# Patient Record
Sex: Male | Born: 1950 | Race: White | Hispanic: Yes | Marital: Married | Smoking: Never smoker
Health system: Southern US, Community
[De-identification: ages and names within clinical notes are randomized; demographics above are authoritative.]

## PROBLEM LIST (undated history)

## (undated) DIAGNOSIS — I209 Angina pectoris, unspecified: Secondary | ICD-10-CM

## (undated) DIAGNOSIS — F410 Panic disorder [episodic paroxysmal anxiety] without agoraphobia: Secondary | ICD-10-CM

## (undated) DIAGNOSIS — K449 Diaphragmatic hernia without obstruction or gangrene: Secondary | ICD-10-CM

## (undated) DIAGNOSIS — K760 Fatty (change of) liver, not elsewhere classified: Secondary | ICD-10-CM

## (undated) DIAGNOSIS — G4730 Sleep apnea, unspecified: Secondary | ICD-10-CM

## (undated) DIAGNOSIS — M25559 Pain in unspecified hip: Secondary | ICD-10-CM

## (undated) DIAGNOSIS — K759 Inflammatory liver disease, unspecified: Secondary | ICD-10-CM

## (undated) DIAGNOSIS — J3089 Other allergic rhinitis: Secondary | ICD-10-CM

## (undated) DIAGNOSIS — J189 Pneumonia, unspecified organism: Secondary | ICD-10-CM

## (undated) DIAGNOSIS — F4312 Post-traumatic stress disorder, chronic: Secondary | ICD-10-CM

## (undated) DIAGNOSIS — E785 Hyperlipidemia, unspecified: Secondary | ICD-10-CM

## (undated) DIAGNOSIS — F4310 Post-traumatic stress disorder, unspecified: Secondary | ICD-10-CM

## (undated) DIAGNOSIS — T8859XA Other complications of anesthesia, initial encounter: Secondary | ICD-10-CM

## (undated) DIAGNOSIS — Z87442 Personal history of urinary calculi: Secondary | ICD-10-CM

## (undated) DIAGNOSIS — F32A Depression, unspecified: Secondary | ICD-10-CM

## (undated) DIAGNOSIS — M549 Dorsalgia, unspecified: Secondary | ICD-10-CM

## (undated) DIAGNOSIS — H409 Unspecified glaucoma: Secondary | ICD-10-CM

## (undated) DIAGNOSIS — F329 Major depressive disorder, single episode, unspecified: Secondary | ICD-10-CM

## (undated) DIAGNOSIS — F40240 Claustrophobia: Secondary | ICD-10-CM

## (undated) DIAGNOSIS — M1990 Unspecified osteoarthritis, unspecified site: Secondary | ICD-10-CM

## (undated) DIAGNOSIS — I499 Cardiac arrhythmia, unspecified: Secondary | ICD-10-CM

## (undated) DIAGNOSIS — G4733 Obstructive sleep apnea (adult) (pediatric): Secondary | ICD-10-CM

## (undated) DIAGNOSIS — K219 Gastro-esophageal reflux disease without esophagitis: Secondary | ICD-10-CM

## (undated) DIAGNOSIS — E119 Type 2 diabetes mellitus without complications: Secondary | ICD-10-CM

## (undated) DIAGNOSIS — K7460 Unspecified cirrhosis of liver: Secondary | ICD-10-CM

## (undated) DIAGNOSIS — R42 Dizziness and giddiness: Secondary | ICD-10-CM

## (undated) DIAGNOSIS — Z8739 Personal history of other diseases of the musculoskeletal system and connective tissue: Secondary | ICD-10-CM

## (undated) DIAGNOSIS — I480 Paroxysmal atrial fibrillation: Secondary | ICD-10-CM

## (undated) DIAGNOSIS — M25569 Pain in unspecified knee: Secondary | ICD-10-CM

## (undated) DIAGNOSIS — R0600 Dyspnea, unspecified: Secondary | ICD-10-CM

## (undated) HISTORY — PX: BACK SURGERY: SHX140

## (undated) HISTORY — PX: REPLACEMENT TOTAL KNEE: SUR1224

## (undated) HISTORY — PX: BONE MARROW TRANSPLANT: SHX200

## (undated) HISTORY — PX: JOINT REPLACEMENT: SHX530

---
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
## 2018-01-15 ENCOUNTER — Encounter
Admission: RE | Admit: 2018-01-15 | Discharge: 2018-01-15 | Disposition: A | Discharge status: Home / Self Care | Payer: Medicare Other | Source: Ambulatory Visit | Attending: Orthopedic Surgery | Admitting: Orthopedic Surgery

## 2018-01-15 ENCOUNTER — Other Ambulatory Visit: Payer: Self-pay

## 2018-01-15 DIAGNOSIS — R9431 Abnormal electrocardiogram [ECG] [EKG]: Secondary | ICD-10-CM | POA: Diagnosis not present

## 2018-01-15 DIAGNOSIS — M1611 Unilateral primary osteoarthritis, right hip: Secondary | ICD-10-CM | POA: Diagnosis not present

## 2018-01-15 DIAGNOSIS — Z01818 Encounter for other preprocedural examination: Secondary | ICD-10-CM | POA: Insufficient documentation

## 2018-01-15 DIAGNOSIS — I4510 Unspecified right bundle-branch block: Secondary | ICD-10-CM | POA: Diagnosis not present

## 2018-01-15 DIAGNOSIS — E119 Type 2 diabetes mellitus without complications: Secondary | ICD-10-CM | POA: Diagnosis not present

## 2018-01-15 HISTORY — DX: Dorsalgia, unspecified: M54.9

## 2018-01-15 HISTORY — DX: Claustrophobia: F40.240

## 2018-01-15 HISTORY — DX: Unspecified glaucoma: H40.9

## 2018-01-15 HISTORY — DX: Type 2 diabetes mellitus without complications: E11.9

## 2018-01-15 HISTORY — DX: Dyspnea, unspecified: R06.00

## 2018-01-15 HISTORY — DX: Gastro-esophageal reflux disease without esophagitis: K21.9

## 2018-01-15 HISTORY — DX: Hyperlipidemia, unspecified: E78.5

## 2018-01-15 HISTORY — DX: Pain in unspecified knee: M25.569

## 2018-01-15 HISTORY — DX: Pain in unspecified hip: M25.559

## 2018-01-15 HISTORY — DX: Sleep apnea, unspecified: G47.30

## 2018-01-15 HISTORY — DX: Angina pectoris, unspecified: I20.9

## 2018-01-15 HISTORY — DX: Other allergic rhinitis: J30.89

## 2018-01-15 HISTORY — DX: Diaphragmatic hernia without obstruction or gangrene: K44.9

## 2018-01-15 LAB — URINALYSIS, COMPLETE (UACMP) WITH MICROSCOPIC
BILIRUBIN URINE: NEGATIVE
Bacteria, UA: NONE SEEN
Glucose, UA: NEGATIVE mg/dL
Hgb urine dipstick: NEGATIVE
KETONES UR: NEGATIVE mg/dL
LEUKOCYTES UA: NEGATIVE
Nitrite: NEGATIVE
PROTEIN: NEGATIVE mg/dL
Specific Gravity, Urine: 1.023 (ref 1.005–1.030)
pH: 6 (ref 5.0–8.0)

## 2018-01-15 LAB — CBC
HCT: 44.5 % (ref 39.0–52.0)
Hemoglobin: 15 g/dL (ref 13.0–17.0)
MCH: 30.7 pg (ref 26.0–34.0)
MCHC: 33.7 g/dL (ref 30.0–36.0)
MCV: 91 fL (ref 80.0–100.0)
PLATELETS: 224 10*3/uL (ref 150–400)
RBC: 4.89 MIL/uL (ref 4.22–5.81)
RDW: 13 % (ref 11.5–15.5)
WBC: 6.9 10*3/uL (ref 4.0–10.5)
nRBC: 0 % (ref 0.0–0.2)

## 2018-01-15 LAB — BASIC METABOLIC PANEL
Anion gap: 10 (ref 5–15)
BUN: 14 mg/dL (ref 8–23)
CALCIUM: 9.3 mg/dL (ref 8.9–10.3)
CO2: 26 mmol/L (ref 22–32)
CREATININE: 0.83 mg/dL (ref 0.61–1.24)
Chloride: 102 mmol/L (ref 98–111)
GFR calc Af Amer: >60 mL/min (ref >60)
GFR calc non Af Amer: >60 mL/min (ref >60)
GLUCOSE: 116 mg/dL — AB (ref 70–99)
Potassium: 3.6 mmol/L (ref 3.5–5.1)
SODIUM: 138 mmol/L (ref 135–145)

## 2018-01-15 LAB — TYPE AND SCREEN
ABO/RH(D): A POS
Antibody Screen: NEGATIVE

## 2018-01-15 LAB — SURGICAL PCR SCREEN
MRSA, PCR: NEGATIVE
STAPHYLOCOCCUS AUREUS: NEGATIVE

## 2018-01-15 LAB — PROTIME-INR
INR: 0.98
Prothrombin Time: 12.9 seconds (ref 11.4–15.2)

## 2018-01-15 LAB — SEDIMENTATION RATE: SED RATE: 27 mm/h — AB (ref 0–20)

## 2018-01-15 LAB — APTT: aPTT: 34 seconds (ref 24–36)

## 2018-01-15 NOTE — Patient Instructions (Signed)
Your procedure is scheduled on: 01/26/18 Tues Report to Same Day Surgery 2nd floor medical mall [facility] Entrance-take elevator on left to 2nd floor.  Check in with surgery information desk.) To find out your arrival time please call [phone] between 1PM - 3PM on 01/25/18 Mon  Remember: Instructions that are not followed completely may result in serious medical risk, up to and including death, or upon the discretion of your surgeon and anesthesiologist your surgery may need to be rescheduled.    _x___ 1. Do not eat food after midnight the night before your procedure. You may drink clear liquids up to 2 hours before you are scheduled to arrive at the hospital for your procedure.  Do not drink clear liquids within 2 hours of your scheduled arrival to the hospital.  Clear liquids include  --Water or Apple juice without pulp  --Clear carbohydrate beverage such as ClearFast or Gatorade  --Black Coffee or Clear Tea (No milk, no creamers, do not add anything to                  the coffee or Tea Type 1 and type 2 diabetics should only drink water.   ____Ensure clear carbohydrate drink on the way to the hospital for bariatric patients  ____Ensure clear carbohydrate drink 3 hours before surgery for Dr Dwyane Luo patients if physician instructed.   No gum chewing or hard candies.     __x__ 2. No Alcohol for 24 hours before or after surgery.   __x__3. No Smoking or e-cigarettes for 24 prior to surgery.  Do not use any chewable tobacco products for at least 6 hour prior to surgery   ____  4. Bring all medications with you on the day of surgery if instructed.    __x__ 5. Notify your doctor if there is any change in your medical condition     (cold, fever, infections).    x___6. On the morning of surgery brush your teeth with toothpaste and water.  You may rinse your mouth with mouth wash if you wish.  Do not swallow any toothpaste or mouthwash.   Do not wear jewelry, make-up, hairpins,  clips or nail polish.  Do not wear lotions, powders, or perfumes. You may wear deodorant.  Do not shave 48 hours prior to surgery. Men may shave face and neck.  Do not bring valuables to the hospital.    [facility] is not responsible for any belongings or valuables.               Contacts, dentures or bridgework may not be worn into surgery.  Leave your suitcase in the car. After surgery it may be brought to your room.  For patients admitted to the hospital, discharge time is determined by your                       treatment team.  _  Patients discharged the day of surgery will not be allowed to drive home.  You will need someone to drive you home and stay with you the night of your procedure.    Please read over the following fact sheets that you were given:   [facility] Preparing for Surgery and or MRSA Information   _x___ Take anti-hypertensive listed below, cardiac, seizure, asthma,     anti-reflux and psychiatric medicines. These include:  1. None  2.  3.  4.  5.  6.  ____Fleets enema or Magnesium Citrate as directed.  _x___ Use CHG Soap or sage wipes as directed on instruction sheet   ____ Use inhalers on the day of surgery and bring to hospital day of surgery  __x__ Stop Metformin and Janumet 2 days prior to surgery.    ____ Take 1/2 of usual insulin dose the night before surgery and none on the morning     surgery.   _x___ Follow recommendations from Cardiologist, Pulmonologist or PCP regarding          stopping Aspirin, Coumadin, Plavix ,Eliquis, Effient, or Pradaxa, and Pletal.  X____Stop Anti-inflammatories such as Advil, Aleve, Ibuprofen, Motrin, Naproxen, Naprosyn, Goodies powders or aspirin products. OK to take Tylenol and                          Celebrex.   _x___ Stop supplements until after surgery.  But may continue Vitamin D, Vitamin B,       and multivitamin.   __x__ Bring C-Pap to the hospital.

## 2018-01-18 LAB — URINE CULTURE: Culture: 40000 — AB

## 2018-01-19 NOTE — Pre-Procedure Instructions (Signed)
EKG OK BY DR Ola Spurr 01/18/18

## 2018-01-25 MED ORDER — DEXTROSE 5 % IV SOLN
3.0000 g | Freq: Once | INTRAVENOUS | Status: AC
  Administered 2018-01-26: 3 g via INTRAVENOUS
  Filled 2018-01-25: qty 3

## 2018-01-25 MED ORDER — TRANEXAMIC ACID-NACL 1000-0.7 MG/100ML-% IV SOLN
1000.0000 mg | INTRAVENOUS | Status: AC
  Administered 2018-01-26: 1000 mg via INTRAVENOUS
  Filled 2018-01-25: qty 100

## 2018-01-26 ENCOUNTER — Inpatient Hospital Stay: Payer: Medicare Other

## 2018-01-26 ENCOUNTER — Other Ambulatory Visit: Payer: Self-pay

## 2018-01-26 ENCOUNTER — Inpatient Hospital Stay
Admission: RE | Admit: 2018-01-26 | Discharge: 2018-01-29 | DRG: 470 | Disposition: A | Discharge status: Home Health | Payer: Medicare Other | Attending: Orthopedic Surgery | Admitting: Orthopedic Surgery

## 2018-01-26 ENCOUNTER — Encounter
Admission: RE | Disposition: A | Discharge status: Home Health | Payer: Self-pay | Source: Home / Self Care | Attending: Orthopedic Surgery

## 2018-01-26 ENCOUNTER — Other Ambulatory Visit: Payer: Self-pay | Admitting: Gastroenterology

## 2018-01-26 ENCOUNTER — Inpatient Hospital Stay: Payer: Medicare Other | Admitting: Anesthesiology

## 2018-01-26 DIAGNOSIS — F329 Major depressive disorder, single episode, unspecified: Secondary | ICD-10-CM | POA: Diagnosis present

## 2018-01-26 DIAGNOSIS — Z96641 Presence of right artificial hip joint: Secondary | ICD-10-CM | POA: Diagnosis present

## 2018-01-26 DIAGNOSIS — Z419 Encounter for procedure for purposes other than remedying health state, unspecified: Secondary | ICD-10-CM

## 2018-01-26 DIAGNOSIS — R1084 Generalized abdominal pain: Secondary | ICD-10-CM

## 2018-01-26 DIAGNOSIS — Z23 Encounter for immunization: Secondary | ICD-10-CM

## 2018-01-26 DIAGNOSIS — R509 Fever, unspecified: Secondary | ICD-10-CM | POA: Diagnosis not present

## 2018-01-26 DIAGNOSIS — Z881 Allergy status to other antibiotic agents status: Secondary | ICD-10-CM | POA: Diagnosis not present

## 2018-01-26 DIAGNOSIS — F419 Anxiety disorder, unspecified: Secondary | ICD-10-CM | POA: Diagnosis present

## 2018-01-26 DIAGNOSIS — M1611 Unilateral primary osteoarthritis, right hip: Principal | ICD-10-CM | POA: Diagnosis present

## 2018-01-26 DIAGNOSIS — K219 Gastro-esophageal reflux disease without esophagitis: Secondary | ICD-10-CM | POA: Diagnosis present

## 2018-01-26 DIAGNOSIS — F40240 Claustrophobia: Secondary | ICD-10-CM | POA: Diagnosis present

## 2018-01-26 DIAGNOSIS — Z87442 Personal history of urinary calculi: Secondary | ICD-10-CM

## 2018-01-26 DIAGNOSIS — H409 Unspecified glaucoma: Secondary | ICD-10-CM | POA: Diagnosis present

## 2018-01-26 DIAGNOSIS — E119 Type 2 diabetes mellitus without complications: Secondary | ICD-10-CM | POA: Diagnosis present

## 2018-01-26 DIAGNOSIS — R110 Nausea: Secondary | ICD-10-CM

## 2018-01-26 DIAGNOSIS — K5909 Other constipation: Secondary | ICD-10-CM

## 2018-01-26 DIAGNOSIS — Z8042 Family history of malignant neoplasm of prostate: Secondary | ICD-10-CM

## 2018-01-26 DIAGNOSIS — E662 Morbid (severe) obesity with alveolar hypoventilation: Secondary | ICD-10-CM | POA: Diagnosis present

## 2018-01-26 DIAGNOSIS — Z6841 Body Mass Index (BMI) 40.0 and over, adult: Secondary | ICD-10-CM | POA: Diagnosis not present

## 2018-01-26 DIAGNOSIS — Z833 Family history of diabetes mellitus: Secondary | ICD-10-CM | POA: Diagnosis not present

## 2018-01-26 DIAGNOSIS — G8918 Other acute postprocedural pain: Secondary | ICD-10-CM

## 2018-01-26 DIAGNOSIS — Z79899 Other long term (current) drug therapy: Secondary | ICD-10-CM | POA: Diagnosis not present

## 2018-01-26 DIAGNOSIS — Z8249 Family history of ischemic heart disease and other diseases of the circulatory system: Secondary | ICD-10-CM

## 2018-01-26 DIAGNOSIS — Z7984 Long term (current) use of oral hypoglycemic drugs: Secondary | ICD-10-CM | POA: Diagnosis not present

## 2018-01-26 HISTORY — PX: TOTAL HIP ARTHROPLASTY: SHX124

## 2018-01-26 LAB — GLUCOSE, CAPILLARY
Glucose-Capillary: 140 mg/dL — ABNORMAL HIGH (ref 70–99)
Glucose-Capillary: 132 mg/dL — ABNORMAL HIGH (ref 70–99)
Glucose-Capillary: 122 mg/dL — ABNORMAL HIGH (ref 70–99)
Glucose-Capillary: 217 mg/dL — ABNORMAL HIGH (ref 70–99)

## 2018-01-26 LAB — ABO/RH: ABO/RH(D): A POS

## 2018-01-26 IMAGING — DX DG HIP (WITH OR WITHOUT PELVIS) 2-3V*R*
2 series · 2 of 2 positions shown · non-contrast
Comparison: None.

CLINICAL DATA: Status post right hip replacement.

EXAM:
DG HIP (WITH OR WITHOUT PELVIS) 2-3V RIGHT

[hip ap]
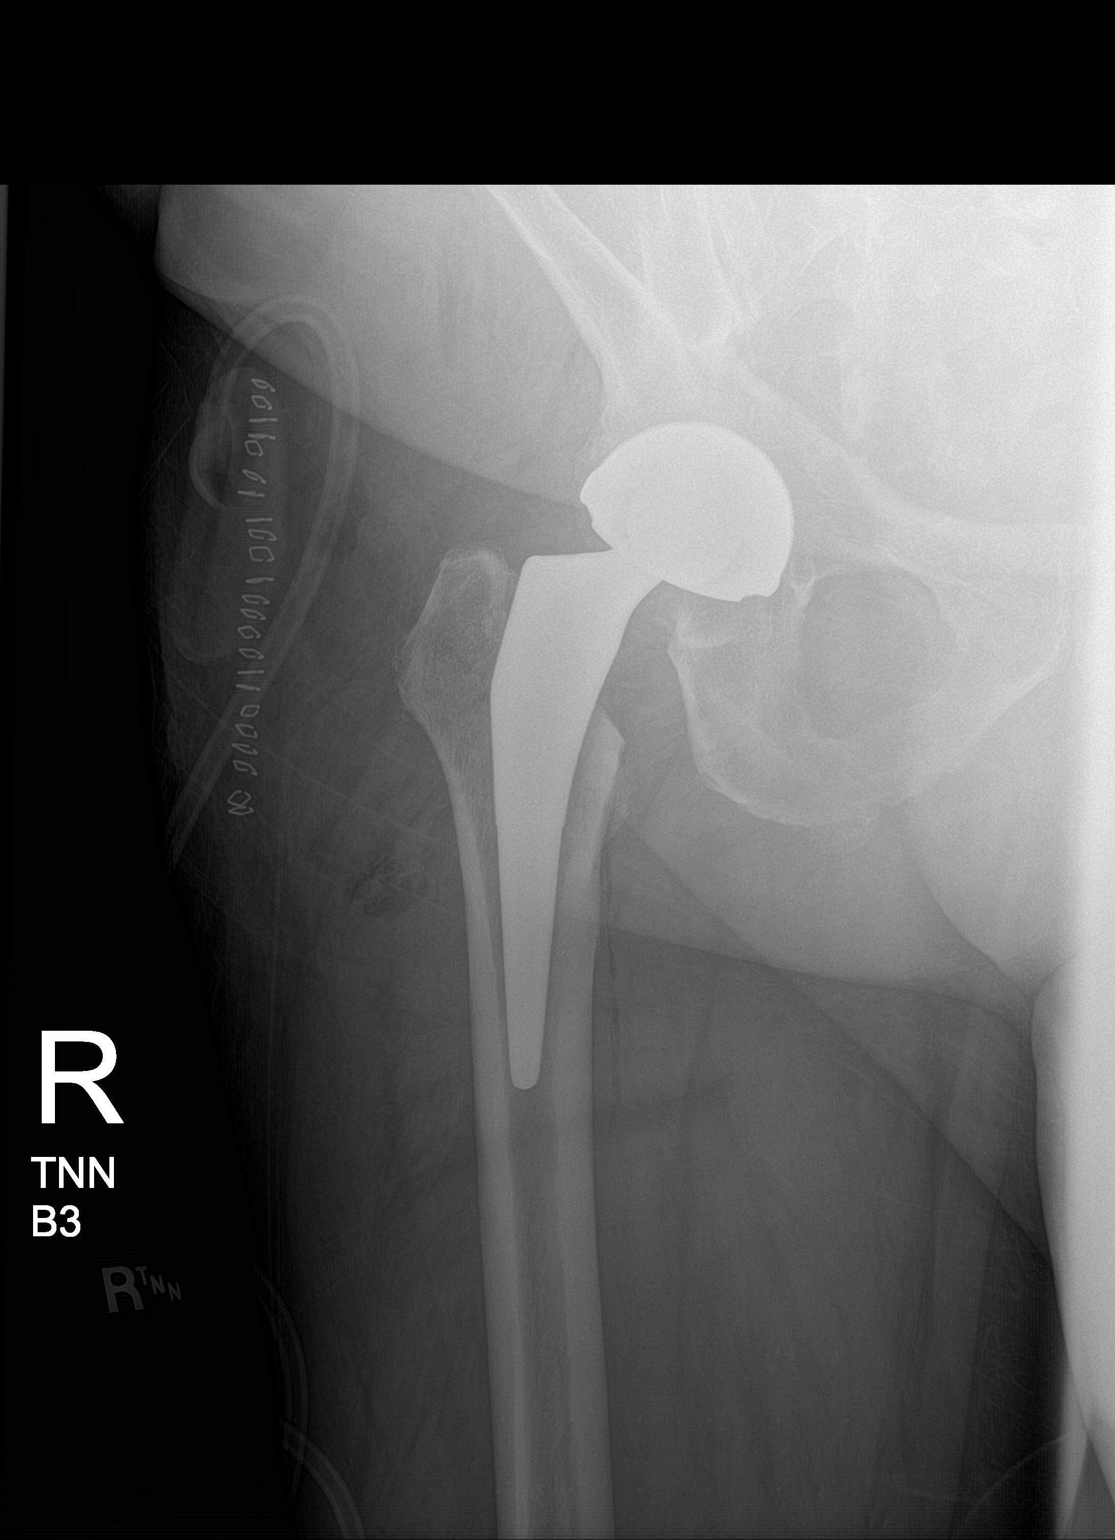

[hip lat]
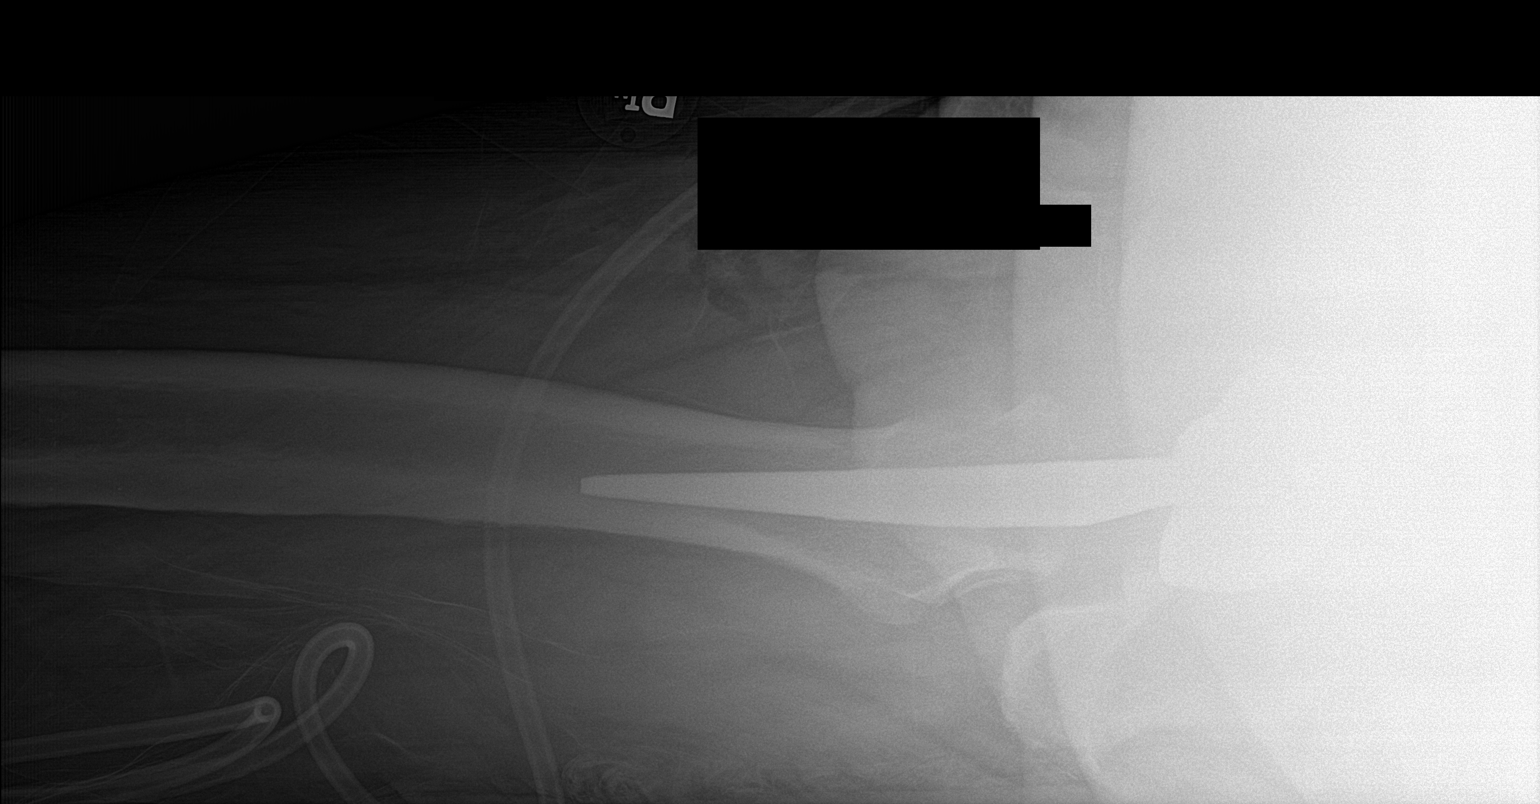

[2 of 2 positions shown; findings below may reference images not displayed]

FINDINGS: The femoral and acetabular components appear to be well situated. No
fracture or dislocation is noted. Expected postoperative changes are
noted in the surrounding soft tissues.
IMPRESSION: Status post right total hip arthroplasty.

## 2018-01-26 IMAGING — XA DG HIP (WITH PELVIS) OPERATIVE*R*
3 series · 10 of 10 positions shown · non-contrast
Comparison: None.

CLINICAL DATA: Status post right total hip joint prosthesis
placement.

EXAM:
OPERATIVE right HIP (WITH PELVIS IF PERFORMED) 3 VIEWS
TECHNIQUE: Fluoroscopic spot image(s) were submitted for interpretation
post-operatively.

[Series 1: ortho standard · 3 of 10 frames shown (1 of 3)]
[frame 2/10]
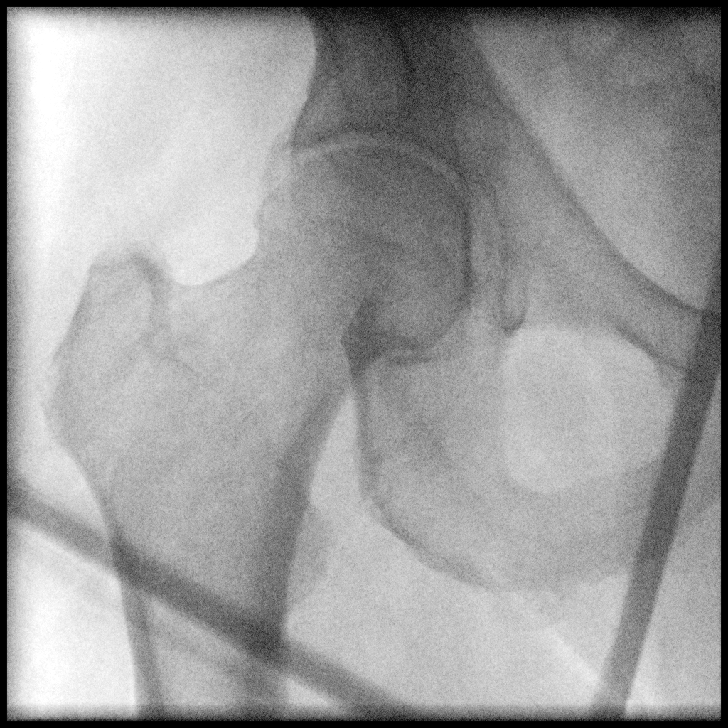
[frame 6/10]
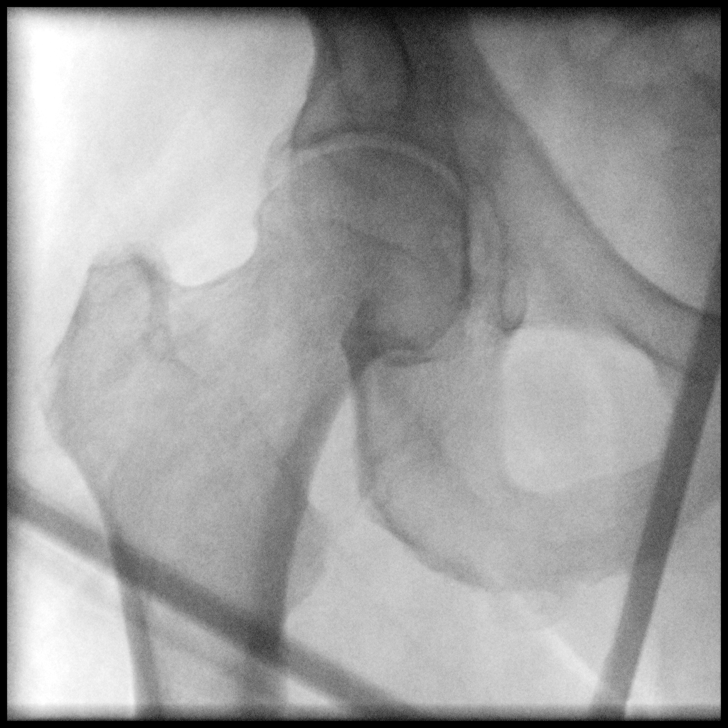
[frame 9/10]
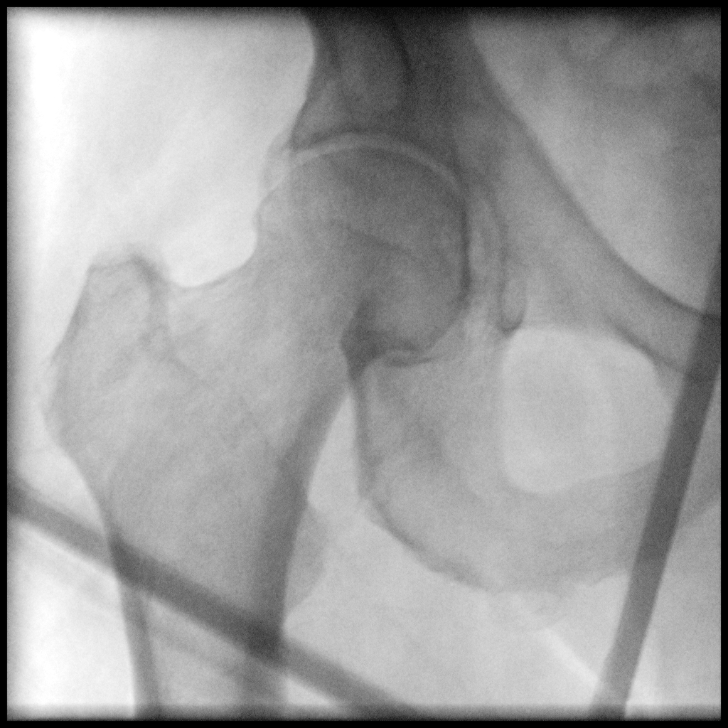

[Series 3: ortho standard · 3 of 10 frames shown (2 of 3)]
[frame 2/10]
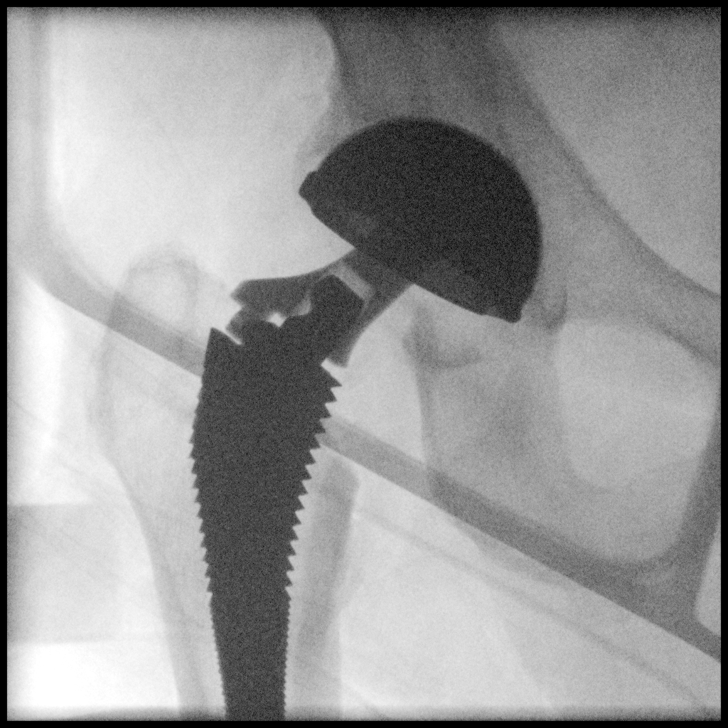
[frame 6/10]
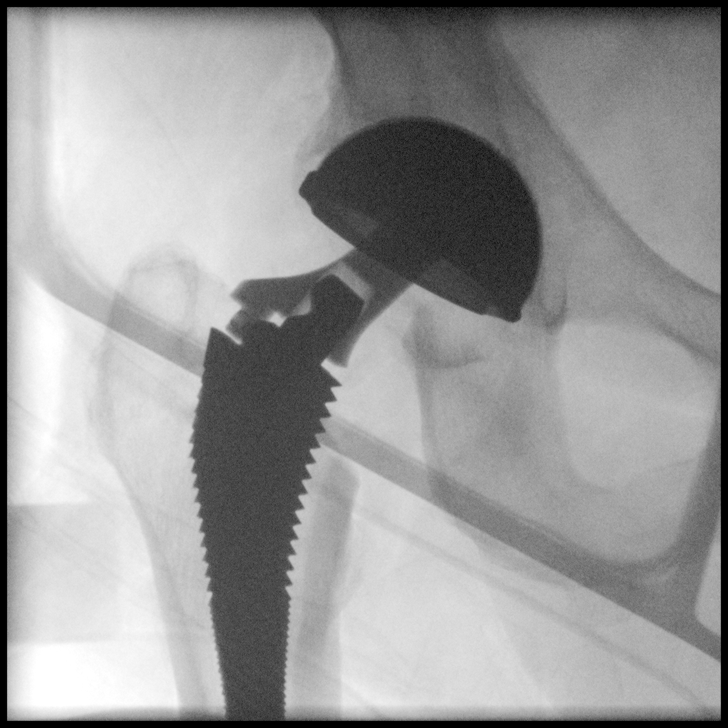
[frame 9/10]
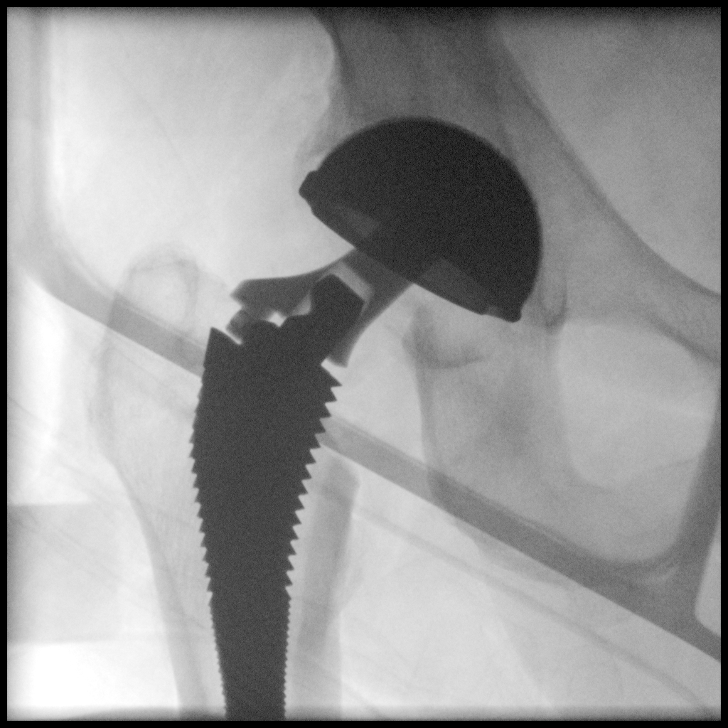

[Series 4: ortho standard · 4 of 9 frames shown (3 of 3)]
[frame 2/9]
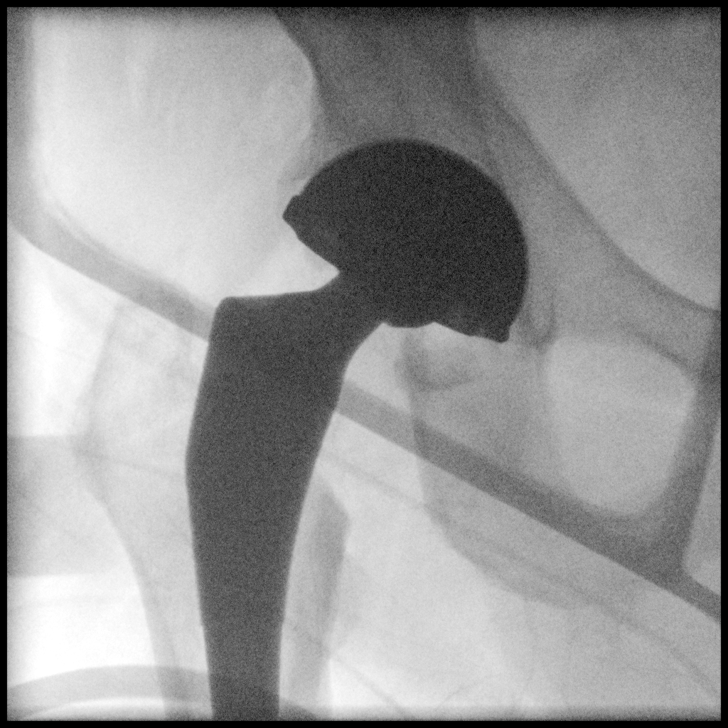
[frame 5/9]
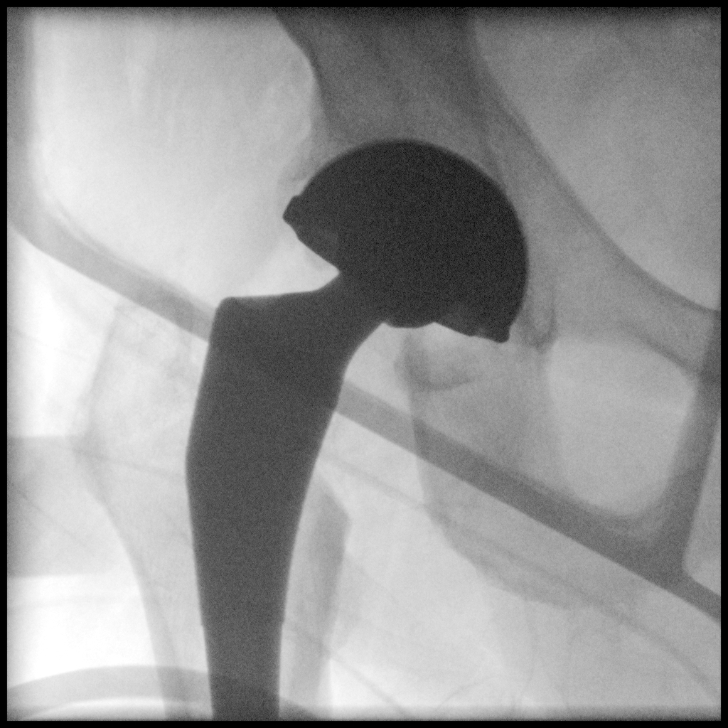
[frame 8/9]
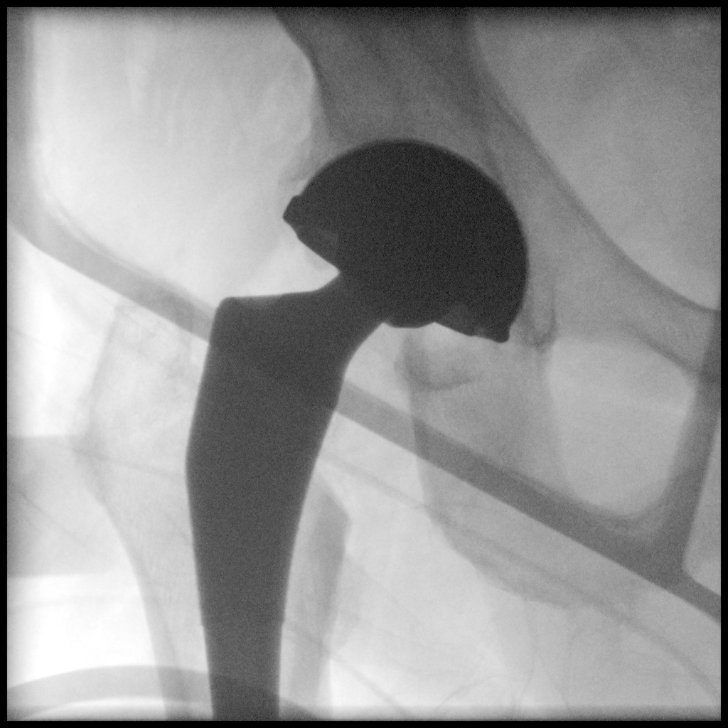
[frame 9/9]
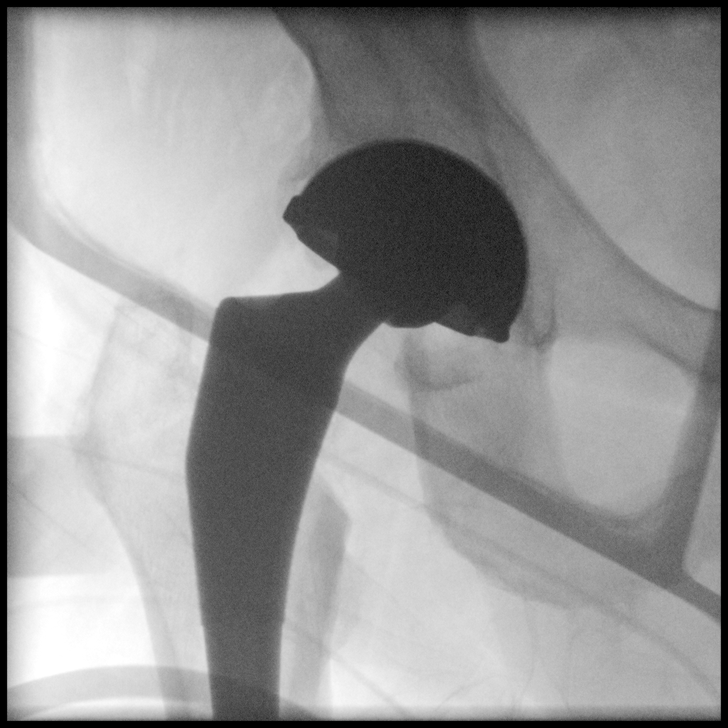

[10 of 10 positions shown; findings below may reference images not displayed]

FINDINGS: Reported fluoro time is 12 seconds. The patient has undergone right
total knee joint prosthesis placement. Radiographic positioning of
the visualized prosthetic components is good. The interface with the
native bone is normal where visualized.
IMPRESSION: No immediate complication following right hip joint prosthesis
placement.

## 2018-01-26 SURGERY — ARTHROPLASTY, HIP, TOTAL, ANTERIOR APPROACH
Anesthesia: Spinal | Site: Hip | Laterality: Right

## 2018-01-26 MED ORDER — KETAMINE HCL 50 MG/ML IJ SOLN
INTRAMUSCULAR | Status: DC | PRN
  Administered 2018-01-26: 50 mg via INTRAVENOUS
  Administered 2018-01-26: 20 mg via INTRAMUSCULAR

## 2018-01-26 MED ORDER — BUPIVACAINE HCL (PF) 0.5 % IJ SOLN
INTRAMUSCULAR | Status: DC | PRN
  Administered 2018-01-26: 3 mL via INTRATHECAL

## 2018-01-26 MED ORDER — OXYCODONE HCL 5 MG PO TABS
5.0000 mg | ORAL_TABLET | ORAL | Status: DC | PRN
  Administered 2018-01-26 – 2018-01-27 (×2): 5 mg via ORAL
  Administered 2018-01-27 – 2018-01-28 (×3): 10 mg via ORAL
  Filled 2018-01-26: qty 2
  Filled 2018-01-26: qty 1
  Filled 2018-01-26: qty 2
  Filled 2018-01-26: qty 1
  Filled 2018-01-26: qty 2

## 2018-01-26 MED ORDER — LORAZEPAM 2 MG PO TABS
2.0000 mg | ORAL_TABLET | Freq: Once | ORAL | Status: AC
  Administered 2018-01-26: 2 mg via ORAL
  Filled 2018-01-26: qty 1

## 2018-01-26 MED ORDER — DIPHENHYDRAMINE HCL 12.5 MG/5ML PO ELIX
12.5000 mg | ORAL_SOLUTION | ORAL | Status: DC | PRN
  Filled 2018-01-26: qty 10

## 2018-01-26 MED ORDER — PROPOFOL 10 MG/ML IV BOLUS
INTRAVENOUS | Status: AC
  Filled 2018-01-26: qty 20

## 2018-01-26 MED ORDER — FAMOTIDINE 20 MG PO TABS: 20.0000 mg | ORAL_TABLET | Freq: Once | ORAL | Status: DC

## 2018-01-26 MED ORDER — ASPIRIN 81 MG PO CHEW
81.0000 mg | CHEWABLE_TABLET | Freq: Two times a day (BID) | ORAL | Status: DC
  Administered 2018-01-27 – 2018-01-29 (×5): 81 mg via ORAL
  Filled 2018-01-26 (×5): qty 1

## 2018-01-26 MED ORDER — INSULIN ASPART 100 UNIT/ML ~~LOC~~ SOLN
0.0000 [IU] | Freq: Three times a day (TID) | SUBCUTANEOUS | Status: DC
  Administered 2018-01-26: 2 [IU] via SUBCUTANEOUS
  Administered 2018-01-27: 3 [IU] via SUBCUTANEOUS
  Administered 2018-01-27 – 2018-01-28 (×2): 2 [IU] via SUBCUTANEOUS
  Filled 2018-01-26 (×4): qty 1

## 2018-01-26 MED ORDER — METOCLOPRAMIDE HCL 10 MG PO TABS: 5.0000 mg | ORAL_TABLET | Freq: Three times a day (TID) | ORAL | Status: DC | PRN

## 2018-01-26 MED ORDER — MAGNESIUM CITRATE PO SOLN
1.0000 | Freq: Once | ORAL | Status: DC | PRN
  Filled 2018-01-26: qty 296

## 2018-01-26 MED ORDER — METHOCARBAMOL 1000 MG/10ML IJ SOLN
500.0000 mg | Freq: Four times a day (QID) | INTRAVENOUS | Status: DC | PRN
  Filled 2018-01-26: qty 5

## 2018-01-26 MED ORDER — NEOMYCIN-POLYMYXIN B GU 40-200000 IR SOLN
Status: DC | PRN
  Administered 2018-01-26: 4 mL

## 2018-01-26 MED ORDER — ZOLPIDEM TARTRATE 5 MG PO TABS: 5.0000 mg | ORAL_TABLET | Freq: Every evening | ORAL | Status: DC | PRN

## 2018-01-26 MED ORDER — FLUTICASONE PROPIONATE 50 MCG/ACT NA SUSP
2.0000 | NASAL | Status: DC
  Administered 2018-01-27: 2 via NASAL
  Filled 2018-01-26: qty 16

## 2018-01-26 MED ORDER — GLYCOPYRROLATE 0.2 MG/ML IJ SOLN
INTRAMUSCULAR | Status: DC | PRN
  Administered 2018-01-26: 0.2 mg via INTRAVENOUS

## 2018-01-26 MED ORDER — PNEUMOCOCCAL VAC POLYVALENT 25 MCG/0.5ML IJ INJ
0.5000 mL | INJECTION | INTRAMUSCULAR | Status: AC
  Administered 2018-01-29: 0.5 mL via INTRAMUSCULAR
  Filled 2018-01-26: qty 0.5

## 2018-01-26 MED ORDER — METFORMIN HCL 500 MG PO TABS
1000.0000 mg | ORAL_TABLET | Freq: Two times a day (BID) | ORAL | Status: DC
  Administered 2018-01-26 – 2018-01-29 (×6): 1000 mg via ORAL
  Filled 2018-01-26 (×7): qty 2

## 2018-01-26 MED ORDER — CALCIUM-MAGNESIUM-ZINC 333-133-5 MG PO TABS: 1.0000 | ORAL_TABLET | Freq: Every day | ORAL | Status: DC

## 2018-01-26 MED ORDER — FENTANYL CITRATE (PF) 100 MCG/2ML IJ SOLN: 25.0000 ug | INTRAMUSCULAR | Status: DC | PRN

## 2018-01-26 MED ORDER — ALUM & MAG HYDROXIDE-SIMETH 200-200-20 MG/5ML PO SUSP: 30.0000 mL | ORAL | Status: DC | PRN

## 2018-01-26 MED ORDER — EPHEDRINE SULFATE 50 MG/ML IJ SOLN
INTRAMUSCULAR | Status: AC
  Filled 2018-01-26: qty 1

## 2018-01-26 MED ORDER — DEXTROSE 5 % IV SOLN
3.0000 g | Freq: Four times a day (QID) | INTRAVENOUS | Status: AC
  Administered 2018-01-26 – 2018-01-27 (×3): 3 g via INTRAVENOUS
  Filled 2018-01-26 (×3): qty 3000

## 2018-01-26 MED ORDER — ACETAMINOPHEN 325 MG PO TABS: 325.0000 mg | ORAL_TABLET | Freq: Four times a day (QID) | ORAL | Status: DC | PRN

## 2018-01-26 MED ORDER — PHENYLEPHRINE HCL 10 MG/ML IJ SOLN
INTRAMUSCULAR | Status: DC | PRN
  Administered 2018-01-26 (×2): 100 ug via INTRAVENOUS
  Administered 2018-01-26: 50 ug via INTRAVENOUS

## 2018-01-26 MED ORDER — TRAZODONE HCL 100 MG PO TABS
100.0000 mg | ORAL_TABLET | Freq: Every day | ORAL | Status: DC
  Administered 2018-01-26 – 2018-01-28 (×3): 100 mg via ORAL
  Filled 2018-01-26 (×3): qty 1

## 2018-01-26 MED ORDER — FENTANYL CITRATE (PF) 100 MCG/2ML IJ SOLN
INTRAMUSCULAR | Status: AC
  Filled 2018-01-26: qty 2

## 2018-01-26 MED ORDER — PRAVASTATIN SODIUM 20 MG PO TABS
40.0000 mg | ORAL_TABLET | Freq: Every day | ORAL | Status: DC
  Administered 2018-01-26 – 2018-01-29 (×4): 40 mg via ORAL
  Filled 2018-01-26 (×4): qty 2

## 2018-01-26 MED ORDER — ONDANSETRON HCL 4 MG/2ML IJ SOLN: 4.0000 mg | Freq: Four times a day (QID) | INTRAMUSCULAR | Status: DC | PRN

## 2018-01-26 MED ORDER — FENTANYL CITRATE (PF) 100 MCG/2ML IJ SOLN
INTRAMUSCULAR | Status: DC | PRN
  Administered 2018-01-26 (×2): 50 ug via INTRAVENOUS

## 2018-01-26 MED ORDER — MAGNESIUM HYDROXIDE 400 MG/5ML PO SUSP
30.0000 mL | Freq: Every day | ORAL | Status: DC | PRN
  Administered 2018-01-27 – 2018-01-28 (×2): 30 mL via ORAL
  Filled 2018-01-26 (×2): qty 30

## 2018-01-26 MED ORDER — GABAPENTIN 300 MG PO CAPS
300.0000 mg | ORAL_CAPSULE | Freq: Three times a day (TID) | ORAL | Status: DC
  Administered 2018-01-26 – 2018-01-29 (×8): 300 mg via ORAL
  Filled 2018-01-26 (×8): qty 1

## 2018-01-26 MED ORDER — ONDANSETRON HCL 4 MG/2ML IJ SOLN
INTRAMUSCULAR | Status: AC
  Filled 2018-01-26: qty 2

## 2018-01-26 MED ORDER — ZINC 15 MG PO CAPS: 1.0000 | ORAL_CAPSULE | Freq: Every day | ORAL | Status: DC

## 2018-01-26 MED ORDER — MAGNESIUM OXIDE 400 (241.3 MG) MG PO TABS
ORAL_TABLET | Freq: Every day | ORAL | Status: DC
  Administered 2018-01-27 – 2018-01-29 (×3): 400 mg via ORAL
  Filled 2018-01-26 (×3): qty 1

## 2018-01-26 MED ORDER — OXYCODONE HCL 5 MG PO TABS
10.0000 mg | ORAL_TABLET | ORAL | Status: DC | PRN
  Administered 2018-01-26 – 2018-01-28 (×4): 10 mg via ORAL
  Filled 2018-01-26 (×5): qty 2

## 2018-01-26 MED ORDER — MIDAZOLAM HCL 2 MG/2ML IJ SOLN
INTRAMUSCULAR | Status: AC
  Filled 2018-01-26: qty 2

## 2018-01-26 MED ORDER — EPHEDRINE SULFATE 50 MG/ML IJ SOLN
INTRAMUSCULAR | Status: DC | PRN
  Administered 2018-01-26: 10 mg via INTRAVENOUS

## 2018-01-26 MED ORDER — BUPIVACAINE-EPINEPHRINE 0.25% -1:200000 IJ SOLN
INTRAMUSCULAR | Status: DC | PRN
  Administered 2018-01-26: 30 mL

## 2018-01-26 MED ORDER — DOCUSATE SODIUM 100 MG PO CAPS
100.0000 mg | ORAL_CAPSULE | Freq: Two times a day (BID) | ORAL | Status: DC
  Administered 2018-01-26 – 2018-01-29 (×6): 100 mg via ORAL
  Filled 2018-01-26 (×6): qty 1

## 2018-01-26 MED ORDER — MIDAZOLAM HCL 5 MG/5ML IJ SOLN
INTRAMUSCULAR | Status: DC | PRN
  Administered 2018-01-26: 2 mg via INTRAVENOUS

## 2018-01-26 MED ORDER — METOCLOPRAMIDE HCL 5 MG/ML IJ SOLN: 5.0000 mg | Freq: Three times a day (TID) | INTRAMUSCULAR | Status: DC | PRN

## 2018-01-26 MED ORDER — SODIUM CHLORIDE 0.9 % IV SOLN
INTRAVENOUS | Status: DC
  Administered 2018-01-26: 12:00:00 via INTRAVENOUS

## 2018-01-26 MED ORDER — GLIMEPIRIDE 4 MG PO TABS
4.0000 mg | ORAL_TABLET | Freq: Every day | ORAL | Status: DC
  Administered 2018-01-26 – 2018-01-28 (×3): 4 mg via ORAL
  Filled 2018-01-26 (×4): qty 1

## 2018-01-26 MED ORDER — LORAZEPAM 2 MG PO TABS
2.0000 mg | ORAL_TABLET | ORAL | Status: DC | PRN
  Administered 2018-01-26 – 2018-01-29 (×4): 2 mg via ORAL
  Filled 2018-01-26 (×5): qty 1

## 2018-01-26 MED ORDER — METHOCARBAMOL 500 MG PO TABS
500.0000 mg | ORAL_TABLET | Freq: Four times a day (QID) | ORAL | Status: DC | PRN
  Administered 2018-01-26 – 2018-01-27 (×2): 500 mg via ORAL
  Filled 2018-01-26 (×2): qty 1

## 2018-01-26 MED ORDER — BUPIVACAINE HCL (PF) 0.5 % IJ SOLN
INTRAMUSCULAR | Status: AC
  Filled 2018-01-26: qty 10

## 2018-01-26 MED ORDER — ONDANSETRON HCL 4 MG/2ML IJ SOLN: 4.0000 mg | Freq: Once | INTRAMUSCULAR | Status: DC | PRN

## 2018-01-26 MED ORDER — PROPOFOL 500 MG/50ML IV EMUL
INTRAVENOUS | Status: DC | PRN
  Administered 2018-01-26: 50 ug/kg/min via INTRAVENOUS

## 2018-01-26 MED ORDER — HYDROMORPHONE HCL 1 MG/ML IJ SOLN
0.5000 mg | INTRAMUSCULAR | Status: DC | PRN
  Administered 2018-01-26 (×2): 1 mg via INTRAVENOUS
  Filled 2018-01-26 (×2): qty 1

## 2018-01-26 MED ORDER — CYANOCOBALAMIN 500 MCG PO TABS
2500.0000 ug | ORAL_TABLET | Freq: Every day | ORAL | Status: DC
  Administered 2018-01-27 – 2018-01-29 (×3): 2500 ug via ORAL
  Filled 2018-01-26 (×4): qty 5

## 2018-01-26 MED ORDER — BISACODYL 5 MG PO TBEC
5.0000 mg | DELAYED_RELEASE_TABLET | Freq: Every day | ORAL | Status: DC | PRN
  Administered 2018-01-27 – 2018-01-29 (×2): 5 mg via ORAL
  Filled 2018-01-26 (×2): qty 1

## 2018-01-26 MED ORDER — ACETAMINOPHEN 500 MG PO TABS
1000.0000 mg | ORAL_TABLET | Freq: Four times a day (QID) | ORAL | Status: AC
  Administered 2018-01-26 – 2018-01-27 (×3): 1000 mg via ORAL
  Filled 2018-01-26 (×3): qty 2

## 2018-01-26 MED ORDER — PHENOL 1.4 % MT LIQD
1.0000 | OROMUCOSAL | Status: DC | PRN
  Filled 2018-01-26: qty 177

## 2018-01-26 MED ORDER — MENTHOL 3 MG MT LOZG
1.0000 | LOZENGE | OROMUCOSAL | Status: DC | PRN
  Filled 2018-01-26: qty 9

## 2018-01-26 MED ORDER — SODIUM CHLORIDE 0.9 % IV SOLN
INTRAVENOUS | Status: DC
  Administered 2018-01-26: 16:00:00 via INTRAVENOUS

## 2018-01-26 MED ORDER — PROPOFOL 10 MG/ML IV BOLUS
INTRAVENOUS | Status: DC | PRN
  Administered 2018-01-26: 50 mg via INTRAVENOUS

## 2018-01-26 MED ORDER — ONDANSETRON HCL 4 MG PO TABS: 4.0000 mg | ORAL_TABLET | Freq: Four times a day (QID) | ORAL | Status: DC | PRN

## 2018-01-26 SURGICAL SUPPLY — 58 items
BLADE SAGITTAL AGGR TOOTH XLG (BLADE) ×2 IMPLANT
BNDG COHESIVE 6X5 TAN STRL LF (GAUZE/BANDAGES/DRESSINGS) ×6 IMPLANT
CANISTER SUCT 1200ML W/VALVE (MISCELLANEOUS) ×2 IMPLANT
CHLORAPREP W/TINT 26ML (MISCELLANEOUS) ×2 IMPLANT
COVER WAND RF STERILE (DRAPES) ×2 IMPLANT
DRAPE C-ARM XRAY 36X54 (DRAPES) ×2 IMPLANT
DRAPE INCISE IOBAN 66X60 STRL (DRAPES) IMPLANT
DRAPE POUCH INSTRU U-SHP 10X18 (DRAPES) ×2 IMPLANT
DRAPE SHEET LG 3/4 BI-LAMINATE (DRAPES) ×6 IMPLANT
DRAPE TABLE BACK 80X90 (DRAPES) ×2 IMPLANT
DRESSING SURGICEL FIBRLLR 1X2 (HEMOSTASIS) ×2 IMPLANT
DRSG OPSITE POSTOP 4X8 (GAUZE/BANDAGES/DRESSINGS) ×4 IMPLANT
DRSG SURGICEL FIBRILLAR 1X2 (HEMOSTASIS) ×4
ELECT BLADE 6.5 EXT (BLADE) ×2 IMPLANT
ELECT REM PT RETURN 9FT ADLT (ELECTROSURGICAL) ×2
ELECTRODE REM PT RTRN 9FT ADLT (ELECTROSURGICAL) ×1 IMPLANT
GLOVE BIOGEL PI IND STRL 9 (GLOVE) ×1 IMPLANT
GLOVE BIOGEL PI INDICATOR 9 (GLOVE) ×1
GLOVE SURG SYN 9.0  PF PI (GLOVE) ×2
GLOVE SURG SYN 9.0 PF PI (GLOVE) ×2 IMPLANT
GOWN SRG 2XL LVL 4 RGLN SLV (GOWNS) ×1 IMPLANT
GOWN STRL NON-REIN 2XL LVL4 (GOWNS) ×1
GOWN STRL REUS W/ TWL LRG LVL3 (GOWN DISPOSABLE) ×1 IMPLANT
GOWN STRL REUS W/TWL LRG LVL3 (GOWN DISPOSABLE) ×1
HEAD FEMORAL 28MM SZ S (Head) ×2 IMPLANT
HEMOVAC 400CC 10FR (MISCELLANEOUS) IMPLANT
HOLDER FOLEY CATH W/STRAP (MISCELLANEOUS) ×2 IMPLANT
HOOD PEEL AWAY FLYTE STAYCOOL (MISCELLANEOUS) ×2 IMPLANT
KIT PREVENA INCISION MGT 13 (CANNISTER) ×2 IMPLANT
LINER DBL MOB SZ 0 52MM (Liner) ×2 IMPLANT
MASTERLOC HIP LATERAL S5 (Hips) ×2 IMPLANT
MAT ABSORB  FLUID 56X50 GRAY (MISCELLANEOUS) ×1
MAT ABSORB FLUID 56X50 GRAY (MISCELLANEOUS) ×1 IMPLANT
NDL SAFETY ECLIPSE 18X1.5 (NEEDLE) ×1 IMPLANT
NEEDLE HYPO 18GX1.5 SHARP (NEEDLE) ×1
NEEDLE SPNL 18GX3.5 QUINCKE PK (NEEDLE) ×2 IMPLANT
NS IRRIG 1000ML POUR BTL (IV SOLUTION) ×2 IMPLANT
PACK HIP COMPR (MISCELLANEOUS) ×2 IMPLANT
SCALPEL PROTECTED #10 DISP (BLADE) ×4 IMPLANT
SHELL ACETABULAR SZ 52 DM (Shell) ×2 IMPLANT
SOL PREP PVP 2OZ (MISCELLANEOUS) ×2
SOLUTION PREP PVP 2OZ (MISCELLANEOUS) ×1 IMPLANT
SPONGE DRAIN TRACH 4X4 STRL 2S (GAUZE/BANDAGES/DRESSINGS) ×2 IMPLANT
STAPLER SKIN PROX 35W (STAPLE) ×2 IMPLANT
STRAP SAFETY 5IN WIDE (MISCELLANEOUS) ×2 IMPLANT
SUT DVC 2 QUILL PDO  T11 36X36 (SUTURE) ×1
SUT DVC 2 QUILL PDO T11 36X36 (SUTURE) ×1 IMPLANT
SUT SILK 0 (SUTURE) ×1
SUT SILK 0 30XBRD TIE 6 (SUTURE) ×1 IMPLANT
SUT V-LOC 90 ABS DVC 3-0 CL (SUTURE) ×2 IMPLANT
SUT VIC AB 1 CT1 36 (SUTURE) ×2 IMPLANT
SYR 20CC LL (SYRINGE) ×2 IMPLANT
SYR 30ML LL (SYRINGE) ×2 IMPLANT
SYR BULB IRRIG 60ML STRL (SYRINGE) ×2 IMPLANT
TAPE MICROFOAM 4IN (TAPE) ×2 IMPLANT
TOWEL OR 17X26 4PK STRL BLUE (TOWEL DISPOSABLE) ×2 IMPLANT
TRAY FOLEY MTR SLVR 16FR STAT (SET/KITS/TRAYS/PACK) ×2 IMPLANT
WND VAC CANISTER 500ML (MISCELLANEOUS) ×2 IMPLANT

## 2018-01-26 NOTE — Transfer of Care (Signed)
Immediate Anesthesia Transfer of Care Note  Patient: Mario [name]  Procedure(s) Performed: TOTAL HIP ARTHROPLASTY ANTERIOR APPROACH (Right Hip)  Patient Location: PACU  Anesthesia Type:Spinal  Level of Consciousness: awake and alert   Airway & Oxygen Therapy: Patient Spontanous Breathing and Patient connected to face mask oxygen  Post-op Assessment: Report given to RN and Post -op Vital signs reviewed and stable  Post vital signs: Reviewed  Last Vitals:  Vitals Value Taken Time  BP 107/54 01/26/2018  1:54 PM  Temp    Pulse 88 01/26/2018  1:54 PM  Resp 18 01/26/2018  1:54 PM  SpO2 98 % 01/26/2018  1:54 PM  Vitals shown include unvalidated device data.  Last Pain:  Vitals:   01/26/18 1116  TempSrc: Temporal  PainSc: 5          Complications: No apparent anesthesia complications

## 2018-01-26 NOTE — Anesthesia Post-op Follow-up Note (Signed)
Anesthesia QCDR form completed.        

## 2018-01-26 NOTE — Anesthesia Preprocedure Evaluation (Signed)
Anesthesia Evaluation  Patient identified by MRN, date of birth, ID band Patient awake    Reviewed: Allergy & Precautions, H&P , NPO status , Patient's Chart, lab work & pertinent test results, reviewed documented beta blocker date and time   Airway Mallampati: II   Neck ROM: full    Dental  (+) Poor Dentition   Pulmonary neg pulmonary ROS, shortness of breath, sleep apnea ,    Pulmonary exam normal        Cardiovascular + angina with exertion negative cardio ROS Normal cardiovascular exam Rhythm:regular Rate:Normal     Neuro/Psych Anxiety negative neurological ROS  negative psych ROS   GI/Hepatic negative GI ROS, Neg liver ROS, hiatal hernia, GERD  Medicated,  Endo/Other  negative endocrine ROSdiabetes, Well Controlled, Type 2, Oral Hypoglycemic Agents  Renal/GU negative Renal ROS  negative genitourinary   Musculoskeletal   Abdominal   Peds  Hematology negative hematology ROS (+)   Anesthesia Other Findings Past Medical History: No date: Anginal pain (HCC) No date: Back pain     Comment:  lower No date: Claustrophobia No date: Diabetes mellitus without complication (HCC) No date: Dyspnea No date: Elevated lipids No date: Environmental and seasonal allergies No date: GERD (gastroesophageal reflux disease) No date: Glaucoma No date: HH (hiatus hernia) Right: Hip pain No date: Knee pain     Comment:  right No date: Sleep apnea Past Surgical History: No date: BACK SURGERY     Comment:  L4-L5 fusion 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 No date: REPLACEMENT TOTAL KNEE; Left   Reproductive/Obstetrics negative OB ROS                             Anesthesia Physical Anesthesia Plan  ASA: III  Anesthesia Plan: General and Spinal   Post-op Pain Management:    Induction:   PONV Risk Score and Plan:   Airway Management Planned:   Additional Equipment:   Intra-op Plan:    Post-operative Plan:   Informed Consent: I have reviewed the patients History and Physical, chart, labs and discussed the procedure including the risks, benefits and alternatives for the proposed anesthesia with the patient or authorized representative who has indicated his/her understanding and acceptance.   Dental Advisory Given  Plan Discussed with: CRNA  Anesthesia Plan Comments:         Anesthesia Quick Evaluation

## 2018-01-26 NOTE — Anesthesia Procedure Notes (Signed)
Spinal  Patient location during procedure: OR Staffing Anesthesiologist: Molli Barrows, MD Resident/CRNA: Rolla Plate, CRNA Other anesthesia staff: Foxworth, Richard Carlean Jews, RN Performed: resident/CRNA and other anesthesia staff  Preanesthetic Checklist Completed: patient identified, site marked, surgical consent, pre-op evaluation, timeout performed, IV checked, risks and benefits discussed and monitors and equipment checked Spinal Block Patient position: sitting Prep: ChloraPrep and site prepped and draped Patient monitoring: heart rate, continuous pulse ox, blood pressure and cardiac monitor Approach: midline Location: L4-5 Injection technique: single-shot Needle Needle type: Whitacre and Introducer  Needle gauge: 24 G Needle length: 9 cm Additional Notes Negative paresthesia. Negative blood return. Positive free-flowing CSF. Expiration date of kit checked and confirmed. Patient tolerated procedure well, without complications.

## 2018-01-26 NOTE — Consult Note (Signed)
PHARMACIST - PHYSICIAN ORDER COMMUNICATION  CONCERNING: P&T Medication Policy on Herbal Medications  DESCRIPTION:  This patient's order for:  Calcium/Magnesium/Zinc and Zinc capsules  has been noted.  This product(s) is classified as an "herbal" or natural product. Due to a lack of definitive safety studies or FDA approval, nonstandard manufacturing practices, plus the potential risk of unknown drug-drug interactions while on inpatient medications, the Pharmacy and Therapeutics Committee does not permit the use of "herbal" or natural products of this type within [facility].   ACTION TAKEN: The pharmacy department is unable to verify this order at this time and your patient has been informed of this safety policy. Please reevaluate patient's clinical condition at discharge and address if the herbal or natural product(s) should be resumed at that time.    Paticia Stack, PharmD Pharmacy Resident  01/26/2018 3:56 PM

## 2018-01-26 NOTE — Op Note (Signed)
01/26/2018  1:57 PM  PATIENT:  Mario [name]  67 y.o. male  PRE-OPERATIVE DIAGNOSIS:  PRIMARY OSTEOARTHRITIS OF RIGHT HIP  POST-OPERATIVE DIAGNOSIS:  PRIMARY OSTEOARTHRITIS OF RIGHT HIP  PROCEDURE:  Procedure(s): TOTAL HIP ARTHROPLASTY ANTERIOR APPROACH (Right)  SURGEON: Laurene Footman, MD  ASSISTANTS: None  ANESTHESIA:   spinal  EBL:  Total I/O In: 700 [I.V.:700] Out: 500 [Urine:200; Blood:300]  BLOOD ADMINISTERED:none  DRAINS: none   LOCAL MEDICATIONS USED:  MARCAINE     SPECIMEN:  Source of Specimen:  Right femoral head  DISPOSITION OF SPECIMEN:  PATHOLOGY  COUNTS:  YES  TOURNIQUET:  * No tourniquets in log *  IMPLANTS: Medacta 52 mm Mpact DM cup and liner, 28 mm S ceramic head and 5 lateralized Masterloc femoral stem  DICTATION: .Dragon Dictation   The patient was brought to the operating room and after spinal anesthesia was obtained and was placed on the operative table with the ipsilateral foot into the Medacta attachment, contralateral leg on a well-padded table. C-arm was brought in and preop template x-ray taken. After prepping and draping in usual sterile fashion appropriate patient identification and timeout procedures were completed. Anterior approach to the hip was obtained and centered over the greater trochanter and TFL muscle. The subcutaneous tissue was incised hemostasis being achieved by electrocautery. TFL fascia was incised and the muscle retracted laterally deep retractor placed. The lateral femoral circumflex vessels were identified and ligated. The anterior capsule was exposed and a capsulotomy performed. The neck was identified and a femoral neck cut carried out with a saw. The head was removed without difficulty and showed sclerotic femoral head and acetabulum. Reaming was carried out to 52 mm and a 52 mm cup trial gave appropriate tightness to the acetabular component a 52 DM cup was impacted into position. The leg was then externally rotated and  ischiofemoral and pubofemoral releases carried out. The femur was sequentially broached to a size 5, size the lateralized with S head trials were placed and the final components chosen. The 5 Masterloc stem was inserted along with a S ceramic 28 mm head and 52 mm liner. The hip was reduced and was stable the wound was thoroughly irrigated with fibrillar placed along the posterior capsule and medial neck. The deep fascia ws closed using a heavy Quill after infiltration of 30 cc of quarter percent Sensorcaine with epinephrine.3-0 V-loc to close the skin with skin staples.  There is no wound VAC applied, patient was sent to recovery in stable condition.   PLAN OF CARE: Admit to inpatient

## 2018-01-26 NOTE — NC FL2 (Signed)
  [place] LEVEL OF CARE SCREENING TOOL     IDENTIFICATION  Patient Name: Mario [name] Birthdate: Feb 28, 1951 Sex: male Admission Date (Current Location): 01/26/2018  [place] and [place] Number:  Engineering geologist and Address:  [facility], [address], [place], [place] [address]      Provider Number: [id number]  Attending Physician Name and Address:  Hessie Knows, MD  Relative Name and Phone Number:       Current Level of Care: Hospital Recommended Level of Care: [place] Prior Approval Number:    Date Approved/Denied:   PASRR Number: [id number])  Discharge Plan: SNF    Current Diagnoses: Patient Active Problem List   Diagnosis Date Noted  . Status post total hip replacement, right 01/26/2018    Orientation RESPIRATION BLADDER Height & Weight     Self, Time, Situation, Place  Normal Continent Weight: 295 lb (133.8 kg) Height:  5\' 10"  (177.8 cm)  BEHAVIORAL SYMPTOMS/MOOD NEUROLOGICAL BOWEL NUTRITION STATUS      Continent Diet(Diet: Regular )  AMBULATORY STATUS COMMUNICATION OF NEEDS Skin   Extensive Assist Verbally Surgical wounds, Wound Vac(Incision: Right Hip, Provena Wound Vac. )                       Personal Care Assistance Level of Assistance  Bathing, Feeding, Dressing Bathing Assistance: Limited assistance Feeding assistance: Independent Dressing Assistance: Limited assistance     Functional Limitations Info  Sight, Hearing, Speech Sight Info: Adequate Hearing Info: Adequate Speech Info: Adequate    SPECIAL CARE FACTORS FREQUENCY  PT (By licensed PT), OT (By licensed OT)     PT Frequency: (5) OT Frequency: (5)            Contractures      Additional Factors Info  Code Status, Allergies Code Status Info: (not on file ) Allergies Info: (Levaquin Levofloxacin)           Current Medications (01/26/2018):  This is the current hospital active medication list Current  Facility-Administered Medications  Medication Dose Route Frequency Provider Last Rate Last Dose  . 0.9 %  sodium chloride infusion   Intravenous Continuous Durenda Hurt, MD      . famotidine (PEPCID) tablet 20 mg  20 mg Oral Once Durenda Hurt, MD      . fentaNYL (SUBLIMAZE) injection 25 mcg  25 mcg Intravenous Q5 min PRN Molli Barrows, MD      . ondansetron [facility]) injection 4 mg  4 mg Intravenous Once PRN Molli Barrows, MD         Discharge Medications: Please see discharge summary for a list of discharge medications.  Relevant Imaging Results:  Relevant Lab Results:   Additional Information (SSN: [id number])  Ammanda Dobbins, Veronia Beets, LCSW

## 2018-01-26 NOTE — H&P (Signed)
Reviewed paper H+P, will be scanned into chart. No changes noted.  

## 2018-01-26 NOTE — Progress Notes (Signed)
   01/26/18 1200  Clinical Encounter Type  Visited With Patient;Family (Wife)  Visit Type Initial;Spiritual support;Other (Comment) (Advance Directive education/completion.)  Recommendations Follow-up as needed.  Spiritual Encounters  Spiritual Needs Emotional;Prayer  Stress Factors  Patient Stress Factors Health changes;Other (Comment) (Fear of Dying.)  Advance Directives (For Healthcare)  Does Patient Have a Medical Advance Directive? Yes  Type of Advance Directive Living will;Healthcare Power of [place] in Chart? Yes - validated most recent copy scanned in chart (See row information) (Paper copy in his record.)  Copy of Living Will in Chart? Yes - validated most recent copy scanned in chart (See row information) (Paper copy in his record. )   Chaplain was requested to complete a [place] AD for the patient. Chaplain secured the notary, witnesses, and facilitated the completion of the AD. A paper copy was placed in the patient's file, electronic scanning is required. The original was given to the patient's wife. Chaplain remained to provide crisis care to the patient who experienced intense anxiety. Worries about surgery and fears of his death continued to drive periodic teary outbursts. Chaplain provided assurance, active listening, empathetic support, and prayer.

## 2018-01-26 NOTE — Progress Notes (Signed)
Pt called nurse to bedside, pt states he is having a panic/ claustrophobic attack and "needs to get up now". Pt getting up, stating "he needs to go outside". Charge nurse York Cerise assisted this nurse in putting pt in a wheelchair and taking him outside. Pt requesting something to help him calm down.  MD Rudene Christians notified and placing Ativan order.

## 2018-01-26 NOTE — Progress Notes (Signed)
Took patient outside for 30 minutes with some relief from claustrophobic attack. Notified MD post one hour from Lorazepam given PO., with some relief.  Patient states that he takes 8mg  of Ambien to get on a plane. New orders may give Lorazepam 2mg  x1 if no relief. New orders to give Ambien 10mg  at HS. May remove foley if needed.  Patient to be seen in am for possible discharge.

## 2018-01-27 ENCOUNTER — Encounter: Payer: Self-pay | Admitting: Orthopedic Surgery

## 2018-01-27 LAB — CBC
HCT: 38.7 % — ABNORMAL LOW (ref 39.0–52.0)
HEMOGLOBIN: 12.8 g/dL — AB (ref 13.0–17.0)
MCH: 30.1 pg (ref 26.0–34.0)
MCHC: 33.1 g/dL (ref 30.0–36.0)
MCV: 91.1 fL (ref 80.0–100.0)
Platelets: 192 10*3/uL (ref 150–400)
RBC: 4.25 MIL/uL (ref 4.22–5.81)
RDW: 12.8 % (ref 11.5–15.5)
WBC: 9.8 10*3/uL (ref 4.0–10.5)
nRBC: 0 % (ref 0.0–0.2)

## 2018-01-27 LAB — GLUCOSE, CAPILLARY
GLUCOSE-CAPILLARY: 120 mg/dL — AB (ref 70–99)
GLUCOSE-CAPILLARY: 137 mg/dL — AB (ref 70–99)
GLUCOSE-CAPILLARY: 113 mg/dL — AB (ref 70–99)
Glucose-Capillary: 160 mg/dL — ABNORMAL HIGH (ref 70–99)

## 2018-01-27 LAB — BASIC METABOLIC PANEL
Anion gap: 8 (ref 5–15)
BUN: 9 mg/dL (ref 8–23)
CHLORIDE: 102 mmol/L (ref 98–111)
CO2: 25 mmol/L (ref 22–32)
Calcium: 8 mg/dL — ABNORMAL LOW (ref 8.9–10.3)
Creatinine, Ser: 0.61 mg/dL (ref 0.61–1.24)
GFR calc non Af Amer: >60 mL/min (ref >60)
Glucose, Bld: 151 mg/dL — ABNORMAL HIGH (ref 70–99)
POTASSIUM: 3.8 mmol/L (ref 3.5–5.1)
SODIUM: 135 mmol/L (ref 135–145)

## 2018-01-27 MED ORDER — OXYCODONE HCL 5 MG PO TABS: 5.0000 mg | ORAL_TABLET | ORAL | 0 refills | Status: DC | PRN

## 2018-01-27 MED ORDER — ASPIRIN 81 MG PO CHEW: 81.0000 mg | CHEWABLE_TABLET | Freq: Two times a day (BID) | ORAL | 0 refills | Status: AC

## 2018-01-27 MED ORDER — METHOCARBAMOL 500 MG PO TABS: 500.0000 mg | ORAL_TABLET | Freq: Four times a day (QID) | ORAL | 0 refills | Status: DC | PRN

## 2018-01-27 NOTE — Discharge Summary (Signed)
Physician Discharge Summary  Patient ID: Mario [name] MRN: [id number] DOB/AGE: 15-Dec-1950 67 y.o.  Admit date: 01/26/2018 Discharge date: 01/29/2018 Admission Diagnoses:  PRIMARY OSTEOARTHRITIS OF RIGHT HIP   Discharge Diagnoses: Patient Active Problem List   Diagnosis Date Noted  . Status post total hip replacement, right 01/26/2018    Past Medical History:  Diagnosis Date  . Anginal pain ([place])   . Back pain    lower  . Claustrophobia   . Diabetes mellitus without complication ([place])   . Dyspnea   . Elevated lipids   . Environmental and seasonal allergies   . GERD (gastroesophageal reflux disease)   . Glaucoma   . HH (hiatus hernia)   . Hip pain Right  . Knee pain    right  . Sleep apnea      Transfusion: none   Consultants (if any):   Discharged Condition: Improved  Hospital Course: Glendon Dunwoody is an 67 y.o. male who was admitted 01/26/2018 with a diagnosis of right hip osteoarthritis and went to the operating room on 01/26/2018 and underwent the above named procedures.    Surgeries: Procedure(s): TOTAL HIP ARTHROPLASTY ANTERIOR APPROACH on 01/26/2018 Patient tolerated the surgery well. Taken to PACU where she was stabilized and then transferred to the orthopedic floor.  Started on aspirin, TEDs, SCDs. Heels elevated on bed with rolled towels. No evidence of DVT. Negative [place]. Physical therapy started on day #1 for gait training and transfer. OT started day #1 for ADL and assisted devices.  Patient's foley was d/c on day #1. Patient's IV was d/c on day #2.  On post op day #3 patient was stable and ready for discharge to home with HHPT.  Implants: Medacta 52 mm Mpact DM cup and liner, 28 mm S ceramic head and 5 lateralized [place] femoral stem  He was given perioperative antibiotics:  Anti-infectives (From admission, onward)   Start     Dose/Rate Route Frequency Ordered Stop   01/26/18 1630  ceFAZolin (ANCEF) 3 g in dextrose 5 % 50 mL IVPB     3 g 100  mL/hr over 30 Minutes Intravenous Every 6 hours 01/26/18 1537 01/27/18 0522   01/25/18 2230  ceFAZolin (ANCEF) 3 g in dextrose 5 % 50 mL IVPB     3 g 100 mL/hr over 30 Minutes Intravenous  Once 01/25/18 2222 01/26/18 1235    .  He was given sequential compression devices, early ambulation, and aspirin TEDs for DVT prophylaxis.  He benefited maximally from the hospital stay and there were no complications.    Recent vital signs:  Vitals:   01/27/18 0405 01/27/18 0822  BP: (!) 143/60 113/66  Pulse: 88 78  Resp: 19 18  Temp: 99 F (37.2 C) 98.5 F (36.9 C)  SpO2: 99% 96%    Recent laboratory studies:  Lab Results  Component Value Date   HGB 12.8 (L) 01/27/2018   HGB 15.0 01/15/2018   Lab Results  Component Value Date   WBC 9.8 01/27/2018   PLT 192 01/27/2018   Lab Results  Component Value Date   INR 0.98 01/15/2018   Lab Results  Component Value Date   NA 135 01/27/2018   K 3.8 01/27/2018   CL 102 01/27/2018   CO2 25 01/27/2018   BUN 9 01/27/2018   CREATININE 0.61 01/27/2018   GLUCOSE 151 (H) 01/27/2018    Discharge Medications:   Allergies as of 01/27/2018      Reactions   Levaquin [levofloxacin] Itching  Red lines      Medication List    STOP taking these medications   ibuprofen 200 MG tablet Commonly known as:  ADVIL,MOTRIN     TAKE these medications   aspirin 81 MG chewable tablet Chew 1 tablet (81 mg total) by mouth 2 (two) times daily.   CALCIUM-MAGNESIUM-ZINC PO Take 1 tablet by mouth daily.   fluticasone 50 MCG/ACT nasal spray Commonly known as:  FLONASE Place 2 sprays into both nostrils 3 (three) times a week.   glimepiride 4 MG tablet Commonly known as:  AMARYL Take 4 mg by mouth at bedtime.   HM SUPER VITAMIN B12 2500 MCG Chew Generic drug:  Cyanocobalamin Chew 2,500 mcg by mouth daily.   Magnesium 400 MG Caps Take 1 capsule by mouth daily.   metFORMIN 1000 MG tablet Commonly known as:  GLUCOPHAGE Take 1,000 mg by mouth 2  (two) times daily with a meal.   methocarbamol 500 MG tablet Commonly known as:  ROBAXIN Take 1 tablet (500 mg total) by mouth every 6 (six) hours as needed for muscle spasms.   oxyCODONE 5 MG immediate release tablet Commonly known as:  Oxy IR/ROXICODONE Take 1-2 tablets (5-10 mg total) by mouth every 4 (four) hours as needed for moderate pain (pain score 4-6).   pravastatin 40 MG tablet Commonly known as:  PRAVACHOL Take 40 mg by mouth daily.   SINUS RELIEF PO Take 1 tablet by mouth at bedtime.   traZODone 100 MG tablet Commonly known as:  DESYREL Take 100 mg by mouth at bedtime.   TYLENOL 500 MG tablet Generic drug:  acetaminophen Take 500 mg by mouth every 6 (six) hours as needed.   Vitamin D 10 MCG/ML Liqd Take by mouth.   VITAMIN D3 PO Take 3,000 mg by mouth 2 (two) times a week.   ZINC PO Take 1 capsule by mouth daily.            Durable Medical Equipment  (From admission, onward)         Start     Ordered   01/26/18 1538  DME 3 n 1  Once     01/26/18 1537   01/26/18 1538  DME Walker rolling  Once    Question:  Patient needs a walker to treat with the following condition  Answer:  Status post total hip replacement, right   01/26/18 1537   01/26/18 1538  DME Bedside commode  Once    Question:  Patient needs a bedside commode to treat with the following condition  Answer:  Status post total hip replacement, right   01/26/18 1537          Diagnostic Studies: Dg Hip Operative Unilat W Or W/o Pelvis Right  Result Date: 01/26/2018 CLINICAL DATA:  Status post right total hip joint prosthesis placement. EXAM: OPERATIVE right HIP (WITH PELVIS IF PERFORMED) 3 VIEWS TECHNIQUE: Fluoroscopic spot image(s) were submitted for interpretation post-operatively. COMPARISON:  None. FINDINGS: Reported fluoro time is 12 seconds. The patient has undergone right total knee joint prosthesis placement. Radiographic positioning of the visualized prosthetic components is good.  The interface with the native bone is normal where visualized. IMPRESSION: No immediate complication following right hip joint prosthesis placement. Electronically Signed   By: David  Martinique M.D.   On: 01/26/2018 13:54   Dg Hip Unilat W Or W/o Pelvis 2-3 Views Right  Result Date: 01/26/2018 CLINICAL DATA:  Status post right hip replacement. EXAM: DG HIP (WITH OR WITHOUT PELVIS) 2-3V  RIGHT COMPARISON:  None. FINDINGS: The femoral and acetabular components appear to be well situated. No fracture or dislocation is noted. Expected postoperative changes are noted in the surrounding soft tissues. IMPRESSION: Status post right total hip arthroplasty. Electronically Signed   By: Marijo Conception, M.D.   On: 01/26/2018 14:52    Disposition:     Follow-up Information    Duanne Guess, PA-C Follow up in 2 week(s).   Specialties:  Orthopedic Surgery, Emergency Medicine Contact information: [place] [address] [phone]            Signed: Feliberto Gottron 01/27/2018, 9:55 PM

## 2018-01-27 NOTE — Evaluation (Signed)
Physical Therapy Evaluation Patient Details Name: Mario [name] MRN: [id number] DOB: [birth year] Today's Date: 01/27/2018   History of Present Illness  admitted for acute hospitalization status post R THR, ant approach, PWB (01/26/18).  Clinical Impression  Upon evaluation, patient visibly lethargic, but arousable to voice/touch.  Follows commands, but requires constant cuing for alertness and attention to task.  Vitals stable and WFL throughout session; pain rated 5-6/10 at rest and with WBing.  Currently requiring min assist for bed mobility; min assist for sit/stand, basic transfers and gait (100') with RW.  Maintains broad BOS with short/choppy steps; mild give in knees, but no overt buckling or LOB.  Anticipate improvement in mobility as alertness and cognitive status improves. Would benefit from skilled PT to address above deficits and promote optimal return to PLOF; Recommend transition to [place] upon discharge from acute hospitalization.     Follow Up Recommendations Home health PT    Equipment Recommendations  Rolling walker with 5" wheels;3in1 (PT)(bariatric RW if qualifies?)    Recommendations for Other Services       Precautions / Restrictions Precautions Precautions: Fall;Anterior Hip Restrictions Weight Bearing Restrictions: Yes RLE Weight Bearing: Partial weight bearing RLE Partial Weight Bearing Percentage or Pounds: 50      Mobility  Bed Mobility Overal bed mobility: Needs Assistance Bed Mobility: Supine to Sit     Supine to sit: Min assist     General bed mobility comments: assist for R LE position/protection  Transfers Overall transfer level: Needs assistance Equipment used: Rolling walker (2 wheeled) Transfers: Sit to/from Stand Sit to Stand: Min assist         General transfer comment: very broad BOS, cuing for hand placement  Ambulation/Gait Ambulation/Gait assistance: Min assist;+2 safety/equipment Gait Distance (Feet): 100 Feet Assistive  device: Rolling walker (2 wheeled)       General Gait Details: broad BOS, forward trunk flexion with mod WBing bilat UEs.  Decreased cadence, decreased dynamic balance.  Mild giving in bilat knees, but no overt buckling.  Stairs            Wheelchair Mobility    Modified Rankin (Stroke Patients Only)       Balance Overall balance assessment: Needs assistance Sitting-balance support: No upper extremity supported;Feet supported Sitting balance-Leahy Scale: Good     Standing balance support: Bilateral upper extremity supported Standing balance-Leahy Scale: Fair                               Pertinent Vitals/Pain Pain Assessment: Faces Faces Pain Scale: Hurts little more Pain Location: R hip Pain Descriptors / Indicators: Aching;Grimacing;Guarding Pain Intervention(s): Limited activity within patient's tolerance;Monitored during session;Repositioned    Home Living Family/patient expects to be discharged to:: Private residence Living Arrangements: Spouse/significant other Available Help at Discharge: Family Type of Home: House Home Access: Level entry     Home Layout: Two level;Bed/bath upstairs Home Equipment: None      Prior Function Level of Independence: Independent         Comments: Indep with ADLs, household and mobilization without assist device; does endorse at least 3 falls within previous year     Hand Dominance   Dominant Hand: Right    Extremity/Trunk Assessment   Upper Extremity Assessment Upper Extremity Assessment: Overall WFL for tasks assessed(history of L UE orthopedic surgery (bone removal/grafting))    Lower Extremity Assessment Lower Extremity Assessment: Generalized weakness(R hip grossly 3-/5, limited by pain; otherwise, grossly  at least 4-/5 throughout)       Communication   Communication: No difficulties  Cognition Arousal/Alertness: Lethargic;Suspect due to medications Behavior During Therapy: Flat  affect Overall Cognitive Status: Difficult to assess                                 General Comments: cognitive testing somewhat limited by lethargy (intermittently falling asleep during interview)      General Comments      Exercises Other Exercises Other Exercises: Supine LE therex, 1x10, act assist ROM: ankle pumps, quad sets, hip abduct/adduct and heel slides.  Consistent cuing for alertness and active participation with session.   Assessment/Plan    PT Assessment Patient needs continued PT services  PT Problem List Decreased strength;Decreased range of motion;Decreased activity tolerance;Decreased balance;Decreased mobility;Decreased coordination;Decreased cognition;Decreased knowledge of use of DME;Decreased safety awareness;Decreased knowledge of precautions;Decreased skin integrity;Pain       PT Treatment Interventions DME instruction;Gait training;Stair training;Functional mobility training;Therapeutic activities;Therapeutic exercise;Balance training;Patient/family education    PT Goals (Current goals can be found in the Care Plan section)  Acute Rehab PT Goals Patient Stated Goal: to return home PT Goal Formulation: With patient Time For Goal Achievement: 02/10/18 Potential to Achieve Goals: Good    Frequency BID   Barriers to discharge        Co-evaluation               AM-PAC PT "6 Clicks" Daily Activity  Outcome Measure Difficulty turning over in bed (including adjusting bedclothes, sheets and blankets)?: A Little Difficulty moving from lying on back to sitting on the side of the bed? : A Little Difficulty sitting down on and standing up from a chair with arms (e.g., wheelchair, bedside commode, etc,.)?: Unable Help needed moving to and from a bed to chair (including a wheelchair)?: A Little Help needed walking in hospital room?: A Little Help needed climbing 3-5 steps with a railing? : A Lot 6 Click Score: 15    End of Session  Equipment Utilized During Treatment: Gait belt Activity Tolerance: Patient limited by lethargy;Patient limited by pain Patient left: in chair;with call bell/phone within reach;with chair alarm set Nurse Communication: Mobility status PT Visit Diagnosis: Unsteadiness on feet (R26.81);Muscle weakness (generalized) (M62.81);Difficulty in walking, not elsewhere classified (R26.2);Pain Pain - Right/Left: Right Pain - part of body: Hip    Time: 9476-5465 PT Time Calculation (min) (ACUTE ONLY): 21 min   Charges:   PT Evaluation $PT Eval Moderate Complexity: 1 Mod PT Treatments $Therapeutic Exercise: 8-22 mins        [place] Savini H. Owens Shark, PT, DPT, NCS 01/27/18, 1:42 PM [phone]

## 2018-01-27 NOTE — Progress Notes (Signed)
PT Cancellation Note  Patient Details Name: Mario [name] MRN: [id number] DOB: October 27, 1950   Cancelled Treatment:    Reason Eval/Treat Not Completed: (Patient sleeping soundly upon PM attempt.  Will re-attempt at later time this PM to promote optimal alertness and participation)  Twala Collings H. Owens Shark, PT, DPT, NCS 01/27/18, 2:04 PM [phone]

## 2018-01-27 NOTE — Care Management (Signed)
Bedside commode and bariatric rolling walker requested from [facility]. RNCM will follow up with patient regarding home health agency.

## 2018-01-27 NOTE — Progress Notes (Signed)
OT Cancellation Note  Patient Details Name: Mario [name] MRN: [id number] DOB: 1950-06-13   Cancelled Treatment:    Reason Eval/Treat Not Completed: Other (comment). Order received, chart reviewed. Pt recently done working with PT. Per PT, pt very lethargic and fatigued t/o session. No family present. Will hold OT evaluation this am and re-attempt at later date/time as pt is more alert and able to meaningfully participate, and ideally to have family present to maximize effectiveness and carryover of education/training provided.   Jeni Salles, MPH, MS, OTR/L ascom [phone] 01/27/18, 11:05 AM

## 2018-01-27 NOTE — Anesthesia Postprocedure Evaluation (Signed)
Anesthesia Post Note  Patient: Mario [name]  Procedure(s) Performed: TOTAL HIP ARTHROPLASTY ANTERIOR APPROACH (Right Hip)  Patient location during evaluation: Nursing Unit Anesthesia Type: Spinal Level of consciousness: awake, awake and alert and oriented Pain management: pain level controlled Vital Signs Assessment: post-procedure vital signs reviewed and stable Respiratory status: spontaneous breathing, nonlabored ventilation and respiratory function stable Cardiovascular status: blood pressure returned to baseline and stable Postop Assessment: no headache and no backache Anesthetic complications: no     Last Vitals:  Vitals:   01/26/18 2308 01/27/18 0405  BP: (!) 140/57 (!) 143/60  Pulse: 85 88  Resp: 19 19  Temp: 36.7 C 37.2 C  SpO2: 98% 99%    Last Pain:  Vitals:   01/27/18 0405  TempSrc: Oral  PainSc:                  Johnna Acosta

## 2018-01-27 NOTE — Progress Notes (Signed)
Clinical Social Worker (CSW) received SNF consult. PT is recommending home health. RN case manager is aware of above. Please reconsult if future social work needs arise. CSW signing off.   [name], LCSW [phone] 

## 2018-01-27 NOTE — Care Management Note (Signed)
Case Management Note  Patient Details  Name: Mario [name] MRN: [id number] Date of Birth: 10-06-1950  Subjective/Objective:                  RNCM attempted to meet with patient but he was complaining of pain and didn't want to bother staff.  During my conversation with him and call to RN patient fell asleep.  I did get out of him that he lives wit his wife.  These names were listed on the white board in his room: Cristie Hem son [phone] Juliann Pulse wife [phone]. Patient may be getting confused.   Action/Plan: Home health list left. RNCM will follow. RN aware.  Expected Discharge Date:                  Expected Discharge Plan:     In-House Referral:     Discharge planning Services  CM Consult  Post Acute Care Choice:  Home Health, Durable Medical Equipment Choice offered to:  Patient  DME Arranged:    DME Agency:     HH Arranged:    [place] Agency:     Status of Service:  In process, will continue to follow  If discussed at Long Length of Stay Meetings, dates discussed:    Additional Comments:  Marshell Garfinkel, RN 01/27/2018, 9:25 AM

## 2018-01-27 NOTE — Care Management (Signed)
RNCM met with patient to discuss home health agency for home PT.  He has selected [facility].  I have sent referral. DME has been delivered to this room.

## 2018-01-27 NOTE — Progress Notes (Signed)
Physical Therapy Treatment Patient Details Name: Mario [name] MRN: [id number] DOB: 11-16-1950 Today's Date: 01/27/2018    History of Present Illness admitted for acute hospitalization status post R THR, ant approach, PWB (01/26/18).    PT Comments    Sleeping with BiPAP donned beginning of session; awakens with mod cuing from therapist.  Reporting increased pain and soreness to R LE this date, requesting pain meds; RN informed/aware (not available for another hour).  Patient agreeable to participate with session despite pain. Remains generally lethargic and mildly confused at times; absent recall/awareness of PT session this AM.  Mobility generally unsteady (due to lethargy?), requiring chair follow for optimal safety.  Consistent cuing for PWB R LE and for postural extension, bilat TKE in loading phases of gait cycle.  Distance limited by pain. Will continue to monitor progress tomorrow AM; if not with significant improvement in pain control and overall functional performance, may consider transition to STR.   Follow Up Recommendations  Home health PT     Equipment Recommendations  Rolling walker with 5" wheels;3in1 (PT)    Recommendations for Other Services       Precautions / Restrictions Precautions Precautions: Fall;Anterior Hip Restrictions Weight Bearing Restrictions: Yes RLE Weight Bearing: Partial weight bearing RLE Partial Weight Bearing Percentage or Pounds: 50    Mobility  Bed Mobility Overal bed mobility: Needs Assistance Bed Mobility: Supine to Sit     Supine to sit: Min assist;Mod assist     General bed mobility comments: assist for R LE position/protection  Transfers Overall transfer level: Needs assistance Equipment used: Rolling walker (2 wheeled) Transfers: Sit to/from Stand Sit to Stand: Min assist         General transfer comment: very broad BOS, use of momentum to initiate and complete lift off  Ambulation/Gait Ambulation/Gait assistance:  Min assist;+2 safety/equipment Gait Distance (Feet): 90 Feet Assistive device: Rolling walker (2 wheeled)       General Gait Details: broad BOS, decreased stance time R LE; heavy WBing bilat UEs.  Slow and effortful cadence; verbal cuing for bilat TKE in loading for optimal control/stability.   Stairs             Wheelchair Mobility    Modified Rankin (Stroke Patients Only)       Balance Overall balance assessment: Needs assistance Sitting-balance support: No upper extremity supported;Feet supported Sitting balance-Leahy Scale: Fair     Standing balance support: Bilateral upper extremity supported Standing balance-Leahy Scale: Fair                              Cognition Arousal/Alertness: Lethargic Behavior During Therapy: Flat affect Overall Cognitive Status: No family/caregiver present to determine baseline cognitive functioning                                 General Comments: follows commands, increased time for processing and overall task comprehension; absent awareness/recall of PT session this AM      Exercises Other Exercises Other Exercises: Sit/stand x3 with RW, min assist-mantains broad BOS, use of momentum and UE support to initiate and complete lift off Other Exercises: Dep for hygiene, clothing management and lower body dressing after incontinent bladder episode (missed urinal).    General Comments        Pertinent Vitals/Pain Pain Assessment: Faces Faces Pain Scale: Hurts whole lot Pain Location: R hip Pain Descriptors /  Indicators: Aching;Grimacing;Guarding Pain Intervention(s): Limited activity within patient's tolerance;Monitored during session;Patient requesting pain meds-RN notified;Repositioned    Home Living Family/patient expects to be discharged to:: Private residence Living Arrangements: Spouse/significant other Available Help at Discharge: Family Type of Home: House Home Access: Level entry   Home  Layout: Two level;Bed/bath upstairs Home Equipment: None      Prior Function Level of Independence: Independent      Comments: Indep with ADLs, household and mobilization without assist device; does endorse at least 3 falls within previous year   PT Goals (current goals can now be found in the care plan section) Acute Rehab PT Goals Patient Stated Goal: to return home PT Goal Formulation: With patient Time For Goal Achievement: 02/10/18 Potential to Achieve Goals: Good Progress towards PT goals: Progressing toward goals    Frequency    BID      PT Plan Current plan remains appropriate    Co-evaluation              AM-PAC PT "6 Clicks" Daily Activity  Outcome Measure  Difficulty turning over in bed (including adjusting bedclothes, sheets and blankets)?: A Little Difficulty moving from lying on back to sitting on the side of the bed? : A Little Difficulty sitting down on and standing up from a chair with arms (e.g., wheelchair, bedside commode, etc,.)?: Unable Help needed moving to and from a bed to chair (including a wheelchair)?: A Little Help needed walking in hospital room?: A Little Help needed climbing 3-5 steps with a railing? : A Lot 6 Click Score: 15    End of Session Equipment Utilized During Treatment: Gait belt Activity Tolerance: Patient limited by pain Patient left: in chair;with call bell/phone within reach;with chair alarm set Nurse Communication: Mobility status PT Visit Diagnosis: Unsteadiness on feet (R26.81);Muscle weakness (generalized) (M62.81);Difficulty in walking, not elsewhere classified (R26.2);Pain Pain - Right/Left: Right Pain - part of body: Hip     Time: 6468-0321 PT Time Calculation (min) (ACUTE ONLY): 28 min  Charges:  $Gait Training: 8-22 mins $Therapeutic Exercise: 8-22 mins $Therapeutic Activity: 8-22 mins                    Katheen Aslin H. Owens Shark, PT, DPT, NCS 01/27/18, 3:43 PM [phone]

## 2018-01-27 NOTE — Progress Notes (Signed)
   Subjective: 1 Day Post-Op Procedure(s) (LRB): TOTAL HIP ARTHROPLASTY ANTERIOR APPROACH (Right) Patient reports pain as 10 on 0-10 scale.  No Oxycodone in 8 hrs. Patient is well, and has had no acute complaints or problems Denies any CP, SOB, ABD pain. We will continue therapy today.  Plan is to go Home after hospital stay.  Objective: Vital signs in last 24 hours: Temp:  [97.1 F (36.2 C)-99.7 F (37.6 C)] 99 F (37.2 C) (11/20 0405) Pulse Rate:  [65-90] 88 (11/20 0405) Resp:  [12-21] 19 (11/20 0405) BP: (95-148)/(50-81) 143/60 (11/20 0405) SpO2:  [95 %-100 %] 99 % (11/20 0405) Weight:  [133.8 kg] 133.8 kg (11/19 1116)  Intake/Output from previous day: 11/19 0701 - 11/20 0700 In: 750.8 [I.V.:700.8; IV Piggyback:50] Out: 1300 [Urine:950; Drains:50; Blood:300] Intake/Output this shift: No intake/output data recorded.  Recent Labs    01/27/18 0411  HGB 12.8*   Recent Labs    01/27/18 0411  WBC 9.8  RBC 4.25  HCT 38.7*  PLT 192   Recent Labs    01/27/18 0411  NA 135  K 3.8  CL 102  CO2 25  BUN 9  CREATININE 0.61  GLUCOSE 151*  CALCIUM 8.0*   No results for input(s): LABPT, INR in the last 72 hours.  EXAM General - Patient is Alert, Appropriate and Oriented Extremity - Neurovascular intact Sensation intact distally Intact pulses distally Dorsiflexion/Plantar flexion intact No cellulitis present Compartment soft Dressing - dressing C/D/I and no drainage Motor Function - intact, moving foot and toes well on exam.   Past Medical History:  Diagnosis Date  . Anginal pain ([place])   . Back pain    lower  . Claustrophobia   . Diabetes mellitus without complication ([place])   . Dyspnea   . Elevated lipids   . Environmental and seasonal allergies   . GERD (gastroesophageal reflux disease)   . Glaucoma   . HH (hiatus hernia)   . Hip pain Right  . Knee pain    right  . Sleep apnea     Assessment/Plan:   1 Day Post-Op Procedure(s) (LRB): TOTAL HIP  ARTHROPLASTY ANTERIOR APPROACH (Right) Active Problems:   Status post total hip replacement, right  Estimated body mass index is 42.33 kg/m as calculated from the following:   Height as of this encounter: 5\' 10"  (1.778 m).   Weight as of this encounter: 133.8 kg. Advance diet Up with therapy, 50% weightbearing right lower extremity x4 weeks Needs bowel movement Labs and vital signs are stable. Encourage incentive spirometer. Care management to assist with discharge to home with home health PT   DVT Prophylaxis - Aspirin, TED hose and SCDs    T. Rachelle Hora, PA-C [place] 01/27/2018, 7:55 AM

## 2018-01-27 NOTE — Progress Notes (Signed)
Pt alert and oriented resting in room. Transferred from chair back to bed with 1 assist. PRN pain medication given for pain. No other complaints from pt.

## 2018-01-28 LAB — GLUCOSE, CAPILLARY
GLUCOSE-CAPILLARY: 146 mg/dL — AB (ref 70–99)
GLUCOSE-CAPILLARY: 99 mg/dL (ref 70–99)
Glucose-Capillary: 113 mg/dL — ABNORMAL HIGH (ref 70–99)
Glucose-Capillary: 107 mg/dL — ABNORMAL HIGH (ref 70–99)

## 2018-01-28 LAB — SURGICAL PATHOLOGY

## 2018-01-28 MED ORDER — BISACODYL 10 MG RE SUPP
10.0000 mg | Freq: Every day | RECTAL | Status: DC | PRN
  Administered 2018-01-29: 10 mg via RECTAL
  Filled 2018-01-28: qty 1

## 2018-01-28 NOTE — Progress Notes (Signed)
Physical Therapy Treatment Patient Details Name: Mario [name] MRN: [id number] DOB: 1950-12-11 Today's Date: 01/28/2018    History of Present Illness admitted for acute hospitalization status post R THR, ant approach, PWB (01/26/18).    PT Comments    Pt generally did better with PT today and though he still had some impulsivity (especially with bed mobility and getting to standing) but overall showing improvement.  He was able to greatly increase ambulation distance despite considerable c/o pain t/o the effort and was also able to negotiate up/down 4 steps w/o direct physical assist.  Pt very motivated with exercises and was eager to push himself and liked the increasing resistance that PT provided t/o each set of exercises.   Follow Up Recommendations  Home health PT     Equipment Recommendations  Rolling walker with 5" wheels;3in1 (PT)    Recommendations for Other Services       Precautions / Restrictions Precautions Precautions: Fall;Anterior Hip Restrictions Weight Bearing Restrictions: Yes RLE Weight Bearing: Partial weight bearing RLE Partial Weight Bearing Percentage or Pounds: 50    Mobility  Bed Mobility Overal bed mobility: Needs Assistance Bed Mobility: Supine to Sit     Supine to sit: Min assist     General bed mobility comments: Pt somewhat impulsive about getting up as he needed to use the urinal, PT assisted with raising torso and maintaining appropriate positioning  Transfers Overall transfer level: Needs assistance Equipment used: Rolling walker (2 wheeled) Transfers: Sit to/from Stand Sit to Stand: Min assist         General transfer comment: again with broad BOS and UE/momentum reliant transition  Ambulation/Gait Ambulation/Gait assistance: Supervision Gait Distance (Feet): 175 Feet Assistive device: Rolling walker (2 wheeled)       General Gait Details: Pt initially with hesitant and slow ambulation, but with increased time/distance  showed increased confidence and consistency of cadence.  Pt using UEs appropriately (adjusted height of home walker) to maintain PWBing while still working on appropriate gait mechanics   Stairs Stairs: Yes Stairs assistance: Min guard Stair Management: One rail Right;Sideways Number of Stairs: 4 General stair comments: Pt continues with some impulsivity, but ultimatley was able to negotiate up/down steps w/o phyiscal assist.  Cuing to insure appropriate strategy and hand/foot placement.    Wheelchair Mobility    Modified Rankin (Stroke Patients Only)       Balance Overall balance assessment: Needs assistance Sitting-balance support: No upper extremity supported;Feet supported Sitting balance-Leahy Scale: Fair Sitting balance - Comments: Pt leaning away from R hip 2/2 pain, at time unsteady in sitting   Standing balance support: Bilateral upper extremity supported Standing balance-Leahy Scale: Fair Standing balance comment: Pt with no LOBs or overt unsteadiness but did need consistent cuing for safety                             Cognition Arousal/Alertness: Awake/alert Behavior During Therapy: WFL for tasks assessed/performed Overall Cognitive Status: Within Functional Limits for tasks assessed                                 General Comments: alert and oriented x4, follows all commands      Exercises Total Joint Exercises Ankle Circles/Pumps: Strengthening;10 reps Quad Sets: Strengthening;10 reps Gluteal Sets: Strengthening;10 reps Heel Slides: Strengthening;10 reps Hip ABduction/ADduction: Strengthening;10 reps Long Arc Quad: Strengthening;10 reps Marching in Standing: Seated;AAROM;AROM;10 reps  General Comments        Pertinent Vitals/Pain Pain Assessment: 0-10 Pain Score: 9  Pain Location: R hip Pain Descriptors / Indicators: Aching;Grimacing;Guarding Pain Intervention(s): Limited activity within patient's tolerance;Monitored  during session;Premedicated before session;Repositioned;Utilized relaxation techniques    Home Living Family/patient expects to be discharged to:: Private residence Living Arrangements: Spouse/significant other Available Help at Discharge: Family Type of Home: House Home Access: Level entry   Home Layout: Two level;Full bath on main level;Able to live on main level with bedroom/bathroom Home Equipment: None      Prior Function Level of Independence: Independent      Comments: Indep with ADLs, household and mobilization without assist device; does endorse at least 3 falls within previous year (2/2 R knee "giving out"); wife occasionally assists with socks/shoes   PT Goals (current goals can now be found in the care plan section) Acute Rehab PT Goals Patient Stated Goal: to have less pain and return home Progress towards PT goals: Progressing toward goals    Frequency    BID      PT Plan Current plan remains appropriate    Co-evaluation              AM-PAC PT "6 Clicks" Daily Activity  Outcome Measure  Difficulty turning over in bed (including adjusting bedclothes, sheets and blankets)?: A Little Difficulty moving from lying on back to sitting on the side of the bed? : Unable Difficulty sitting down on and standing up from a chair with arms (e.g., wheelchair, bedside commode, etc,.)?: A Little Help needed moving to and from a bed to chair (including a wheelchair)?: None Help needed walking in hospital room?: A Little Help needed climbing 3-5 steps with a railing? : A Little 6 Click Score: 17    End of Session Equipment Utilized During Treatment: Gait belt Activity Tolerance: Patient limited by pain Patient left: in chair;with call bell/phone within reach;with chair alarm set Nurse Communication: Mobility status;Patient requests pain meds PT Visit Diagnosis: Unsteadiness on feet (R26.81);Muscle weakness (generalized) (M62.81);Difficulty in walking, not elsewhere  classified (R26.2);Pain Pain - Right/Left: Right Pain - part of body: Hip     Time: 1005-1051 PT Time Calculation (min) (ACUTE ONLY): 46 min  Charges:  $Gait Training: 8-22 mins $Therapeutic Exercise: 8-22 mins $Therapeutic Activity: 8-22 mins                     Kreg Shropshire, DPT 01/28/2018, 1:19 PM

## 2018-01-28 NOTE — Discharge Instructions (Signed)

## 2018-01-28 NOTE — Progress Notes (Signed)
   01/28/18 0700  Clinical Encounter Type  Visited With Patient;Family (Son, Randall Hiss )  Visit Type Follow-up;Spiritual support  Recommendations Follow-up as requested.  Spiritual Encounters  Spiritual Needs Emotional;Prayer  Stress Factors  Patient Stress Factors Health changes (Anxiety)   Chaplain followed up with patient and provided active listening, emotional support, and prayer. Patient is less teary and seems in better spirits. A return visit may be requested.

## 2018-01-28 NOTE — Progress Notes (Signed)
   Subjective: 2 Days Post-Op Procedure(s) (LRB): TOTAL HIP ARTHROPLASTY ANTERIOR APPROACH (Right) Patient reports pain as moderate.   Patient is well, and has had no acute complaints or problems Denies any CP, SOB, ABD pain. We will continue therapy today.  Plan is to go Home after hospital stay.  Objective: Vital signs in last 24 hours: Temp:  [97.8 F (36.6 C)-98.6 F (37 C)] 98.6 F (37 C) (11/21 0731) Pulse Rate:  [84-87] 84 (11/21 0731) Resp:  [20] 20 (11/21 0731) BP: (116-121)/(58-64) 121/64 (11/21 0731) SpO2:  [96 %-97 %] 97 % (11/21 0731)  Intake/Output from previous day: 11/20 0701 - 11/21 0700 In: 120 [P.O.:120] Out: 1505 [Urine:1505] Intake/Output this shift: No intake/output data recorded.  Recent Labs    01/27/18 0411  HGB 12.8*   Recent Labs    01/27/18 0411  WBC 9.8  RBC 4.25  HCT 38.7*  PLT 192   Recent Labs    01/27/18 0411  NA 135  K 3.8  CL 102  CO2 25  BUN 9  CREATININE 0.61  GLUCOSE 151*  CALCIUM 8.0*   No results for input(s): LABPT, INR in the last 72 hours.  EXAM General - Patient is Alert, Appropriate and Oriented Extremity - Neurovascular intact Sensation intact distally Intact pulses distally Dorsiflexion/Plantar flexion intact No cellulitis present Compartment soft Dressing - dressing C/D/I and no drainage. prevena intact Motor Function - intact, moving foot and toes well on exam.   Past Medical History:  Diagnosis Date  . Anginal pain ([place])   . Back pain    lower  . Claustrophobia   . Diabetes mellitus without complication ([place])   . Dyspnea   . Elevated lipids   . Environmental and seasonal allergies   . GERD (gastroesophageal reflux disease)   . Glaucoma   . HH (hiatus hernia)   . Hip pain Right  . Knee pain    right  . Sleep apnea     Assessment/Plan:   2 Days Post-Op Procedure(s) (LRB): TOTAL HIP ARTHROPLASTY ANTERIOR APPROACH (Right) Active Problems:   Status post total hip replacement,  right  Estimated body mass index is 42.33 kg/m as calculated from the following:   Height as of this encounter: 5\' 10"  (1.778 m).   Weight as of this encounter: 133.8 kg. Advance diet Up with therapy, 50% weightbearing right lower extremity x4 weeks Needs bowel movement vital signs are stable. Encourage incentive spirometer. Care management to assist with discharge to home with home health PT, most llikely discharge home tomororw   DVT Prophylaxis - Aspirin, TED hose and SCDs    T. Rachelle Hora, PA-C [place] 01/28/2018, 8:50 AM

## 2018-01-28 NOTE — Care Management (Signed)
RNCM spoke with patient regarding anticipated discharge. He denies any other RNCM needs.

## 2018-01-28 NOTE — Evaluation (Signed)
Physical Therapy Evaluation Patient Details Name: Mario [name] MRN: [id number] DOB: [birth year] Today's Date: 01/28/2018   History of Present Illness  67 y/o male admitted for acute hospitalization status post R THR, ant approach, PWB (01/26/18).  Clinical Impression  Pt continues to be very motivated to do as much as he can and pushes himself during exercises, ambulation, mobility despite clearly being in pain and at times struggling significantly.  He is able to circumambulate the nurses' station with slow but consistent cadence (a few very brief standing rest breaks).  He is still struggling to reach independence with bed mobility, educated and discussed strategies to better deal with this in the home.       Follow Up Recommendations Home health PT    Equipment Recommendations  Rolling walker with 5" wheels;3in1 (PT)    Recommendations for Other Services       Precautions / Restrictions Precautions Precautions: Fall;Anterior Hip Restrictions RLE Weight Bearing: Partial weight bearing RLE Partial Weight Bearing Percentage or Pounds: 50      Mobility  Bed Mobility Overal bed mobility: Needs Assistance Bed Mobility: Supine to Sit;Sit to Supine     Supine to sit: Min guard Sit to supine: Min assist   General bed mobility comments: Pt very motivated to get himself to sitting EOB w/o assist.  He needed multiple attempts at it and heavy use of UEs on rail.  Did need assist to get R LE back fully into bed but otherwise transitioned back to supine on his own (again with great struggled)  Transfers Overall transfer level: Needs assistance Equipment used: Rolling walker (2 wheeled) Transfers: Sit to/from Stand Sit to Stand: Min assist            Ambulation/Gait Ambulation/Gait assistance: Supervision Gait Distance (Feet): 200 Feet Assistive device: Rolling walker (2 wheeled)       General Gait Details: Pt again with heavy reliance on walker (did maintain PWBing)  secondary to fatigue and feeling of R knee wanting to buckle at times (reports this happens regularly and that he is planning on a  R TKA relatively soon).  Pt with some fatigue during the effort (O2 mid 90s, but HR increased to nearly 120)  Stairs            Wheelchair Mobility    Modified Rankin (Stroke Patients Only)       Balance Overall balance assessment: Needs assistance   Sitting balance-Leahy Scale: Fair       Standing balance-Leahy Scale: Fair                               Pertinent Vitals/Pain Pain Score: 9  Pain Location: R hip    Home Living                        Prior Function                 Hand Dominance        Extremity/Trunk Assessment                Communication      Cognition Arousal/Alertness: Awake/alert Behavior During Therapy: WFL for tasks assessed/performed Overall Cognitive Status: Within Functional Limits for tasks assessed                                 General  Comments: alert and oriented x4, follows all commands      General Comments      Exercises Total Joint Exercises Ankle Circles/Pumps: Strengthening;10 reps Quad Sets: Strengthening;15 reps Gluteal Sets: Strengthening;15 reps Short Arc Quad: Strengthening;15 reps Heel Slides: Strengthening;10 reps Hip ABduction/ADduction: Strengthening;10 reps Straight Leg Raises: AAROM;5 reps Marching in Standing: Supine;AAROM;10 reps   Assessment/Plan    PT Assessment    PT Problem List         PT Treatment Interventions      PT Goals (Current goals can be found in the Care Plan section)       Frequency BID   Barriers to discharge        Co-evaluation               AM-PAC PT "6 Clicks" Daily Activity  Outcome Measure Difficulty turning over in bed (including adjusting bedclothes, sheets and blankets)?: A Little Difficulty moving from lying on back to sitting on the side of the bed? : A Lot Difficulty  sitting down on and standing up from a chair with arms (e.g., wheelchair, bedside commode, etc,.)?: A Little Help needed moving to and from a bed to chair (including a wheelchair)?: None Help needed walking in hospital room?: None Help needed climbing 3-5 steps with a railing? : A Little 6 Click Score: 19    End of Session Equipment Utilized During Treatment: Gait belt Activity Tolerance: Patient limited by pain Patient left: with bed alarm set;with nursing/sitter in room;with call bell/phone within reach Nurse Communication: Mobility status;Patient requests pain meds PT Visit Diagnosis: Unsteadiness on feet (R26.81);Muscle weakness (generalized) (M62.81);Difficulty in walking, not elsewhere classified (R26.2);Pain Pain - Right/Left: Right Pain - part of body: Hip    Time: 0964-3838 PT Time Calculation (min) (ACUTE ONLY): 44 min   Charges:     PT Treatments $Gait Training: 8-22 mins $Therapeutic Exercise: 8-22 mins $Therapeutic Activity: 8-22 mins        Kreg Shropshire, DPT 01/28/2018, 4:13 PM

## 2018-01-28 NOTE — Evaluation (Signed)
Occupational Therapy Evaluation Patient Details Name: Mario [name] MRN: [id number] DOB: 10/09/1950 Today's Date: 01/28/2018    History of Present Illness admitted for acute hospitalization status post R THR, ant approach, PWB (01/26/18).   Clinical Impression   Pt seen for OT evaluation this date, POD#1 from above surgery. Pt was independent in all ADLs prior to surgery, however occasionally having spouse assist him with socks/shoes. Pt is eager to return to PLOF with less pain and improved safety and independence. Pt currently requires MIN-MOD assist for LB dressing and bathing while in seated position due to pain and limited AROM of R hip. Pt instructed in self care skills, falls prevention strategies, home/routines modifications, DME/AE for LB bathing/dressing tasks and for toileting hygiene, and educated in cognitive behavioral pain coping strategies. Pt verbalized understanding to all education provided. Pt would benefit from additional instruction in self care skills and techniques to help maintain precautions with or without assistive devices to support recall and carryover prior to discharge. Recommend HHOT upon discharge.     Follow Up Recommendations  Home health OT    Equipment Recommendations  3 in 1 bedside commode(bariatric 3:1, toileting aide, reacher)    Recommendations for Other Services       Precautions / Restrictions Precautions Precautions: Fall;Anterior Hip Restrictions Weight Bearing Restrictions: Yes RLE Weight Bearing: Partial weight bearing RLE Partial Weight Bearing Percentage or Pounds: 50      Mobility Bed Mobility               General bed mobility comments: deferred, up in recliner  Transfers Overall transfer level: Needs assistance Equipment used: Rolling walker (2 wheeled) Transfers: Sit to/from Stand Sit to Stand: Min assist         General transfer comment: VC for hand/foot placement to maintain precautions    Balance Overall  balance assessment: Needs assistance Sitting-balance support: No upper extremity supported;Feet supported Sitting balance-Leahy Scale: Fair     Standing balance support: Bilateral upper extremity supported Standing balance-Leahy Scale: Fair                             ADL either performed or assessed with clinical judgement   ADL Overall ADL's : Needs assistance/impaired                                       General ADL Comments: Pt requires Min-Mod A for LB ADL tasks; spouse able to provide     Vision Baseline Vision/History: Wears glasses Wears Glasses: Reading only Patient Visual Report: No change from baseline       Perception     Praxis      Pertinent Vitals/Pain Pain Assessment: 0-10 Pain Score: 9  Pain Location: R hip Pain Descriptors / Indicators: Aching;Grimacing;Guarding Pain Intervention(s): Limited activity within patient's tolerance;Monitored during session;Premedicated before session;Repositioned;Utilized relaxation techniques     Hand Dominance Right   Extremity/Trunk Assessment Upper Extremity Assessment Upper Extremity Assessment: Overall WFL for tasks assessed(history of L UE orthopedic surgery (bone removal/grafting))   Lower Extremity Assessment Lower Extremity Assessment: Defer to PT evaluation(R hip grossly 3-/5, limited by pain; otherwise, grossly at least 4-/5 throughout)   Cervical / Trunk Assessment Cervical / Trunk Assessment: Normal   Communication Communication Communication: No difficulties   Cognition Arousal/Alertness: Awake/alert Behavior During Therapy: WFL for tasks assessed/performed Overall Cognitive Status: Within Functional Limits for  tasks assessed                                 General Comments: alert and oriented x4, follows all commands   General Comments       Exercises Other Exercises Other Exercises: pt educated in toileting aide to improve pt's thoroughness,  independence, and satisfaction with toileting hygiene Other Exercises: pt educated in AE for LB ADL tasks and 3:1 for seated shower and over toilet to improve toilet transfers Other Exercises: pt educated in cognitive behavioral pain coping strategies to maximize pt's self mgt of pain   Shoulder Instructions      Home Living Family/patient expects to be discharged to:: Private residence Living Arrangements: Spouse/significant other Available Help at Discharge: Family Type of Home: House Home Access: Level entry     Home Layout: Two level;Full bath on main level;Able to live on main level with bedroom/bathroom Alternate Level Stairs-Number of Steps: has bed/bath on lower level of home, but prefers to utilize Restaurant manager, fast food if able   [employer] Shower/Tub: Teacher, early years/pre: Standard     Home Equipment: None          Prior Functioning/Environment Level of Independence: Independent        Comments: Indep with ADLs, household and mobilization without assist device; does endorse at least 3 falls within previous year (2/2 R knee "giving out"); wife occasionally assists with socks/shoes        OT Problem List: Decreased strength;Decreased knowledge of use of DME or AE;Obesity;Decreased range of motion;Decreased knowledge of precautions;Pain;Impaired balance (sitting and/or standing)      OT Treatment/Interventions: Self-care/ADL training;Balance training;Therapeutic exercise;Therapeutic activities;DME and/or AE instruction;Patient/family education    OT Goals(Current goals can be found in the care plan section) Acute Rehab OT Goals Patient Stated Goal: to have less pain and return home OT Goal Formulation: With patient Time For Goal Achievement: 02/11/18 Potential to Achieve Goals: Good ADL Goals Pt Will Perform Lower Body Dressing: sit to/from stand;with modified independence;with adaptive equipment Pt Will Transfer to Toilet: with supervision;ambulating(BSC over  toilet, LRAD for amb) Additional ADL Goal #1: Pt will verbalize plan to utilize at least 2 learned cognitive behavioral pain coping strategies with no verbal cues for technique. Additional ADL Goal #2: Pt will independently instruct family/caregivers in compression stocking mgt including donning/doffing, wear schedule, and positioning.  OT Frequency: Min 2X/week   Barriers to D/C:            Co-evaluation              AM-PAC PT "6 Clicks" Daily Activity     Outcome Measure Help from another person eating meals?: None Help from another person taking care of personal grooming?: None Help from another person toileting, which includes using toliet, bedpan, or urinal?: A Little Help from another person bathing (including washing, rinsing, drying)?: A Little Help from another person to put on and taking off regular upper body clothing?: None Help from another person to put on and taking off regular lower body clothing?: A Little 6 Click Score: 21   End of Session    Activity Tolerance: Patient tolerated treatment well Patient left: in chair;with call bell/phone within reach;with chair alarm set  OT Visit Diagnosis: Other abnormalities of gait and mobility (R26.89);Repeated falls (R29.6);Muscle weakness (generalized) (M62.81);Pain Pain - Right/Left: Right Pain - part of body: Hip;Knee  Time: 3491-7915 OT Time Calculation (min): 36 min Charges:  OT General Charges $OT Visit: 1 Visit OT Evaluation $OT Eval Low Complexity: 1 Low OT Treatments $Self Care/Home Management : 23-37 mins  Jeni Salles, MPH, MS, OTR/L ascom [phone] 01/28/18, 12:12 PM

## 2018-01-28 NOTE — Progress Notes (Signed)
OT Cancellation Note  Patient Details Name: Mario [name] MRN: [id number] DOB: 1950-07-19   Cancelled Treatment:    Reason Eval/Treat Not Completed: Other (comment). Pt working with PT upon initial attempt today for OT evaluation. Will re-attempt.   Jeni Salles, MPH, MS, OTR/L ascom [phone] 01/28/18, 10:38 AM

## 2018-01-29 ENCOUNTER — Encounter: Payer: Self-pay | Admitting: Orthopedic Surgery

## 2018-01-29 LAB — BASIC METABOLIC PANEL
ANION GAP: 9 (ref 5–15)
BUN: 14 mg/dL (ref 8–23)
CALCIUM: 8.2 mg/dL — AB (ref 8.9–10.3)
CO2: 25 mmol/L (ref 22–32)
Chloride: 100 mmol/L (ref 98–111)
Creatinine, Ser: 0.64 mg/dL (ref 0.61–1.24)
GLUCOSE: 93 mg/dL (ref 70–99)
Potassium: 4.1 mmol/L (ref 3.5–5.1)
Sodium: 134 mmol/L — ABNORMAL LOW (ref 135–145)

## 2018-01-29 LAB — CBC
HEMATOCRIT: 36.2 % — AB (ref 39.0–52.0)
Hemoglobin: 12.3 g/dL — ABNORMAL LOW (ref 13.0–17.0)
MCH: 30.8 pg (ref 26.0–34.0)
MCHC: 34 g/dL (ref 30.0–36.0)
MCV: 90.5 fL (ref 80.0–100.0)
NRBC: 0 % (ref 0.0–0.2)
PLATELETS: 180 10*3/uL (ref 150–400)
RBC: 4 MIL/uL — ABNORMAL LOW (ref 4.22–5.81)
RDW: 12.6 % (ref 11.5–15.5)
WBC: 8.9 10*3/uL (ref 4.0–10.5)

## 2018-01-29 LAB — GLUCOSE, CAPILLARY
Glucose-Capillary: 79 mg/dL (ref 70–99)
Glucose-Capillary: 114 mg/dL — ABNORMAL HIGH (ref 70–99)

## 2018-01-29 NOTE — Progress Notes (Signed)
Physical Therapy Treatment Patient Details Name: Mario [name] MRN: [id number] DOB: 1951/02/03 Today's Date: 01/29/2018    History of Present Illness 67 y/o male status post R THR, ant approach, PWB (01/26/18).    PT Comments    Pt eager and willing to work with PT.  Showed improved transition to standing with less cuing and supervision needed. He had bout of lightheadedness after ~50 ft of ambulation (BP stable, likely related to need to have BM?)  He was able to rise w/o assist (3x this session) and continue walking after brief seated rest/recovery and then did a better job with stair negotiation with less impulsivity and less VCs required.  Pt then needed to go to the bathroom and wheeled back to room.  Follow Up Recommendations  Home health PT     Equipment Recommendations  Rolling walker with 5" wheels;3in1 (PT)    Recommendations for Other Services       Precautions / Restrictions Precautions Precautions: Fall;Anterior Hip Restrictions Weight Bearing Restrictions: Yes RLE Weight Bearing: Partial weight bearing RLE Partial Weight Bearing Percentage or Pounds: 50    Mobility  Bed Mobility               General bed mobility comments: not tested, in sitting on arrival, on commode post session  Transfers Overall transfer level: Modified independent Equipment used: Rolling walker (2 wheeled) Transfers: Sit to/from Stand Sit to Stand: Supervision         General transfer comment: 3 x sit to stand t/o session, cuing for hand placement and set up, but pt able to get to standing w/o assist each time with great effort  Ambulation/Gait Ambulation/Gait assistance: Min guard Gait Distance (Feet): 75 Feet Assistive device: Rolling walker (2 wheeled)       General Gait Details: Pt again with heavy reliance on walker (did maintain PWBing) secondary to fatigue and feeling of R knee wanting to buckle at times (reports this happens regularly and that he is planning on a  R  TKA relatively soon).  Pt with some fatigue during the effort and actually had dizzy spell where he needed to sit.  Felt better and continued some ambulation before feeling the urge to have BM and needing to forgo further ambulation.    Stairs Stairs: Yes Stairs assistance: Min guard Stair Management: One rail Right;Sideways Number of Stairs: 4 General stair comments: Pt did better with stair negotiation today, showed improved UE use and foot placement control.    Wheelchair Mobility    Modified Rankin (Stroke Patients Only)       Balance Overall balance assessment: Needs assistance Sitting-balance support: No upper extremity supported;Feet supported Sitting balance-Leahy Scale: Good Sitting balance - Comments: Pt more comfortable in sitting this session, not seeming to have to lean away from R side     Standing balance-Leahy Scale: Fair Standing balance comment: Pt with some issues involving dizziness (BM related) but no excessive unsteadiness/balance issues                            Cognition Arousal/Alertness: Awake/alert Behavior During Therapy: WFL for tasks assessed/performed Overall Cognitive Status: Within Functional Limits for tasks assessed                                 General Comments: alert and oriented x4, follows all commands      Exercises  General Comments General comments (skin integrity, edema, etc.): after lighthead episode during ambulation BP was 111/66 in sitting, and then on standing up 122/56 with no further symptoms      Pertinent Vitals/Pain Pain Assessment: 0-10 Pain Score: 9  Pain Location: R hip Pain Descriptors / Indicators: Aching;Grimacing;Guarding Pain Intervention(s): Limited activity within patient's tolerance;Monitored during session;Repositioned;Premedicated before session    Home Living Family/patient expects to be discharged to:: Private residence Living Arrangements: Spouse/significant  other Available Help at Discharge: Family Type of Home: House Home Access: Level entry            Prior Function            PT Goals (current goals can now be found in the care plan section) Acute Rehab PT Goals Patient Stated Goal: to have less pain and return home Progress towards PT goals: Progressing toward goals    Frequency    BID      PT Plan Current plan remains appropriate    Co-evaluation              AM-PAC PT "6 Clicks" Daily Activity  Outcome Measure  Difficulty turning over in bed (including adjusting bedclothes, sheets and blankets)?: A Little Difficulty moving from lying on back to sitting on the side of the bed? : A Lot Difficulty sitting down on and standing up from a chair with arms (e.g., wheelchair, bedside commode, etc,.)?: A Little Help needed moving to and from a bed to chair (including a wheelchair)?: None Help needed walking in hospital room?: None Help needed climbing 3-5 steps with a railing? : A Little 6 Click Score: 19    End of Session Equipment Utilized During Treatment: Gait belt Activity Tolerance: Patient limited by pain Patient left: in chair;with call bell/phone within reach(RN and CNA aware that he was using bathroom) Nurse Communication: Mobility status PT Visit Diagnosis: Unsteadiness on feet (R26.81);Muscle weakness (generalized) (M62.81);Difficulty in walking, not elsewhere classified (R26.2);Pain Pain - Right/Left: Right Pain - part of body: Hip     Time: 1132-1201 PT Time Calculation (min) (ACUTE ONLY): 29 min  Charges:  $Gait Training: 23-37 mins                     Kreg Shropshire, DPT 01/29/2018, 1:30 PM

## 2018-01-29 NOTE — Progress Notes (Signed)
Occupational Therapy Treatment Patient Details Name: Jobany Montellano MRN: [id number] DOB: Jun 25, 1950 Today's Date: 01/29/2018    History of present illness admitted for acute hospitalization status post R THR, ant approach, PWB (01/26/18).   OT comments  Pt educated and demonstrated teach back with reacher and sock aid sitting on EOB with heavy use with rails on bed to transition from supine to sitting EOB and rec he start to not use the railings since he won't have these at home and consider sleeping in recliner at home vs in bed since his wife is only 110 lbs and will not be able to help move or lift him.  His pain level was 7-8/10 at beginning of session and 10/10 at end but did not want any more pain meds to help with pain level.  Called PT to work with pt since he was sitting at EOB and did not want to sit in chair or lay back down since it was too painful to get out of bed.  Galen from PT with patient at end of session.  NSG updated that pt used urinal and had 380 cc output.  Follow Up Recommendations  Home health OT    Equipment Recommendations  3 in 1 bedside commode;Other (comment)(reacher and sock aid, elastic shoe laces and LH shoe horn)    Recommendations for Other Services      Precautions / Restrictions Precautions Precautions: Fall;Anterior Hip Restrictions Weight Bearing Restrictions: Yes RLE Weight Bearing: Partial weight bearing RLE Partial Weight Bearing Percentage or Pounds: 50       Mobility Bed Mobility                  Transfers                      Balance                                           ADL either performed or assessed with clinical judgement   ADL Overall ADL's : Needs assistance/impaired                                       General ADL Comments: Pt educated and demonstrated teach back with reacher and sock aid sitting on EOB with heavy use with rails on bed to transition from supine to  sitting EOB and rec he start to not use the railings since he won't have these at home and consider sleeping in recliner at home vs in bed since his wife is only 110 lbs and will not be able to help move or lift him.  His pain level was 7-8/10 at beginning of session and 10/10 at end but did not want any more pain meds to help with pain level.  Called PT to work with pt since he was sitting at EOB and did not want to sit in chair or lay back down since it was too painful to get out of bed.  Galen from PT with patient at end of session.  NSG updated that pt used urinal and had 380 cc output.       Vision Baseline Vision/History: Wears glasses Wears Glasses: Reading only Patient Visual Report: No change from baseline     Perception     Praxis  Cognition Arousal/Alertness: Awake/alert Behavior During Therapy: WFL for tasks assessed/performed Overall Cognitive Status: Within Functional Limits for tasks assessed                                 General Comments: alert and oriented x4, follows all commands        Exercises     Shoulder Instructions       General Comments      Pertinent Vitals/ Pain       Pain Assessment: 0-10 Pain Score: 8  Pain Location: R hip Pain Descriptors / Indicators: Aching;Grimacing;Guarding Pain Intervention(s): Limited activity within patient's tolerance;Monitored during session;Repositioned;Premedicated before session  Home Living Family/patient expects to be discharged to:: Private residence Living Arrangements: Spouse/significant other Available Help at Discharge: Family Type of Home: House Home Access: Level entry                                Prior Functioning/Environment              Frequency  Min 2X/week        Progress Toward Goals  OT Goals(current goals can now be found in the care plan section)  Progress towards OT goals: Progressing toward goals  Acute Rehab OT Goals Patient Stated Goal:  to have less pain and return home OT Goal Formulation: With patient Time For Goal Achievement: 02/11/18 Potential to Achieve Goals: Good  Plan Discharge plan remains appropriate    Co-evaluation                 AM-PAC PT "6 Clicks" Daily Activity     Outcome Measure   Help from another person eating meals?: None Help from another person taking care of personal grooming?: None Help from another person toileting, which includes using toliet, bedpan, or urinal?: A Little Help from another person bathing (including washing, rinsing, drying)?: A Little Help from another person to put on and taking off regular upper body clothing?: None Help from another person to put on and taking off regular lower body clothing?: A Little 6 Click Score: 21    End of Session    OT Visit Diagnosis: Other abnormalities of gait and mobility (R26.89);Repeated falls (R29.6);Muscle weakness (generalized) (M62.81);Pain Pain - Right/Left: Right Pain - part of body: Hip;Knee   Activity Tolerance Patient tolerated treatment well   Patient Left Other (comment)(sitting EOB and Galen from PT took over working iwth him )   Copy Other (comment)(380 cc urine in urinal)        Time: 1100-1130 OT Time Calculation (min): 30 min  Charges: OT General Charges $OT Visit: 1 Visit OT Treatments $Self Care/Home Management : 23-37 mins  Chrys Racer, OTR/L ascom [phone] 01/29/18, 12:36 PM

## 2018-01-29 NOTE — Progress Notes (Signed)
   Subjective: 3 Days Post-Op Procedure(s) (LRB): TOTAL HIP ARTHROPLASTY ANTERIOR APPROACH (Right) Patient reports pain as 3 on 0-10 scale.   Patient is Complaining of mild abdominal pain, 2 out of 10.  Mild nausea but no vomiting.  Tolerating p.o. well. Denies any CP, SOB, ABD pain. We will continue therapy today.  Plan is to go Home after hospital stay.  Objective: Vital signs in last 24 hours: Temp:  [98.7 F (37.1 C)-100.2 F (37.9 C)] 100.2 F (37.9 C) (11/21 2319) Pulse Rate:  [81-97] 81 (11/21 2319) Resp:  [19-20] 19 (11/21 2319) BP: (102-147)/(41-75) 102/41 (11/21 2319) SpO2:  [96 %-98 %] 96 % (11/21 2319)  Intake/Output from previous day: 11/21 0701 - 11/22 0700 In: 480 [P.O.:480] Out: 1250 [Urine:1250] Intake/Output this shift: No intake/output data recorded.  Recent Labs    01/27/18 0411  HGB 12.8*   Recent Labs    01/27/18 0411  WBC 9.8  RBC 4.25  HCT 38.7*  PLT 192   Recent Labs    01/27/18 0411  NA 135  K 3.8  CL 102  CO2 25  BUN 9  CREATININE 0.61  GLUCOSE 151*  CALCIUM 8.0*   No results for input(s): LABPT, INR in the last 72 hours.  EXAM General - Patient is Alert, Appropriate and Oriented  Abdomen: Soft nondistended.  Normal bowel sounds minimally tender to palpation. Extremity - Neurovascular intact Sensation intact distally Intact pulses distally Dorsiflexion/Plantar flexion intact No cellulitis present Compartment soft Dressing - dressing C/D/I and no drainage. prevena intact Motor Function - intact, moving foot and toes well on exam.   Past Medical History:  Diagnosis Date  . Anginal pain ([place])   . Back pain    lower  . Claustrophobia   . Diabetes mellitus without complication ([place])   . Dyspnea   . Elevated lipids   . Environmental and seasonal allergies   . GERD (gastroesophageal reflux disease)   . Glaucoma   . HH (hiatus hernia)   . Hip pain Right  . Knee pain    right  . Sleep apnea     Assessment/Plan:    3 Days Post-Op Procedure(s) (LRB): TOTAL HIP ARTHROPLASTY ANTERIOR APPROACH (Right) Active Problems:   Status post total hip replacement, right  Estimated body mass index is 42.33 kg/m as calculated from the following:   Height as of this encounter: 5\' 10"  (1.778 m).   Weight as of this encounter: 133.8 kg. Advance diet Up with therapy, 50% weightbearing right lower extremity x4 weeks Needs bowel movement, recommend suppository today. Low-grade fever. Encourage incentive spirometer. Care management to assist with discharge to home with home health PT, most llikely discharge home today pending bowel movement   DVT Prophylaxis - Aspirin, TED hose and SCDs    T. Rachelle Hora, PA-C [place] 01/29/2018, 7:35 AM

## 2018-01-29 NOTE — Care Management Important Message (Signed)
Copy of signed IM left with patient in room.  

## 2018-01-29 NOTE — Care Management Note (Signed)
Case Management Note  Patient Details  Name: Mario [name] MRN: [id number] Date of Birth: Apr 16, 1950   [name] with [facility] notified of discharge  Subjective/Objective:                    Action/Plan:   Expected Discharge Date:  01/29/18               Expected Discharge Plan:  [name]  In-House Referral:     Discharge planning Services  CM Consult  Post Acute Care Choice:  [place], Durable Medical Equipment Choice offered to:  Patient  DME Arranged:  Bedside commode, Walker wide DME Agency:  [place]:  PT [place]:  [facility]  Status of Service:  Completed, signed off  If discussed at [employer], dates discussed:    Additional Comments:  Beverly Sessions, RN 01/29/2018, 2:28 PM

## 2018-02-01 ENCOUNTER — Telehealth: Payer: Self-pay

## 2018-02-01 NOTE — Telephone Encounter (Signed)
EMMI Follow-up: Noted on the report that patient didn't have a follow-up appointment scheduled yet with the surgeon and had some other questions/problems.  I talked with Mario [name] and they had scheduled him a follow-up appointment on Dec. 4th with the surgeon. He told me he was discharged on Friday, 11/22 and on Saturday, 11/23  Around 6-7 pm the vac started beeping and showing low battery. Patient called [facility] but that person didn't know anything about the vac.  Patient then called Technical support and was told it was defective and to remove it and place tape over the incision. Patient still has a tube sticking out of his hip.  I talked with the [name], Care Manager and she will call [facility] to see if a RN can go out to see the patient and contact him directly with their decision.  Also, I recommended that he contact the surgeon to see if they could see him sooner than Dec. 4th. He depends on his son to transport him so he would see if he could get the time to take him to an earlier appointment. [name], Care Manager did talk with [name] at [facility], Physical Therapy had been to the patients home earlier on Saturday and wondered if patient placed the honeycomb properly to remove the vac. [name] with [facility] will call ortho and make sure they get a sooner follow-up appointment scheduled for him. Physical therapy will be going back on Tuesday for next session.   Patieint said the nursing staff was excellent, especially [name].  He had a panic attack while in the hospital and was trying to [facility] everything to leave but the RN took him outside and sat with him until he got to feeling better. He thought that was exceptional care.      I let him know there would be a second automated call with a different series of questions and to let us know at that time if he had any other concerns. He thanked me for my help.

## 2018-02-03 ENCOUNTER — Telehealth: Payer: Self-pay

## 2018-02-03 NOTE — Care Management (Addendum)
Post discharge note entry 02/03/2018:  RNCM has reached out to [place] with [facility] to check status of patient's wound.  I have also sent message to Dr. Rudene Christians.  RNCM contacted [facility] ortho on-call RN Wed 02/10/18. [facility] is closed Thursday and Friday. I have requested patient be seen by ortho today. RNCM spoke with [name] PT and they will go out to patient's home and assess wound. Patient updated.

## 2018-02-03 NOTE — Telephone Encounter (Signed)
EMMI Follow-up: Late entry from this morning:  Mario [name] called and said that [facility] PT came out again on Tuesday but said he couldn't touch the site and that agency hadn't followed up with getting a nursing order to take a look at the site even though the RN CM had contacted the agency on Monday. Stated he has a budge on the side of his hip, his right knee is blue/purplish in color and itches. Patient had called Dr. Rudene Christians office to see if he needed to get a [place] Popper ride to transport him to [facility] to be seen but still hasn't heard back from the MD office yet.  Patient was waiting for [facility] PT to come around 10 am so I let him know I would follow-up with the RN CM. I talked with the [place] and she contacted [facility] and MD for follow-up (Please reference her note for today, 02/03/18). I apologized to Mario [name] several times and he appreciated me trying to get him some assistance.  Update at 11:17 am: Mario [name] called my number but it was because they had just missed the second automated EMMI call and she said she would hang up so the call could come through.  She said the home health agency had just come and removed the tube so was glad to hear that. I apologized again and let her know they could contact me if they have any further concerns.  Have been directed to share this with Patient Experience by the Lead Case Manager as the patient was unhappy for the slow response and the pain he was in with this process of the vac.

## 2018-02-05 ENCOUNTER — Telehealth: Payer: Self-pay | Admitting: Licensed Clinical Social Worker

## 2018-02-05 NOTE — Telephone Encounter (Signed)
EMMI flagged patient for answering yes to loss of interest in things and yes to feeling sad/hopeless/anxious/empty. Clinical Education officer, museum (CSW) attempted to contact patient however he did not answer and a voicemail was left.   [employer], LCSW [phone]

## 2018-02-06 ENCOUNTER — Telehealth: Payer: Self-pay | Admitting: Licensed Clinical Social Worker

## 2018-02-06 NOTE — Telephone Encounter (Signed)
The CSW reached the patient and discussed his notation on his EMMI call of anhedonia and extreme sadness. The patient denied SI/HI or A/V hallucinations. The patient shared that he has not slept until last evening due to pain, and he shared that today he feels "wonderful". The patient scored an 11 on the PHQ-9, and he stated that he has a hx of anxiety and mild depression. The CSW provided local resources including [facility] and [employer] in addition to the patient's PCP. The patient thanked the CSW.  Santiago Bumpers, MSW, Latanya Presser [phone]

## 2018-02-08 ENCOUNTER — Ambulatory Visit: Admission: RE | Admit: 2018-02-08 | Payer: Medicare Other | Source: Ambulatory Visit

## 2018-02-16 ENCOUNTER — Other Ambulatory Visit: Payer: Medicare Other

## 2018-03-08 ENCOUNTER — Ambulatory Visit: Payer: Medicare Other

## 2018-03-12 ENCOUNTER — Other Ambulatory Visit: Payer: Self-pay | Admitting: Orthopedic Surgery

## 2018-03-12 DIAGNOSIS — Z96641 Presence of right artificial hip joint: Secondary | ICD-10-CM

## 2018-03-12 DIAGNOSIS — G8929 Other chronic pain: Secondary | ICD-10-CM

## 2018-03-12 DIAGNOSIS — M25561 Pain in right knee: Principal | ICD-10-CM

## 2018-03-17 ENCOUNTER — Ambulatory Visit: Payer: Medicare Other

## 2018-03-17 ENCOUNTER — Ambulatory Visit
Admission: RE | Admit: 2018-03-17 | Discharge: 2018-03-17 | Disposition: A | Discharge status: Home / Self Care | Payer: Medicare Other | Source: Ambulatory Visit | Attending: Orthopedic Surgery | Admitting: Orthopedic Surgery

## 2018-03-17 DIAGNOSIS — R1084 Generalized abdominal pain: Secondary | ICD-10-CM | POA: Diagnosis present

## 2018-03-17 DIAGNOSIS — Z96641 Presence of right artificial hip joint: Secondary | ICD-10-CM | POA: Insufficient documentation

## 2018-03-17 DIAGNOSIS — R110 Nausea: Secondary | ICD-10-CM | POA: Insufficient documentation

## 2018-03-17 DIAGNOSIS — G8929 Other chronic pain: Secondary | ICD-10-CM | POA: Insufficient documentation

## 2018-03-17 DIAGNOSIS — K5909 Other constipation: Secondary | ICD-10-CM | POA: Diagnosis present

## 2018-03-17 DIAGNOSIS — M25561 Pain in right knee: Secondary | ICD-10-CM | POA: Insufficient documentation

## 2018-03-17 LAB — POCT I-STAT CREATININE: CREATININE: 0.9 mg/dL (ref 0.61–1.24)

## 2018-03-17 IMAGING — CT CT KNEE*R* W/O CM
2 series · 9 of 18 positions shown, 10 images · non-contrast
Comparison: None.

CLINICAL DATA: Preop right knee replacement

EXAM:
CT OF THE RIGHT KNEE WITHOUT CONTRAST
TECHNIQUE: Multidetector CT imaging of the RIGHT knee was performed according
to the standard protocol. Multiplanar CT image reconstructions were
also generated.

[Series 10: sagittal bone knee · sagittal · 0.37mm/px · 5 of 92 slices shown, 6 images]
[im 16/92  bone]
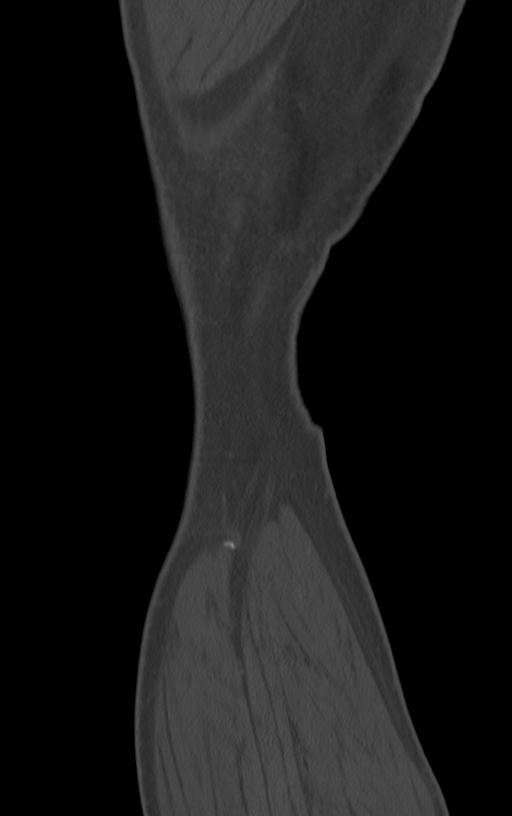
[im 31/92  bone]
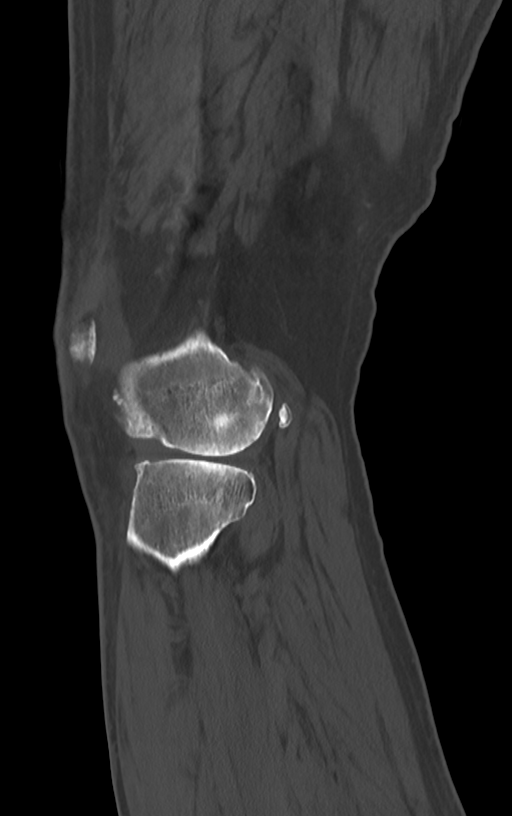
[im 46/92  soft-tissue]
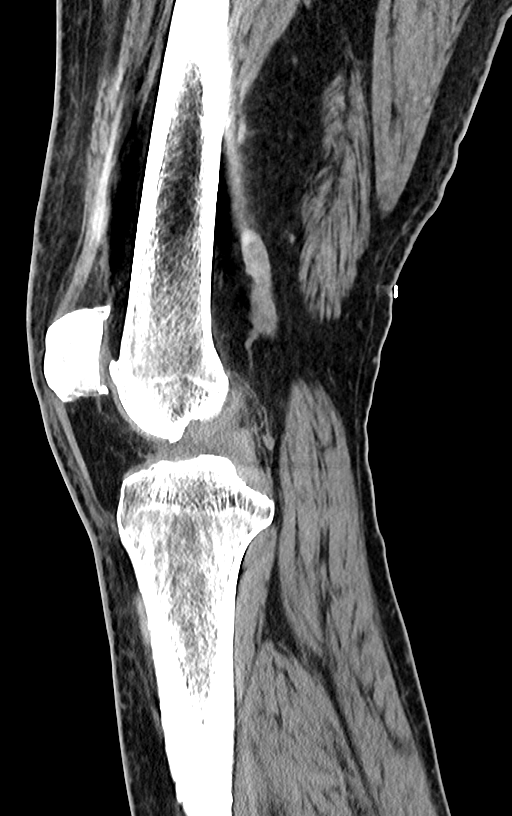
[im 46/92  bone]
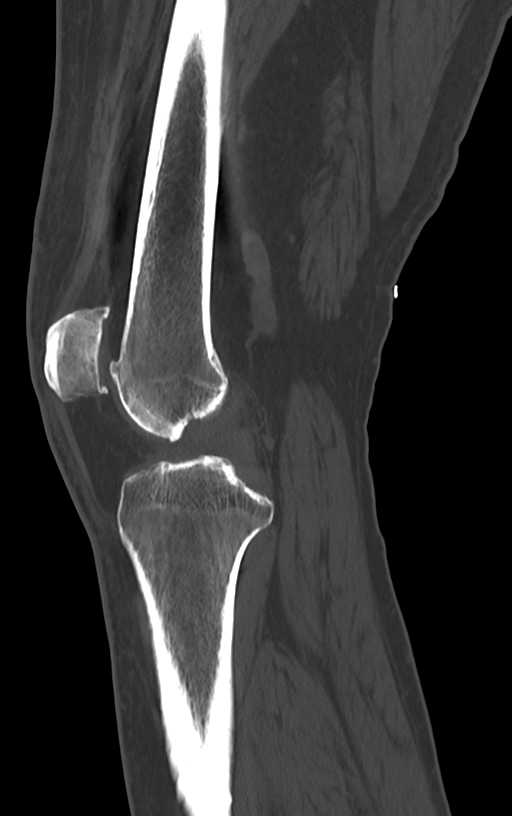
[im 61/92  bone]
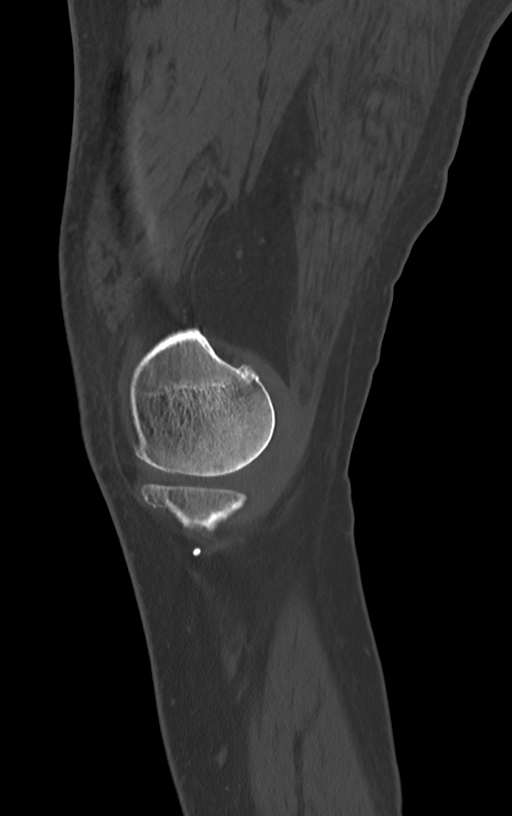
[im 76/92  bone]
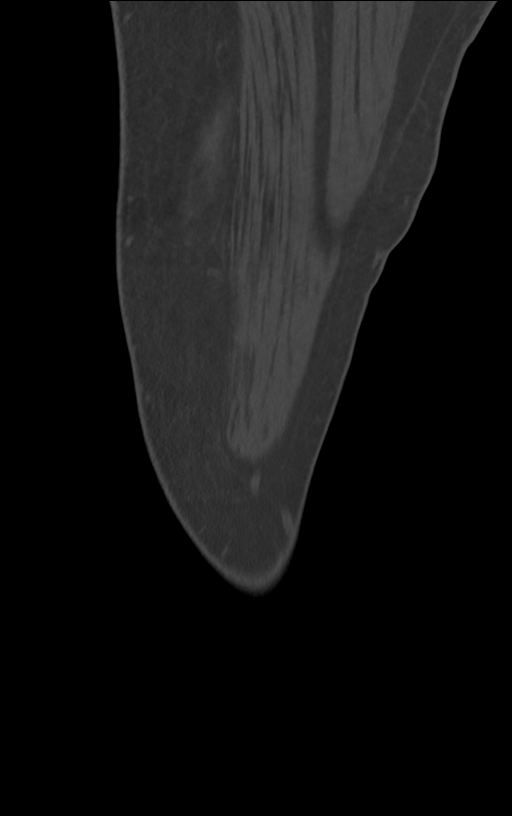

[Series 12: sagittal st knee · sagittal · 0.37mm/px · 4 of 92 slices shown]
[im 19/92  bone]
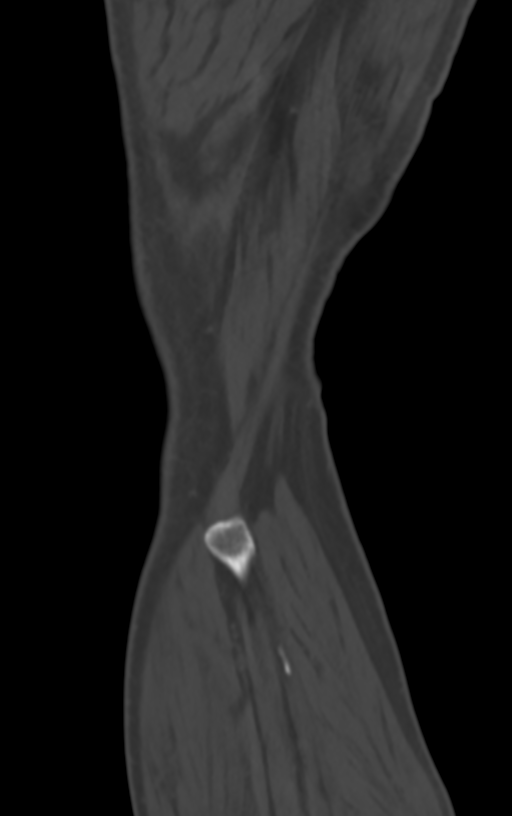
[im 37/92  bone]
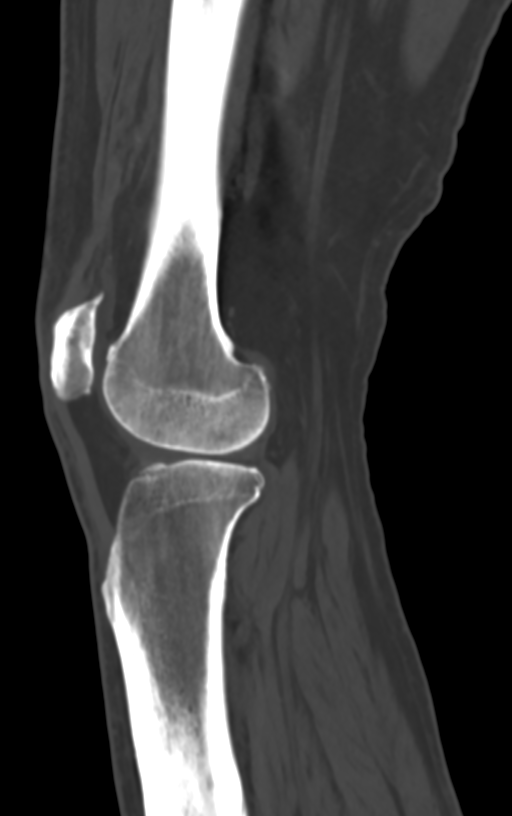
[im 55/92  bone]
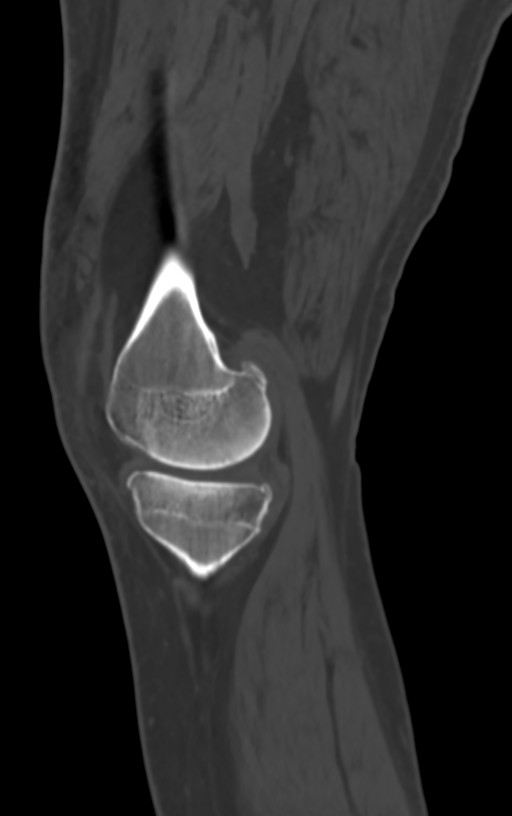
[im 73/92  bone]
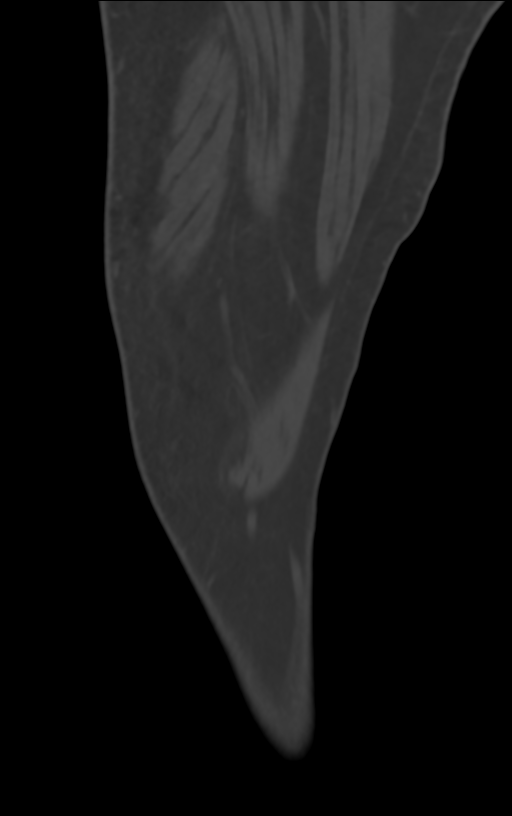

[9 of 18 positions shown; findings below may reference images not displayed]

FINDINGS: Bones/Joint/Cartilage

The hip demonstrates no fracture or dislocation. There is no lytic
or blastic lesion. There is mild osteoarthritis of the right SI
joint. There is a right total hip arthroplasty. There is no hardware
failure or complication.

The knee demonstrates no fracture or dislocation. There is no lytic
or blastic lesion. There is mild patellofemoral compartment joint
space narrowing with marginal osteophytes most severe in the medial
femorotibial compartment. There is severe lateral femorotibial
compartment joint space narrowing with marginal osteophytes and mild
subchondral sclerosis. There is mild-moderate medial femorotibial
compartment joint space narrowing with small marginal osteophytes.
There is a small joint effusion.

The ankle demonstrates no fracture or dislocation. There is no lytic
or blastic lesion.

Ligaments

Suboptimally assessed by CT.

Muscles and Tendons

The muscles are normal.  There is no muscle atrophy.

Soft tissues

There is no fluid collection or hematoma. There is no soft tissue
mass.
IMPRESSION: 1. Tricompartmental osteoarthritis of the right knee.

## 2018-03-17 IMAGING — CT CT KNEE*R* W/O CM
1 series · 12 of 14 positions shown, 15 images · non-contrast
Comparison: None.

CLINICAL DATA: Preop right knee replacement

EXAM:
CT OF THE RIGHT KNEE WITHOUT CONTRAST
TECHNIQUE: Multidetector CT imaging of the RIGHT knee was performed according
to the standard protocol. Multiplanar CT image reconstructions were
also generated.

[Series 4: axial bone knee · axial · 0.36mm/px · z∈[+799,+1059]mm · 12 of 154 slices shown, 15 images]
[im 12/154  soft-tissue]
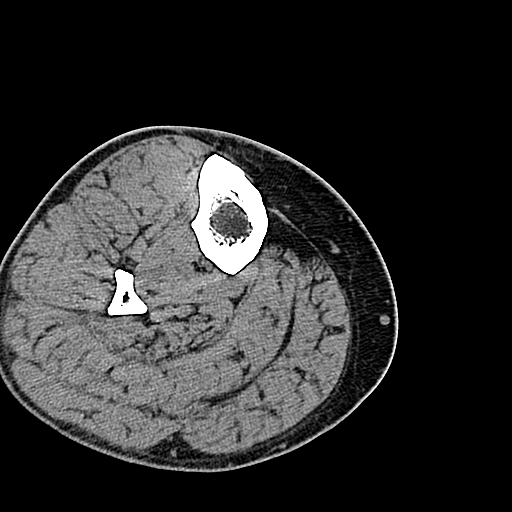
[im 12/154  bone]
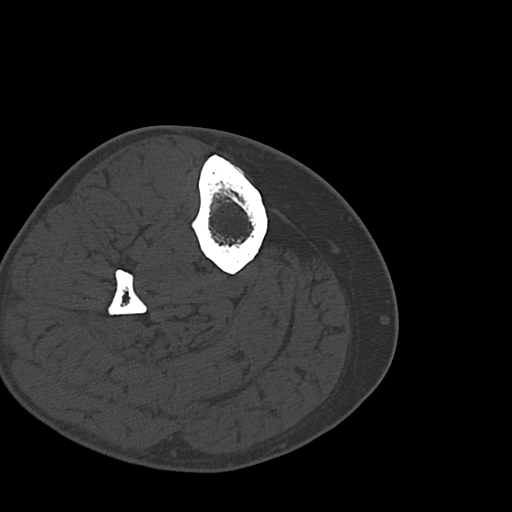
[im 24/154  bone]
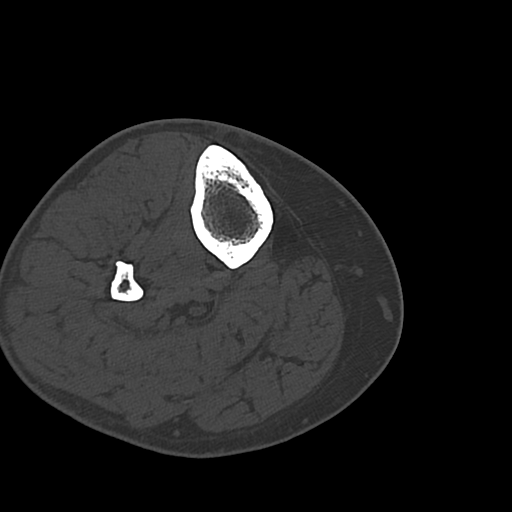
[im 36/154  bone]
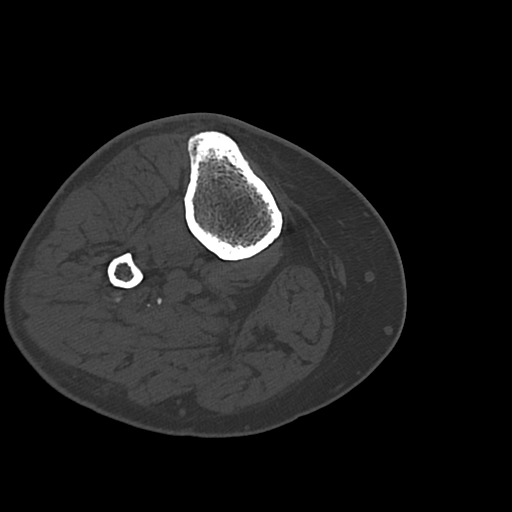
[im 48/154  bone]
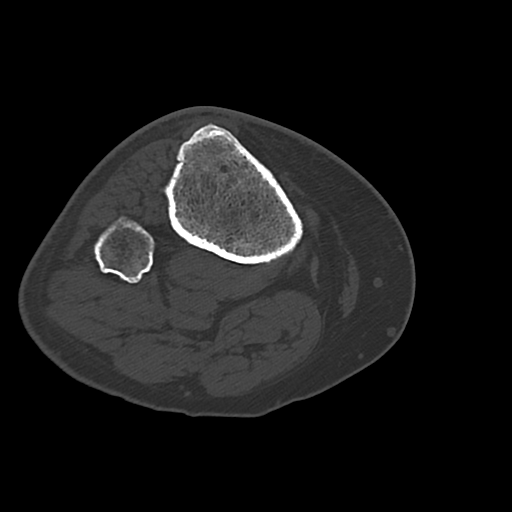
[im 59/154  soft-tissue]
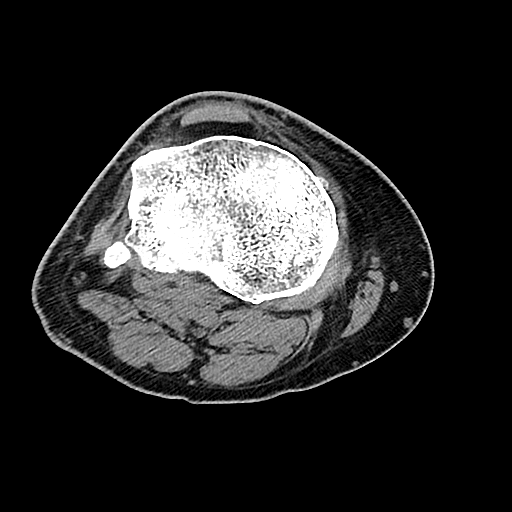
[im 59/154  bone]
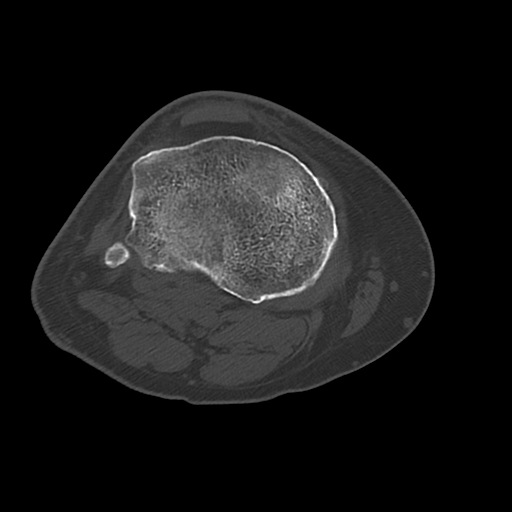
[im 71/154  bone]
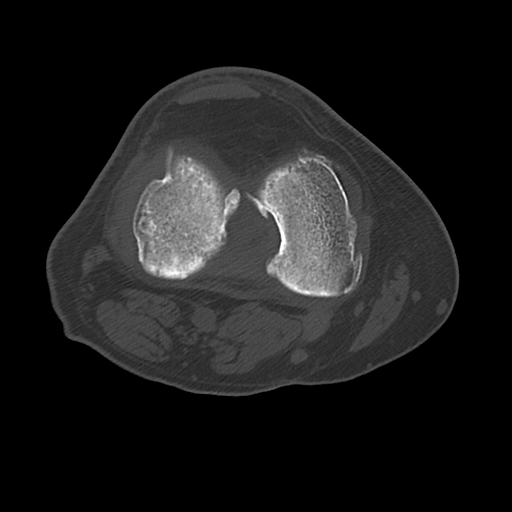
[im 83/154  bone]
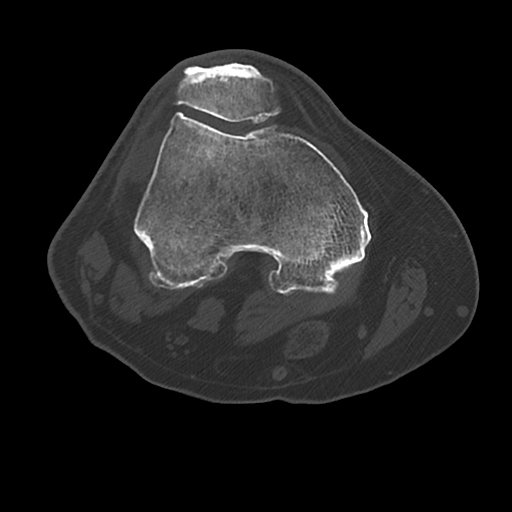
[im 95/154  bone]
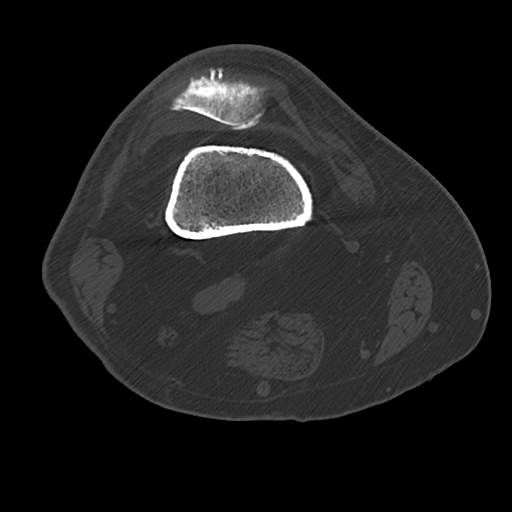
[im 106/154  soft-tissue]
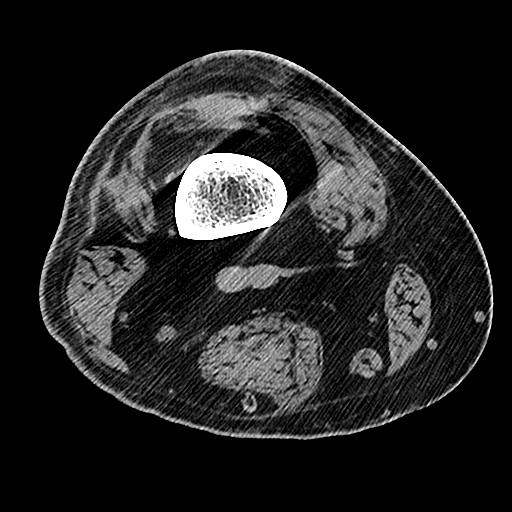
[im 106/154  bone]
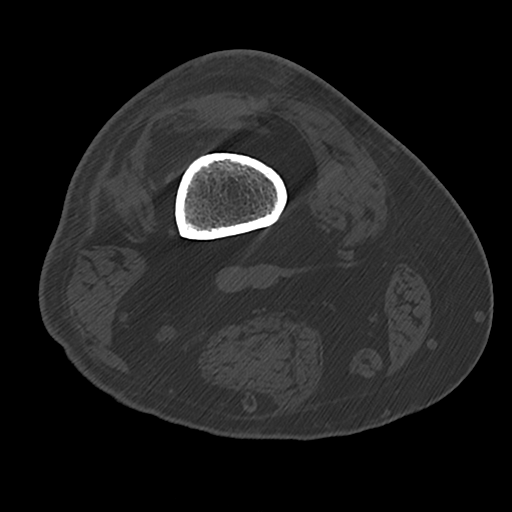
[im 118/154  bone]
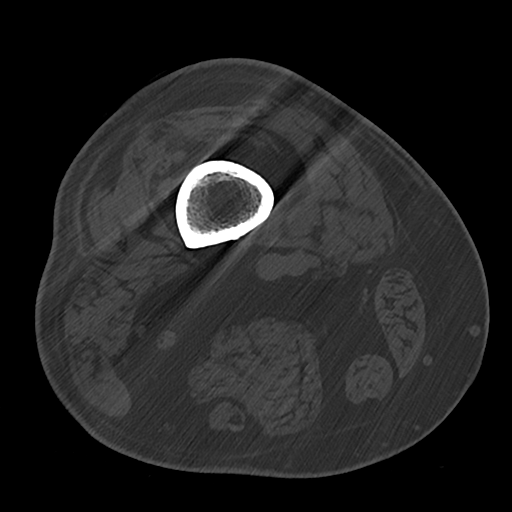
[im 130/154  bone]
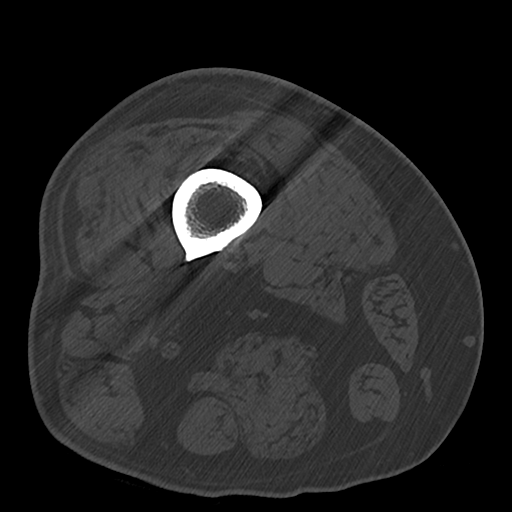
[im 142/154  bone]
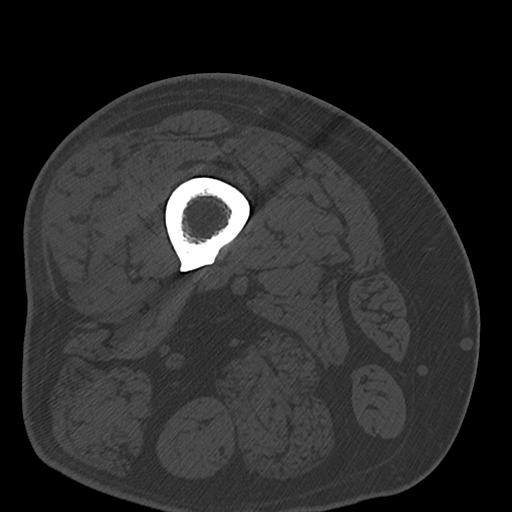

[12 of 14 positions shown; findings below may reference images not displayed]

FINDINGS: Bones/Joint/Cartilage

The hip demonstrates no fracture or dislocation. There is no lytic
or blastic lesion. There is mild osteoarthritis of the right SI
joint. There is a right total hip arthroplasty. There is no hardware
failure or complication.

The knee demonstrates no fracture or dislocation. There is no lytic
or blastic lesion. There is mild patellofemoral compartment joint
space narrowing with marginal osteophytes most severe in the medial
femorotibial compartment. There is severe lateral femorotibial
compartment joint space narrowing with marginal osteophytes and mild
subchondral sclerosis. There is mild-moderate medial femorotibial
compartment joint space narrowing with small marginal osteophytes.
There is a small joint effusion.

The ankle demonstrates no fracture or dislocation. There is no lytic
or blastic lesion.

Ligaments

Suboptimally assessed by CT.

Muscles and Tendons

The muscles are normal.  There is no muscle atrophy.

Soft tissues

There is no fluid collection or hematoma. There is no soft tissue
mass.
IMPRESSION: 1. Tricompartmental osteoarthritis of the right knee.

## 2018-03-17 IMAGING — CT CT KNEE*R* W/O CM
1 series · 12 of 14 positions shown, 15 images · non-contrast
Comparison: None.

CLINICAL DATA: Preop right knee replacement

EXAM:
CT OF THE RIGHT KNEE WITHOUT CONTRAST
TECHNIQUE: Multidetector CT imaging of the RIGHT knee was performed according
to the standard protocol. Multiplanar CT image reconstructions were
also generated.

[Series 3: hip · axial · 0.42mm/px · z∈[+1229,+1417]mm · 12 of 112 slices shown, 15 images]
[im 9/112  soft-tissue]
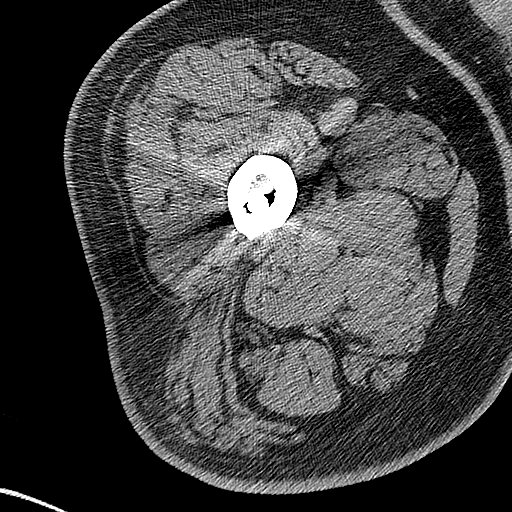
[im 9/112  bone]
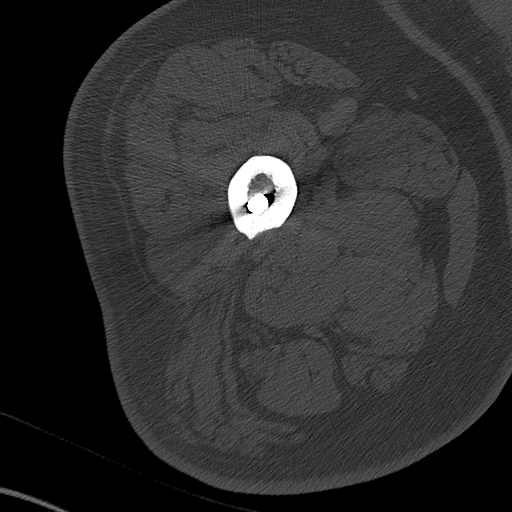
[im 18/112  bone]
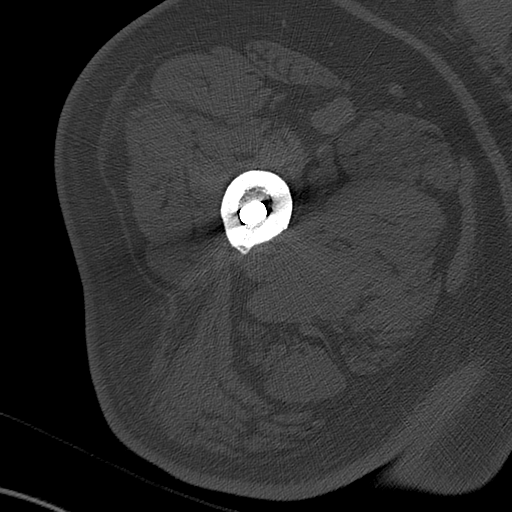
[im 26/112  bone]
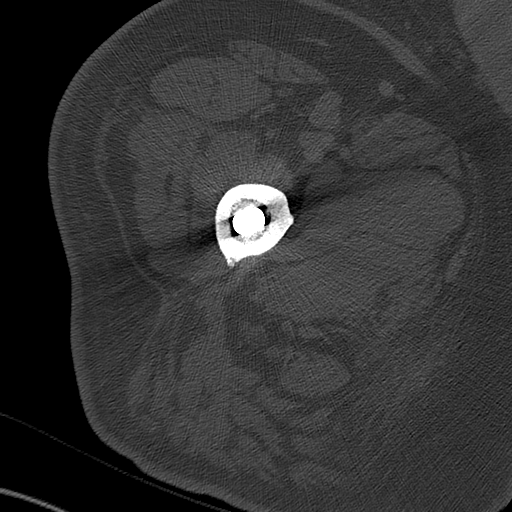
[im 35/112  bone]
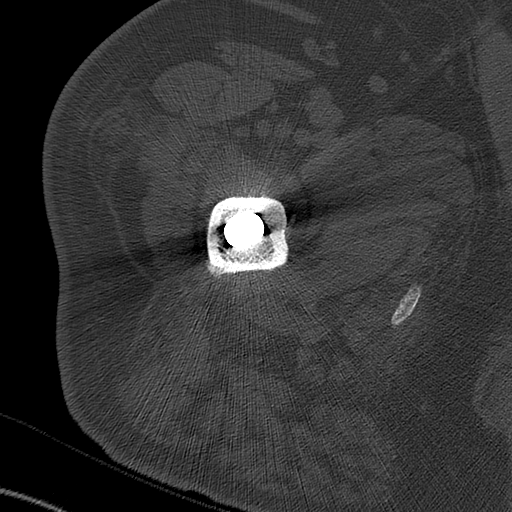
[im 43/112  soft-tissue]
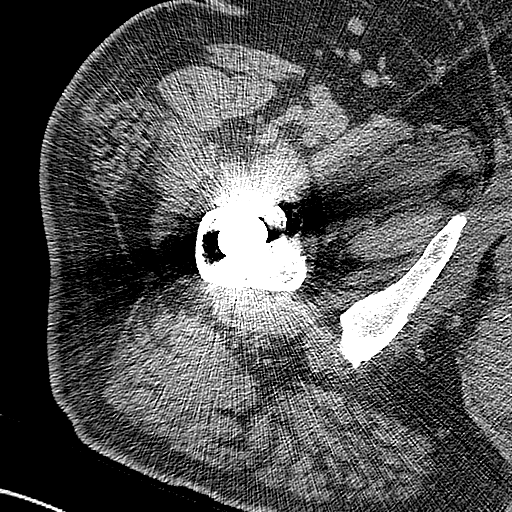
[im 43/112  bone]
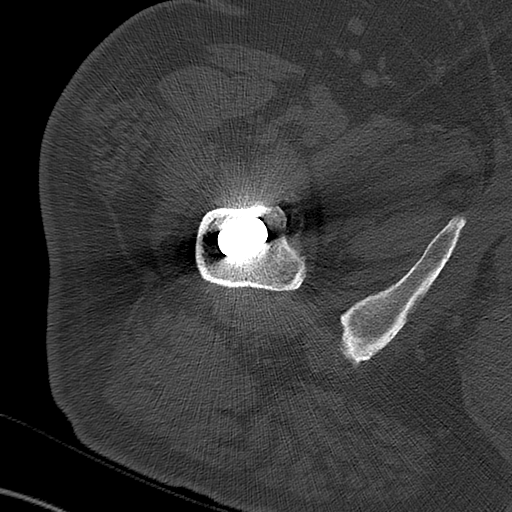
[im 52/112  bone]
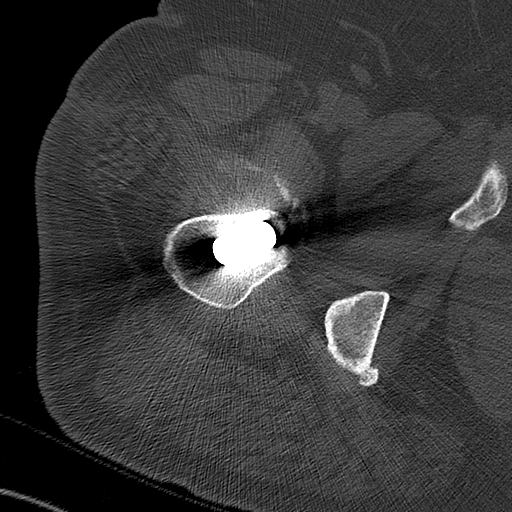
[im 60/112  bone]
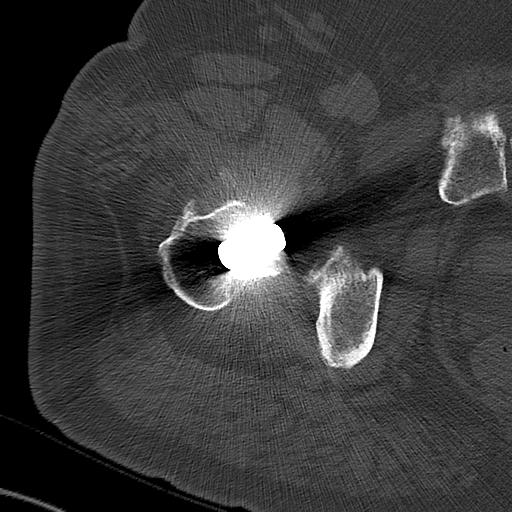
[im 69/112  bone]
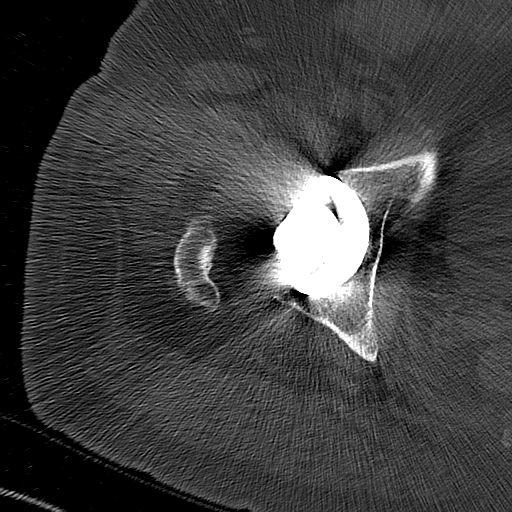
[im 77/112  soft-tissue]
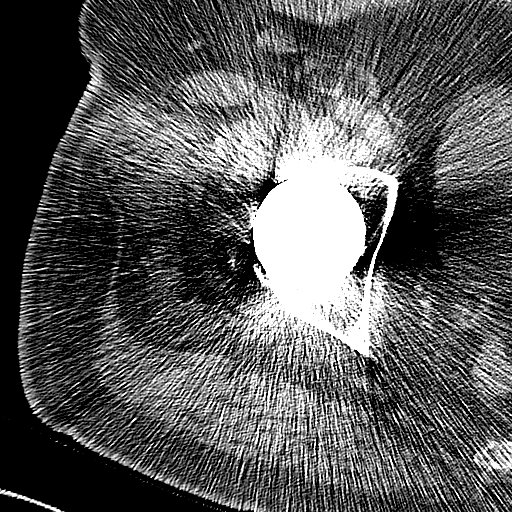
[im 77/112  bone]
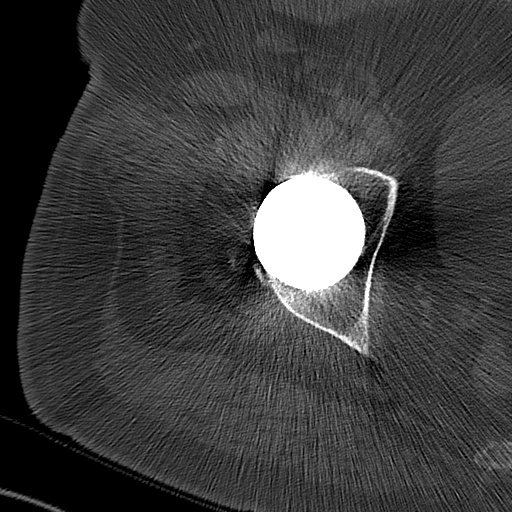
[im 86/112  bone]
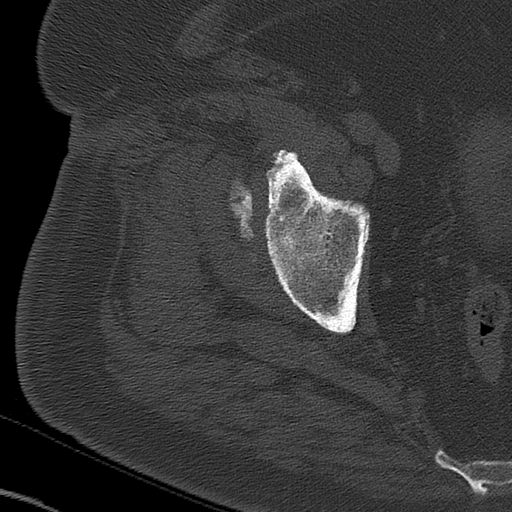
[im 94/112  bone]
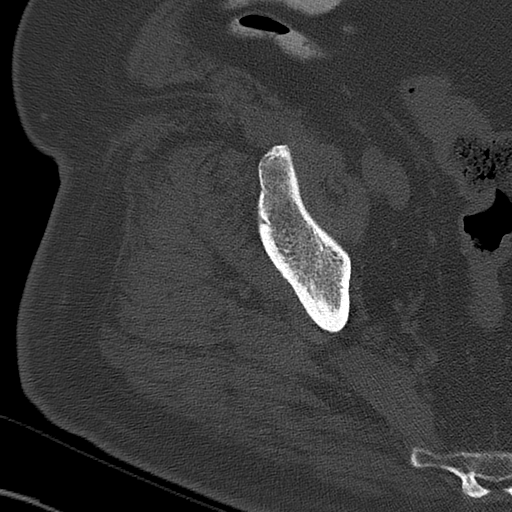
[im 103/112  bone]
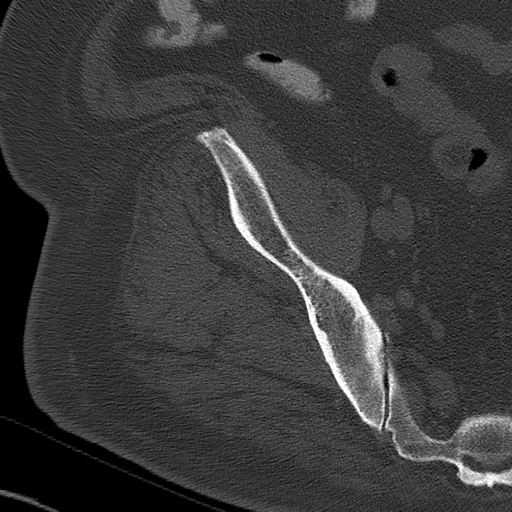

[12 of 14 positions shown; findings below may reference images not displayed]

FINDINGS: Bones/Joint/Cartilage

The hip demonstrates no fracture or dislocation. There is no lytic
or blastic lesion. There is mild osteoarthritis of the right SI
joint. There is a right total hip arthroplasty. There is no hardware
failure or complication.

The knee demonstrates no fracture or dislocation. There is no lytic
or blastic lesion. There is mild patellofemoral compartment joint
space narrowing with marginal osteophytes most severe in the medial
femorotibial compartment. There is severe lateral femorotibial
compartment joint space narrowing with marginal osteophytes and mild
subchondral sclerosis. There is mild-moderate medial femorotibial
compartment joint space narrowing with small marginal osteophytes.
There is a small joint effusion.

The ankle demonstrates no fracture or dislocation. There is no lytic
or blastic lesion.

Ligaments

Suboptimally assessed by CT.

Muscles and Tendons

The muscles are normal.  There is no muscle atrophy.

Soft tissues

There is no fluid collection or hematoma. There is no soft tissue
mass.
IMPRESSION: 1. Tricompartmental osteoarthritis of the right knee.

## 2018-03-17 IMAGING — CT CT KNEE*R* W/O CM
2 of 3 series · 13 of 20 positions shown, 16 images · non-contrast
Comparison: None.

CLINICAL DATA: Preop right knee replacement

EXAM:
CT OF THE RIGHT KNEE WITHOUT CONTRAST
TECHNIQUE: Multidetector CT imaging of the RIGHT knee was performed according
to the standard protocol. Multiplanar CT image reconstructions were
also generated.

[Series 5: axial st knee (person_name) · axial · 0.36mm/px · z∈[+799,+1059]mm · 12 of 154 slices shown, 15 images]
[im 12/154  soft-tissue]
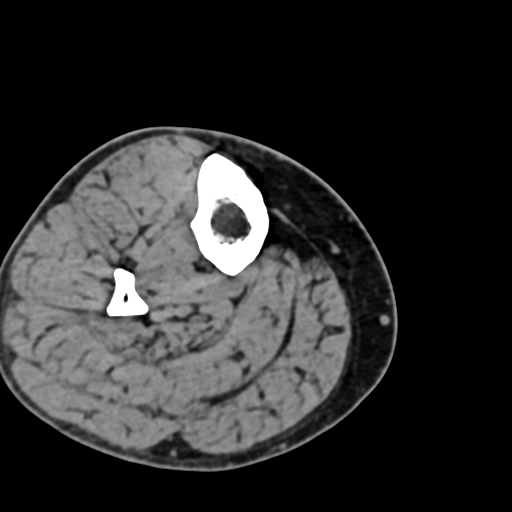
[im 12/154  bone]
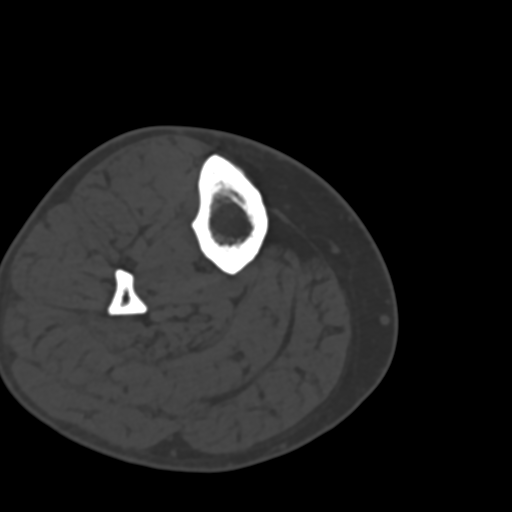
[im 24/154  bone]
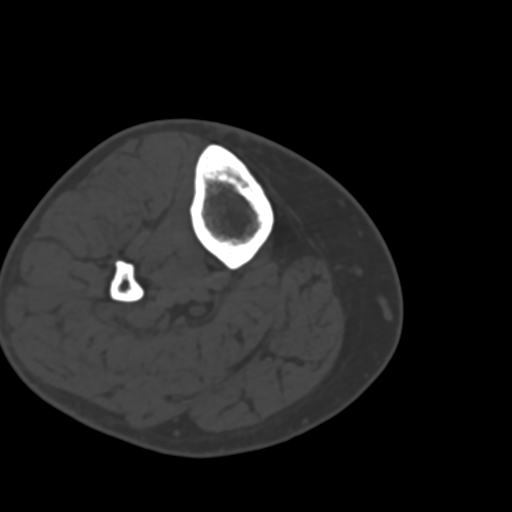
[im 36/154  bone]
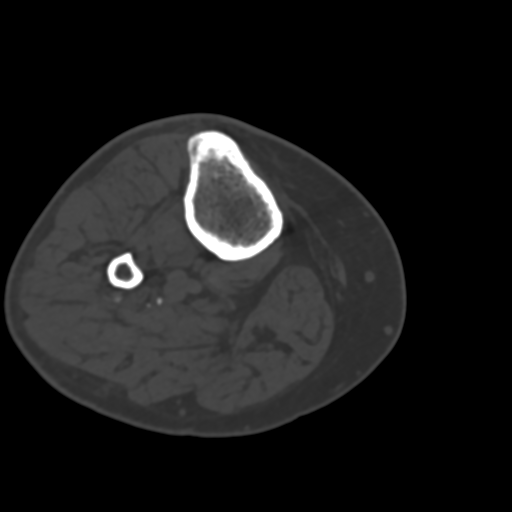
[im 48/154  bone]
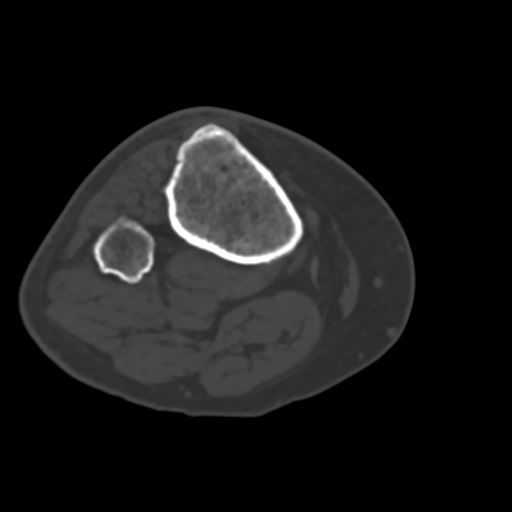
[im 59/154  soft-tissue]
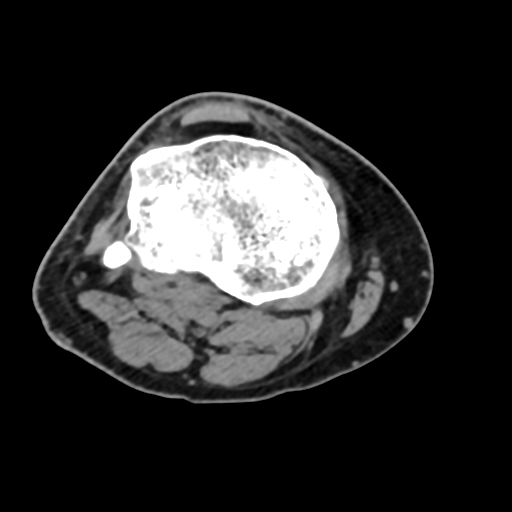
[im 59/154  bone]
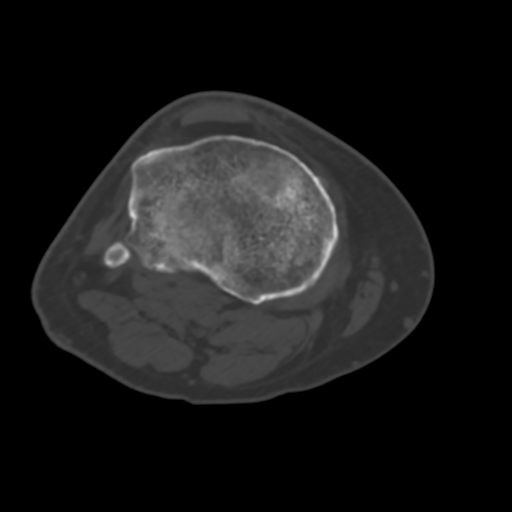
[im 71/154  bone]
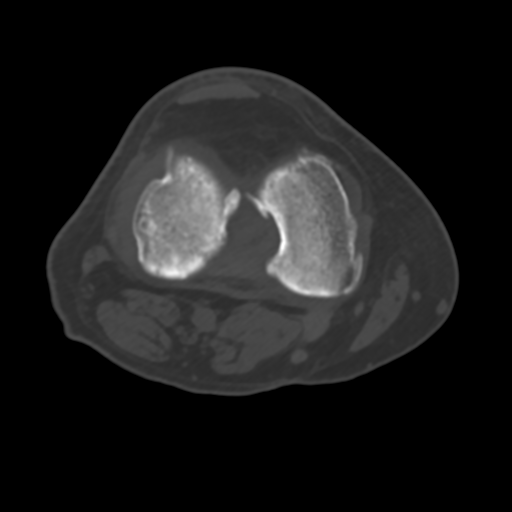
[im 83/154  bone]
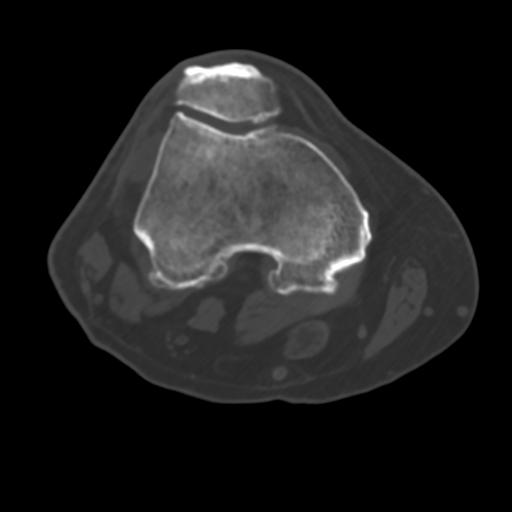
[im 95/154  bone]
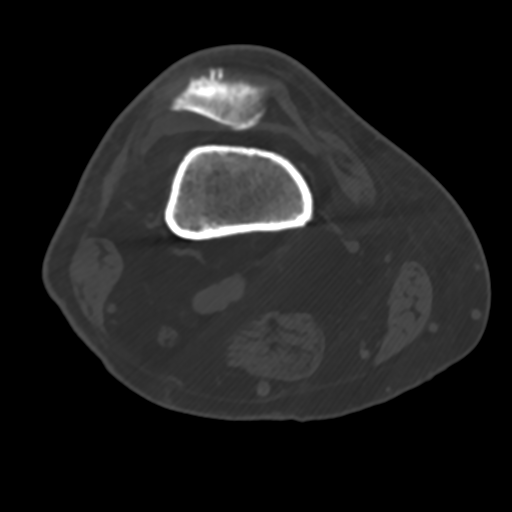
[im 106/154  soft-tissue]
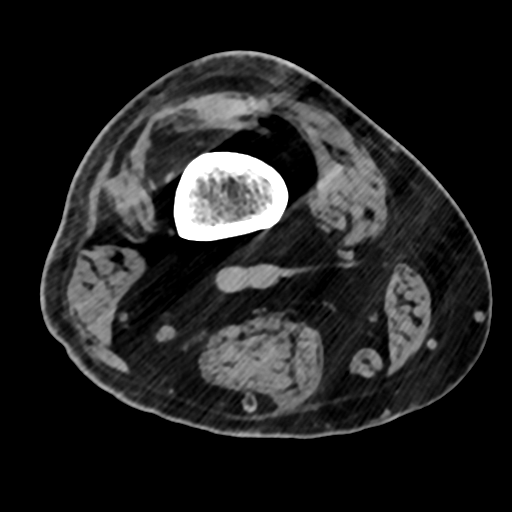
[im 106/154  bone]
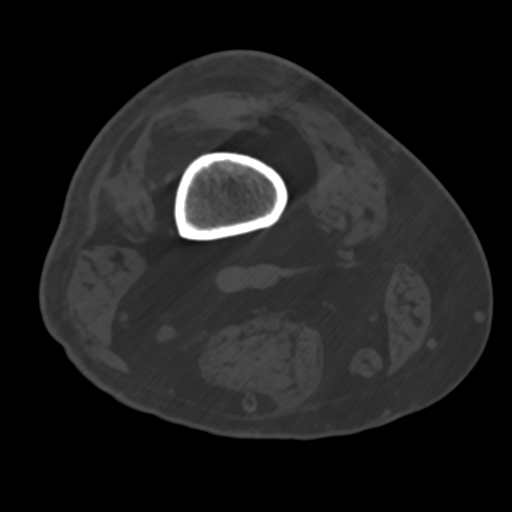
[im 118/154  bone]
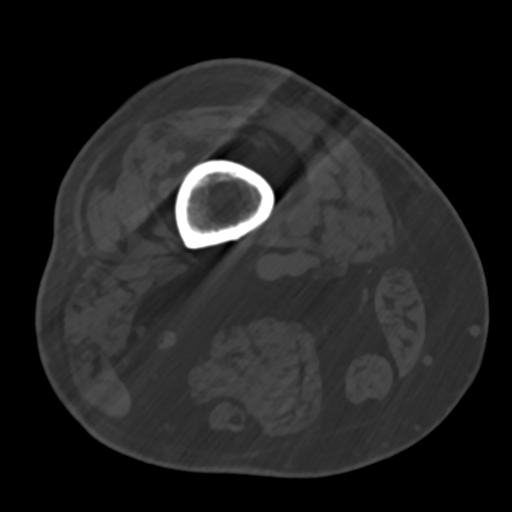
[im 130/154  bone]
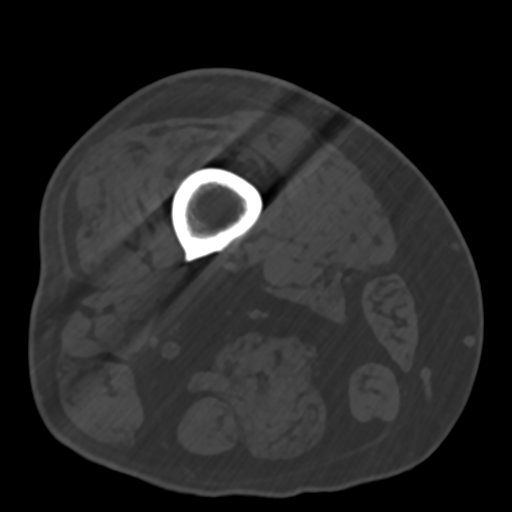
[im 142/154  bone]
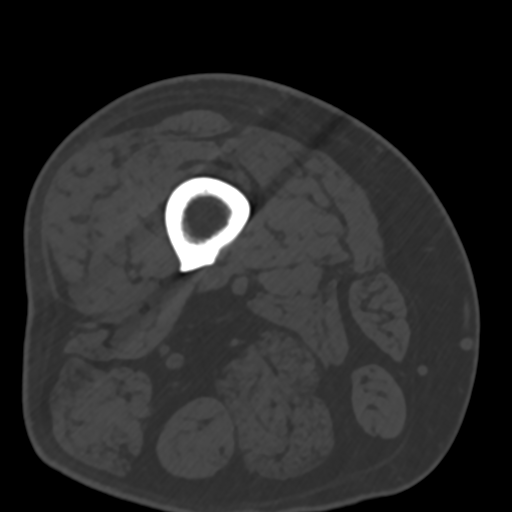

[Series 6: coronal bone knee · coronal · 0.36mm/px · 1 of 95 slices shown]
[im 48/95  bone]
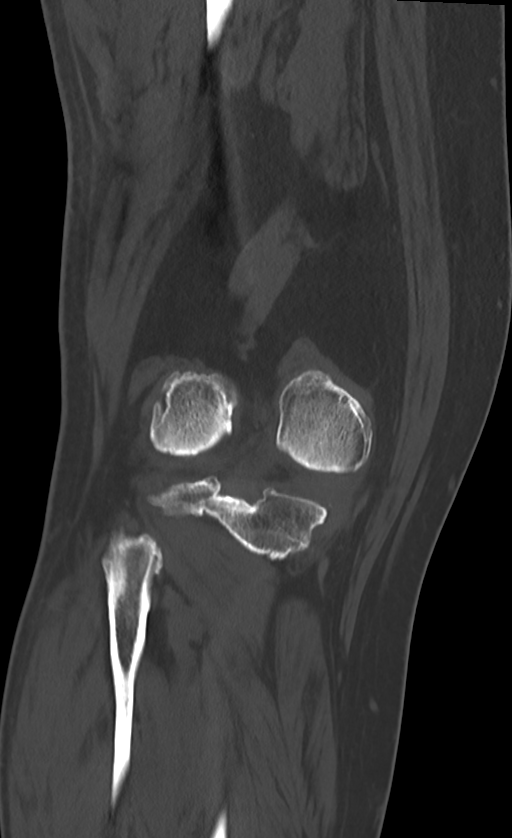

[13 of 20 positions shown; findings below may reference images not displayed]

FINDINGS: Bones/Joint/Cartilage

The hip demonstrates no fracture or dislocation. There is no lytic
or blastic lesion. There is mild osteoarthritis of the right SI
joint. There is a right total hip arthroplasty. There is no hardware
failure or complication.

The knee demonstrates no fracture or dislocation. There is no lytic
or blastic lesion. There is mild patellofemoral compartment joint
space narrowing with marginal osteophytes most severe in the medial
femorotibial compartment. There is severe lateral femorotibial
compartment joint space narrowing with marginal osteophytes and mild
subchondral sclerosis. There is mild-moderate medial femorotibial
compartment joint space narrowing with small marginal osteophytes.
There is a small joint effusion.

The ankle demonstrates no fracture or dislocation. There is no lytic
or blastic lesion.

Ligaments

Suboptimally assessed by CT.

Muscles and Tendons

The muscles are normal.  There is no muscle atrophy.

Soft tissues

There is no fluid collection or hematoma. There is no soft tissue
mass.
IMPRESSION: 1. Tricompartmental osteoarthritis of the right knee.

## 2018-03-17 IMAGING — CT CT KNEE*R* W/O CM
1 series · 1 of 1 positions shown · non-contrast
Comparison: None.

CLINICAL DATA: Preop right knee replacement

EXAM:
CT OF THE RIGHT KNEE WITHOUT CONTRAST
TECHNIQUE: Multidetector CT imaging of the RIGHT knee was performed according
to the standard protocol. Multiplanar CT image reconstructions were
also generated.

[Series 1: topogram · coronal · 2.00mm/px · 1 of 1 slices shown]
[im 1/1]
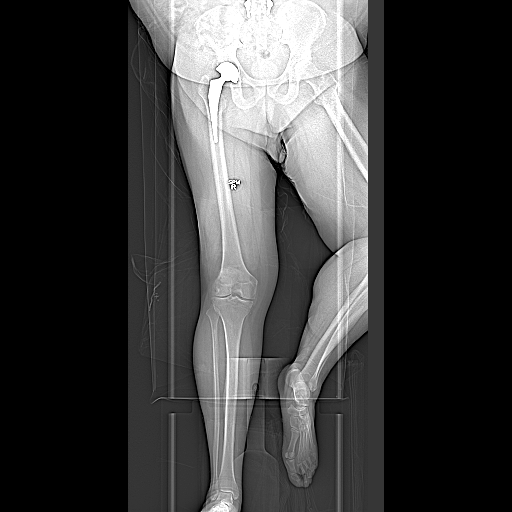

[1 of 1 positions shown; findings below may reference images not displayed]

FINDINGS: Bones/Joint/Cartilage

The hip demonstrates no fracture or dislocation. There is no lytic
or blastic lesion. There is mild osteoarthritis of the right SI
joint. There is a right total hip arthroplasty. There is no hardware
failure or complication.

The knee demonstrates no fracture or dislocation. There is no lytic
or blastic lesion. There is mild patellofemoral compartment joint
space narrowing with marginal osteophytes most severe in the medial
femorotibial compartment. There is severe lateral femorotibial
compartment joint space narrowing with marginal osteophytes and mild
subchondral sclerosis. There is mild-moderate medial femorotibial
compartment joint space narrowing with small marginal osteophytes.
There is a small joint effusion.

The ankle demonstrates no fracture or dislocation. There is no lytic
or blastic lesion.

Ligaments

Suboptimally assessed by CT.

Muscles and Tendons

The muscles are normal.  There is no muscle atrophy.

Soft tissues

There is no fluid collection or hematoma. There is no soft tissue
mass.
IMPRESSION: 1. Tricompartmental osteoarthritis of the right knee.

## 2018-03-17 IMAGING — CT CT KNEE*R* W/O CM
1 series · 12 of 14 positions shown, 15 images · non-contrast
Comparison: None.

CLINICAL DATA: Preop right knee replacement

EXAM:
CT OF THE RIGHT KNEE WITHOUT CONTRAST
TECHNIQUE: Multidetector CT imaging of the RIGHT knee was performed according
to the standard protocol. Multiplanar CT image reconstructions were
also generated.

[Series 24: axial bone ankle · axial · 0.27mm/px · z∈[+488,+578]mm · 12 of 55 slices shown, 15 images]
[im 5/55  soft-tissue]
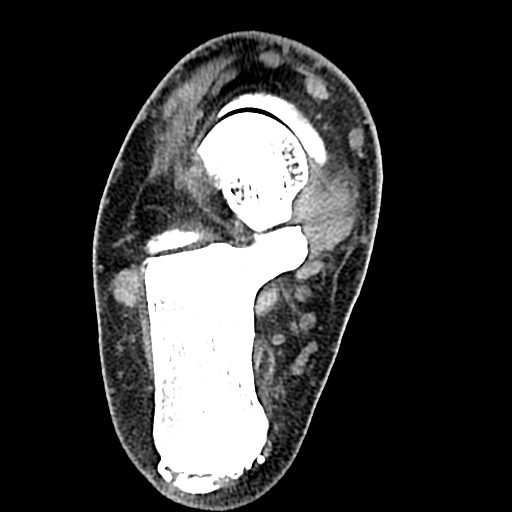
[im 5/55  bone]
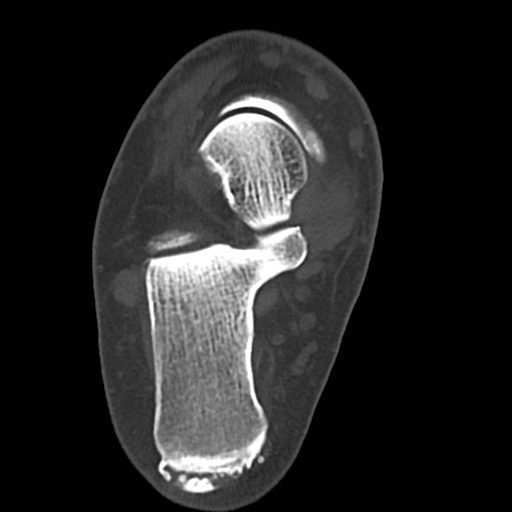
[im 9/55  bone]
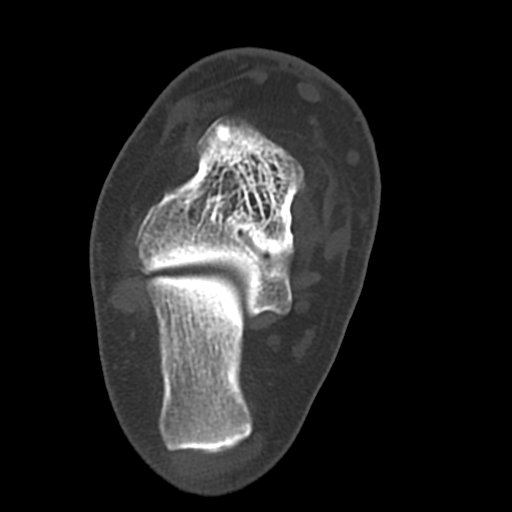
[im 13/55  bone]
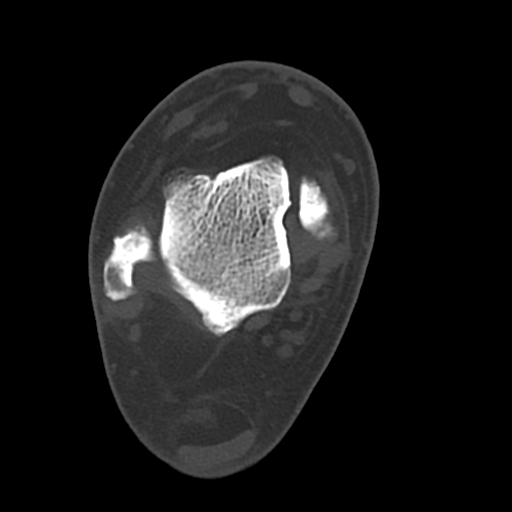
[im 17/55  bone]
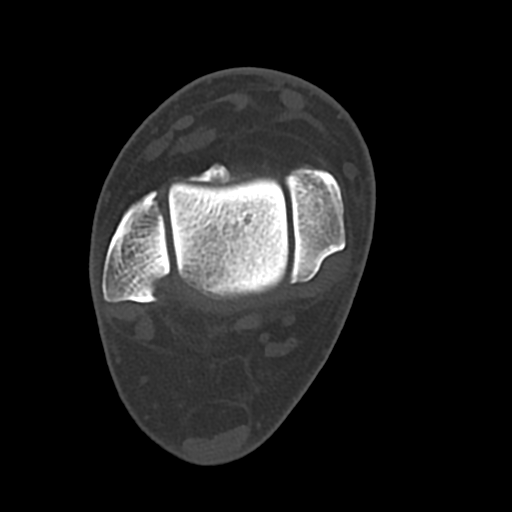
[im 21/55  soft-tissue]
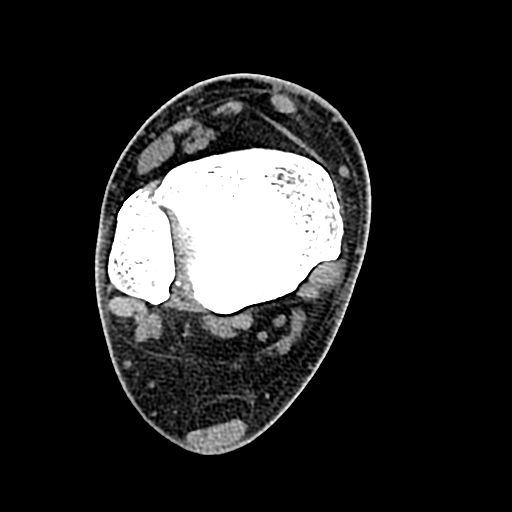
[im 21/55  bone]
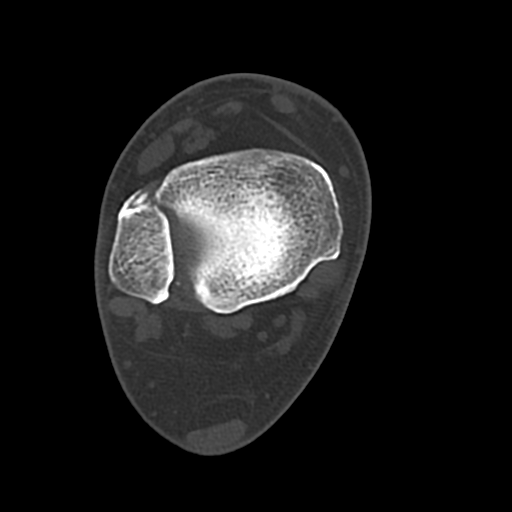
[im 25/55  bone]
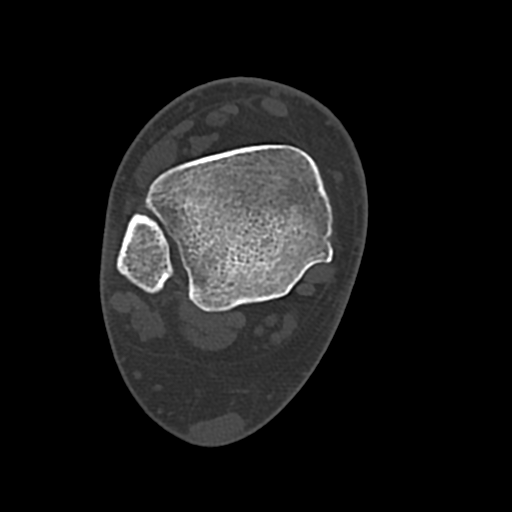
[im 30/55  bone]
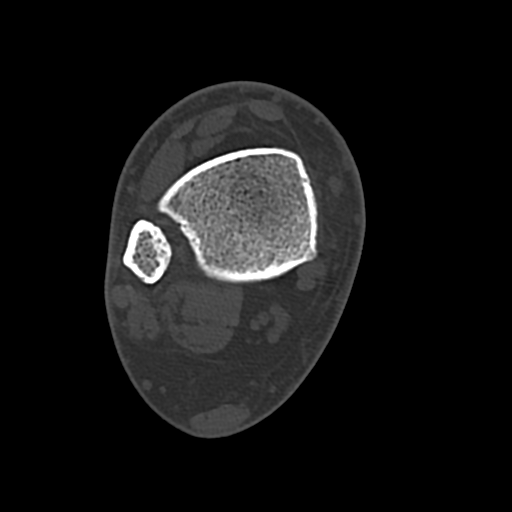
[im 34/55  bone]
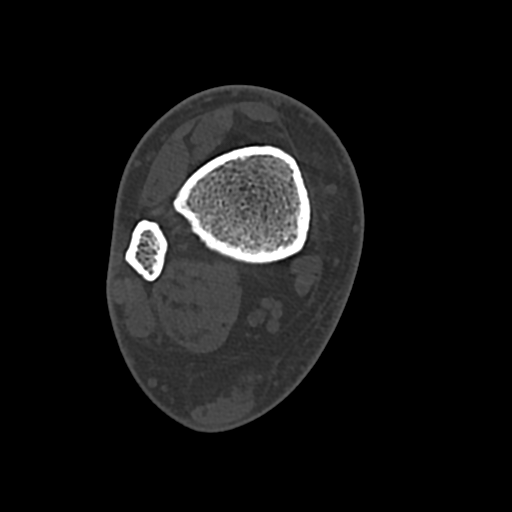
[im 38/55  soft-tissue]
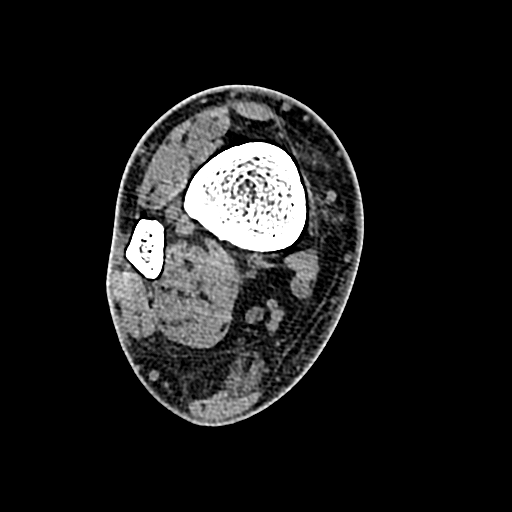
[im 38/55  bone]
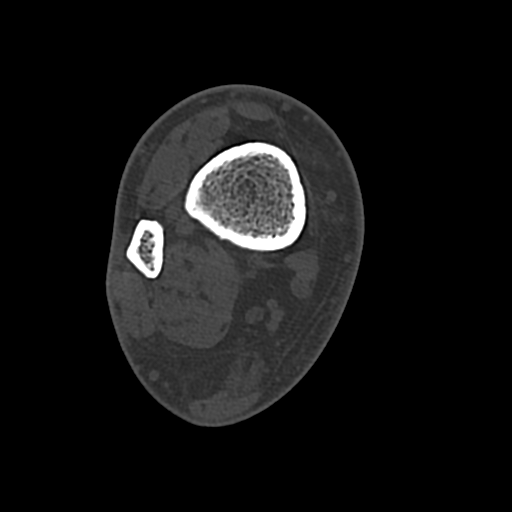
[im 42/55  bone]
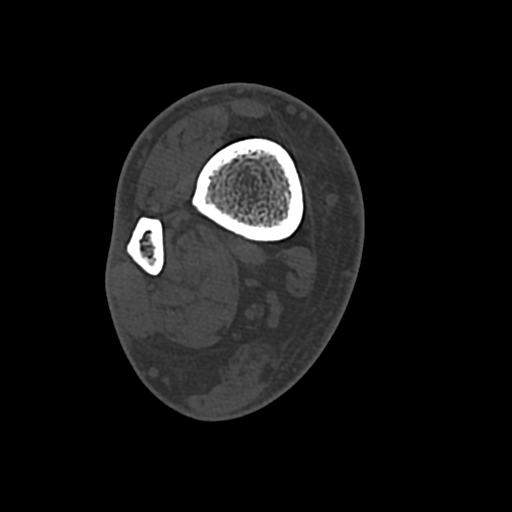
[im 46/55  bone]
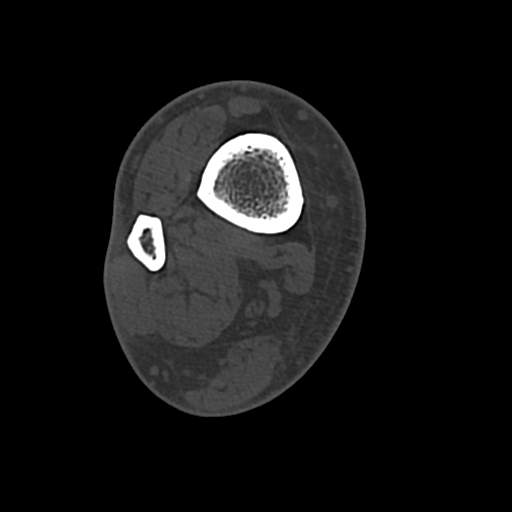
[im 50/55  bone]
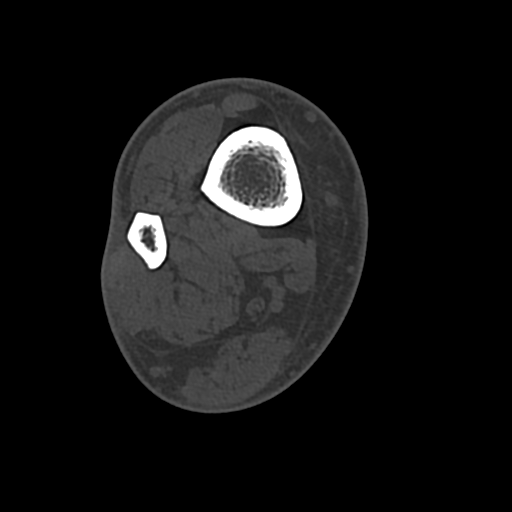

[12 of 14 positions shown; findings below may reference images not displayed]

FINDINGS: Bones/Joint/Cartilage

The hip demonstrates no fracture or dislocation. There is no lytic
or blastic lesion. There is mild osteoarthritis of the right SI
joint. There is a right total hip arthroplasty. There is no hardware
failure or complication.

The knee demonstrates no fracture or dislocation. There is no lytic
or blastic lesion. There is mild patellofemoral compartment joint
space narrowing with marginal osteophytes most severe in the medial
femorotibial compartment. There is severe lateral femorotibial
compartment joint space narrowing with marginal osteophytes and mild
subchondral sclerosis. There is mild-moderate medial femorotibial
compartment joint space narrowing with small marginal osteophytes.
There is a small joint effusion.

The ankle demonstrates no fracture or dislocation. There is no lytic
or blastic lesion.

Ligaments

Suboptimally assessed by CT.

Muscles and Tendons

The muscles are normal.  There is no muscle atrophy.

Soft tissues

There is no fluid collection or hematoma. There is no soft tissue
mass.
IMPRESSION: 1. Tricompartmental osteoarthritis of the right knee.

## 2018-03-17 IMAGING — CT CT ABD-PELV W/ CM
2 of 5 series · 14 of 46 positions shown, 16 images · IV contrast (iopamidol)
Comparison: None.

CLINICAL DATA: Abdominal pain and bloating.  Nausea and vomiting.

EXAM:
CT ABDOMEN AND PELVIS WITH CONTRAST
TECHNIQUE: Multidetector CT imaging of the abdomen and pelvis was performed
using the standard protocol following bolus administration of
intravenous contrast.
CONTRAST:  100mL [7M] IOPAMIDOL ([7M]) INJECTION 61%

[Series 17: abd pelvis (person_name) · axial · 0.98mm/px · z∈[+1291,+1711]mm · 11 of 100 slices shown, 13 images (1 of 2)]
[im 8/100  soft-tissue]
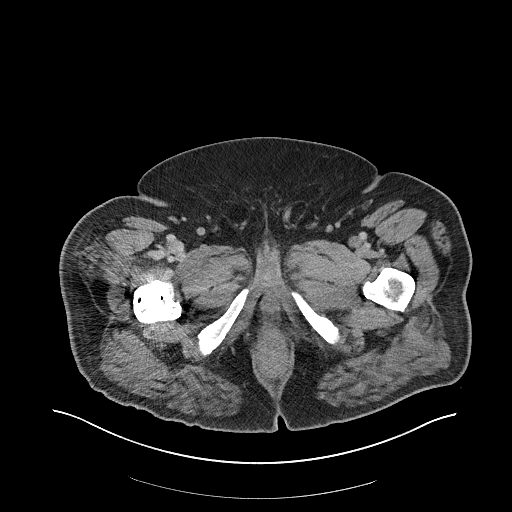
[im 8/100  bone]
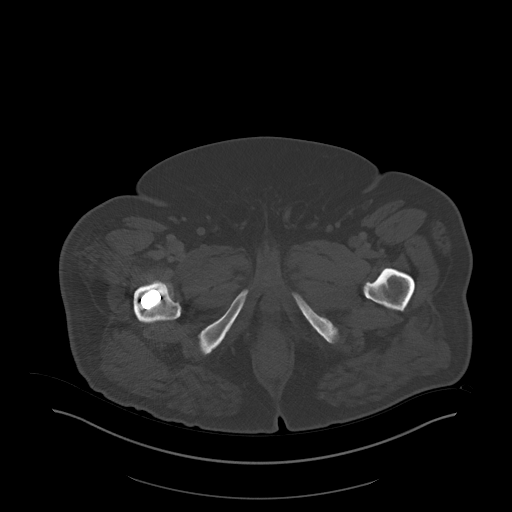
[im 15/100  soft-tissue]
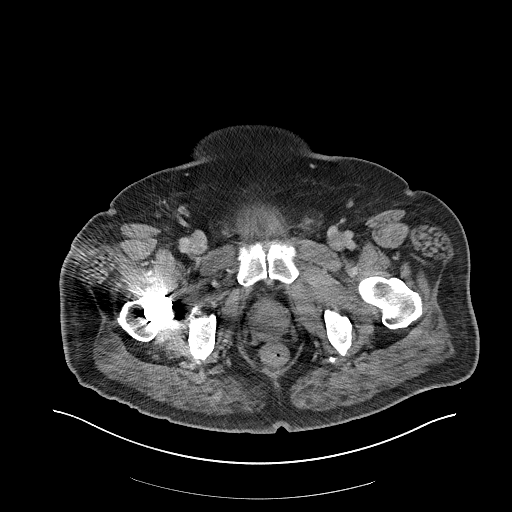
[im 22/100  soft-tissue]
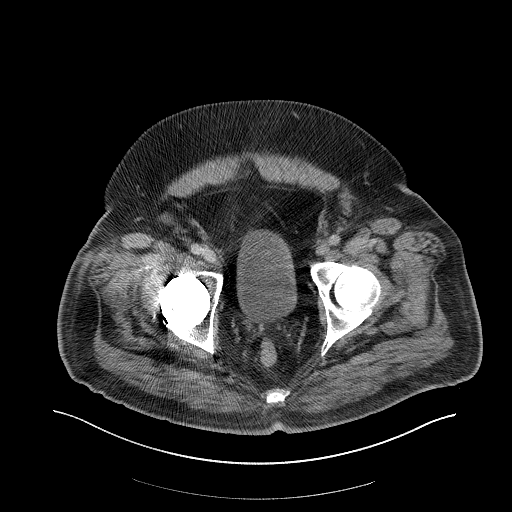
[im 36/100  soft-tissue]
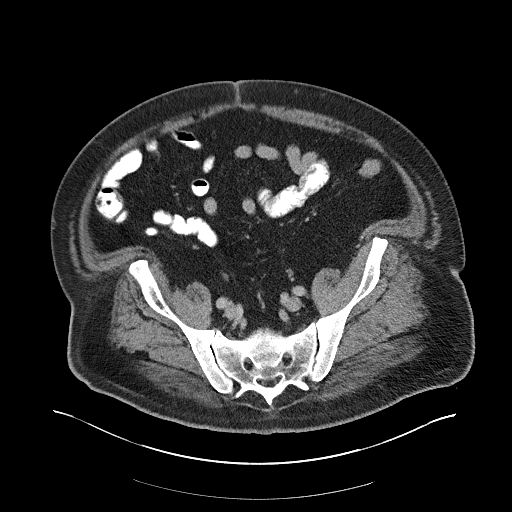
[im 43/100  soft-tissue]
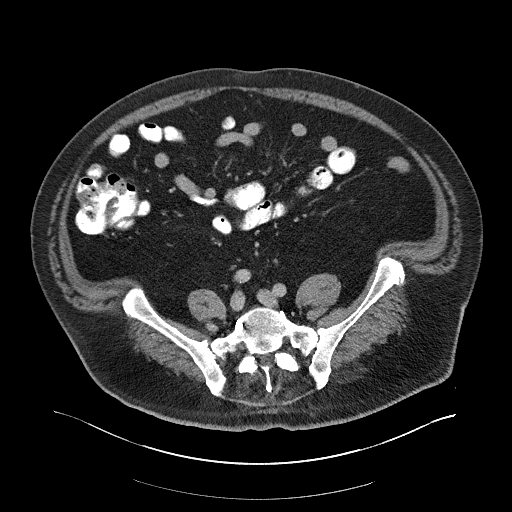
[im 50/100  soft-tissue]
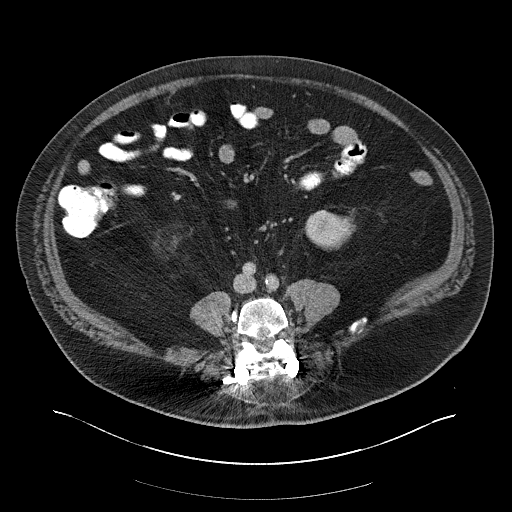
[im 57/100  soft-tissue]
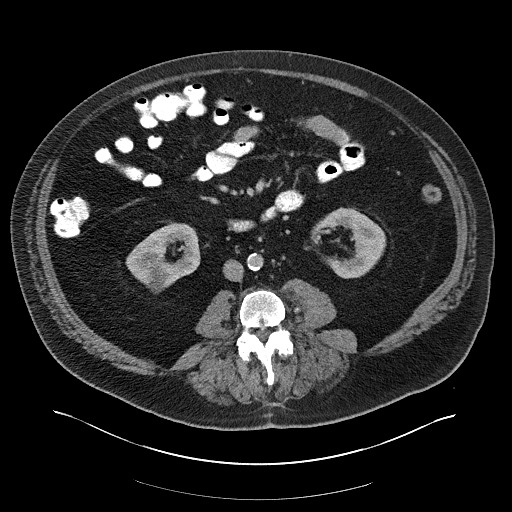
[im 64/100  soft-tissue]
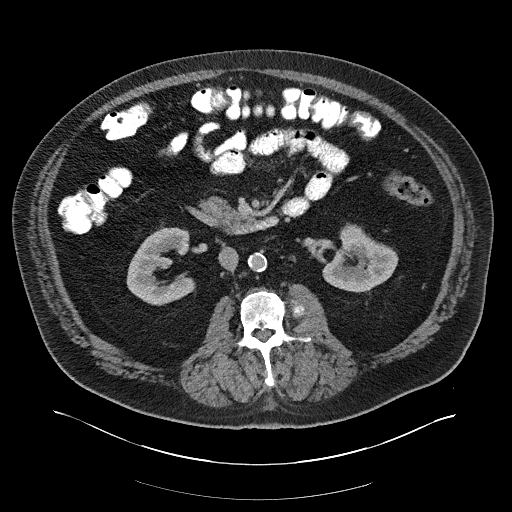
[im 78/100  soft-tissue]
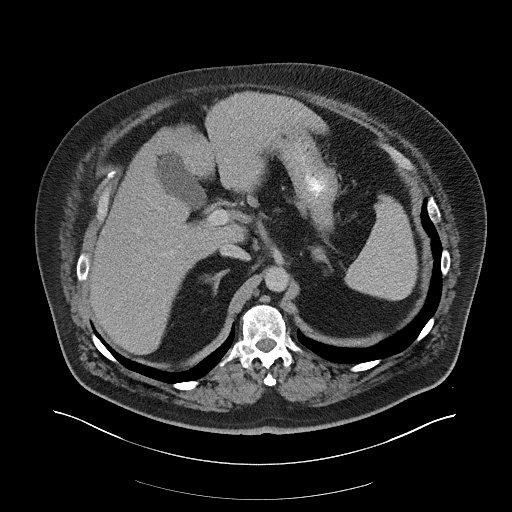
[im 78/100  bone]
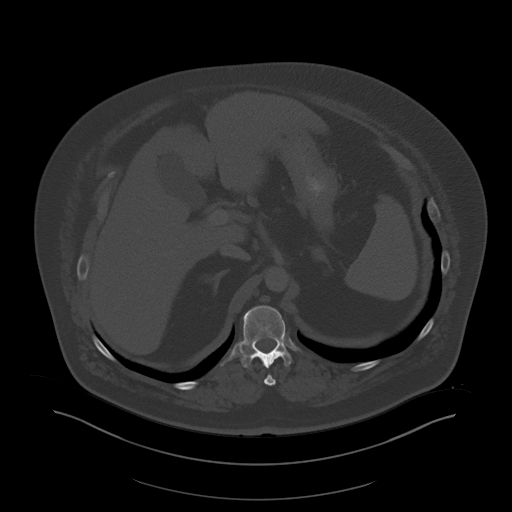
[im 85/100  soft-tissue]
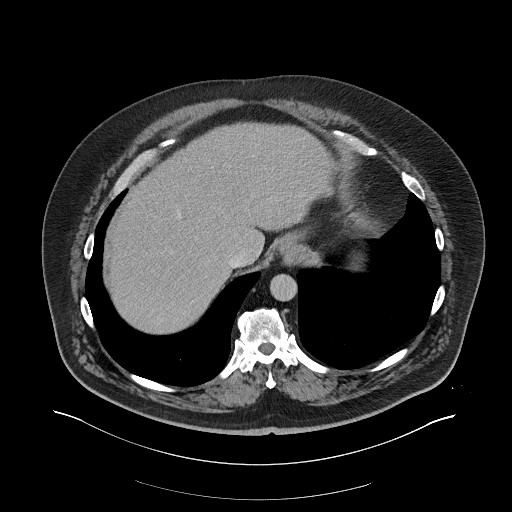
[im 92/100  soft-tissue]
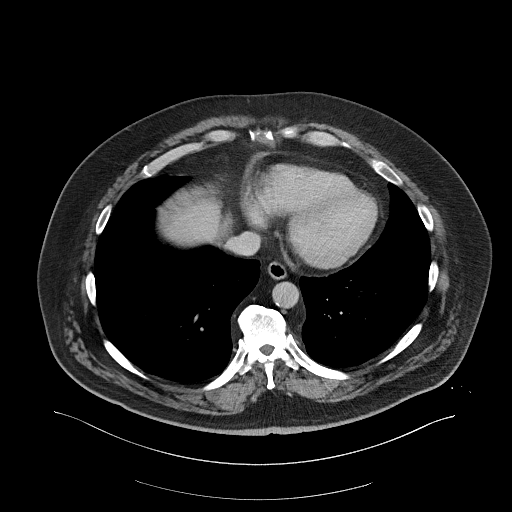

[Series 19: abd pelvis (person_name) · coronal · 0.95mm/px · 3 of 190 slices shown (2 of 2)]
[im 64/190  soft-tissue]
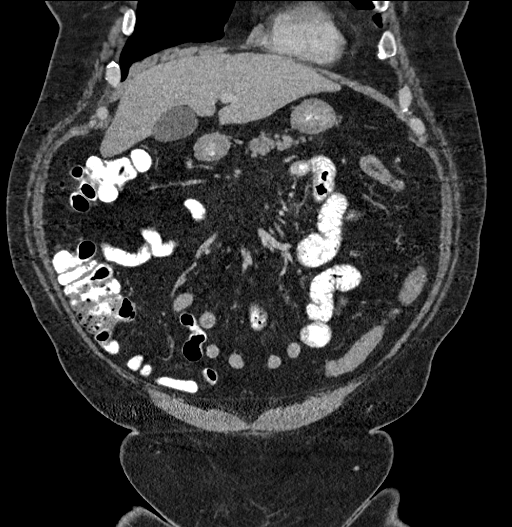
[im 85/190  soft-tissue]
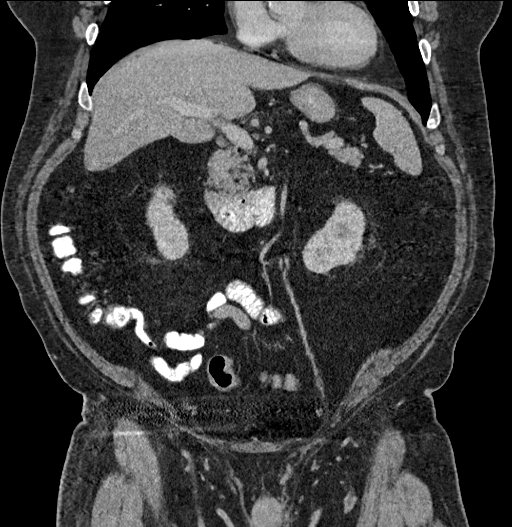
[im 106/190  soft-tissue]
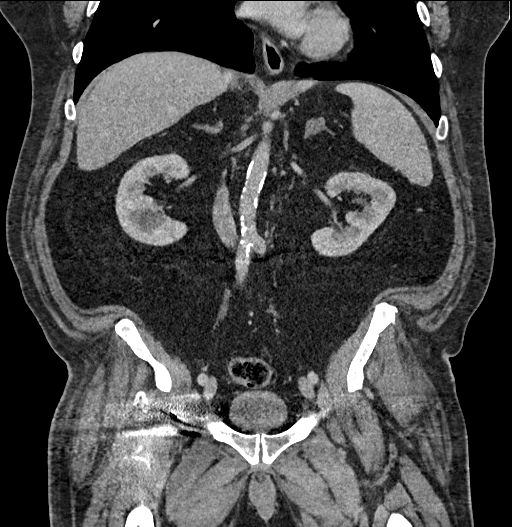

[14 of 46 positions shown; findings below may reference images not displayed]

FINDINGS: The patient had extravasation of intravenous contrast into the right
arm during power injection. The technologist ([email or web address]) was in the
room during the injection and the extravasation was not noted until
injection was nearly complete. I examined the patient immediately
after the injection and there was a small superficial area of
swelling overlying the right biceps muscle. The patient was
experiencing no pain and really had no tenderness in this region.
The patient had a good strong radial pulse at the wrist and normal
grip strength. He reported no paresthesias. Assessment of capillary
refill at the nail beds was not possible due to condition of the
patient's fingernails. Patient's arm was placed beside his chest and
CT imaging through the extravasation was performed. This confirmed a
small volume of contrast localized in the subcutaneous tissues
between the skin and muscle compartments of the arm. I discussed the
event directly with the patient and counseled on post extravasation
treatment. I emphasized keeping the right arm elevated, using ice
(not applied directly to skinn) on the area in 15 minutes intervals,
3 to 4 times a day. Tylenol or ibuprofen could be used for comfort
as needed. I explained to the patient that the swelling should
gradually decrease over the next 24 hours and there should be no
development of or worsening pain. I also explained that if he starts
to experience pain, increased swelling, or skin changes (including
redness, seeping, or blisters) he should seek immediate attention at
an emergency room. The patient was provided written contrast
extravasation care instructions with a contact number in radiology.
He was discharged home in good condition.

Lower chest: Nearly the entire chest was included in the imaging of
the extravasation. This reveals normal heart size without
substantial pericardial effusion. Coronary artery calcification is
evident. Atherosclerotic calcification is noted in the wall of the
thoracic aorta. No mediastinal lymphadenopathy. No evidence for
gross hilar lymphadenopathy although assessment is limited by the
lack of intravenous contrast on today's study. The esophagus has
normal imaging features. There is no axillary lymphadenopathy. The
central tracheobronchial airways are patent. Tiny calcified
granuloma noted right upper lobe. No suspicious pulmonary nodule or
mass no worrisome lytic or sclerotic osseous abnormality.

Hepatobiliary: No focal abnormality within the liver parenchyma.
Subtle nodularity of liver contour evident. There is no evidence for
gallstones, gallbladder wall thickening, or pericholecystic fluid.
No intrahepatic or extrahepatic biliary dilation.

Pancreas: No focal mass lesion. No dilatation of the main duct. No
intraparenchymal cyst. No peripancreatic edema.

Spleen: No splenomegaly. No focal mass lesion.

Adrenals/Urinary Tract: Right adrenal gland unremarkable. 19 mm left
adrenal nodule has washout characteristics compatible with benign
adrenal adenoma 2.3 cm water density lesion in the lower pole of the
right kidney is compatible with a cyst. Left kidney unremarkable. No
evidence for hydroureter. The urinary bladder appears normal for the
degree of distention.

Stomach/Bowel: Stomach is nondistended. No gastric wall thickening.
No evidence of outlet obstruction. Duodenum is normally positioned
as is the ligament of Treitz. No small bowel wall thickening. No
small bowel dilatation. The terminal ileum is normal. The appendix
is normal. No gross colonic mass. No colonic wall thickening.

Vascular/Lymphatic: There is abdominal aortic atherosclerosis
without aneurysm. There is no gastrohepatic or hepatoduodenal
ligament lymphadenopathy. No intraperitoneal or retroperitoneal
lymphadenopathy. No pelvic sidewall lymphadenopathy.

Reproductive: Prostate gland unremarkable.

Other: No intraperitoneal free fluid.

Musculoskeletal: Small bilateral groin hernias contain only fat.
Patient is status post right hip replacement. Status post lumbar
fusion. No worrisome lytic or sclerotic osseous abnormality.
IMPRESSION: 1. No acute findings in the abdomen or pelvis. No findings to
explain the patient's history of abdominal pain and bloating with
nausea vomiting.
2. Subtle nodularity of liver contour raises the question of but is
not diagnostic for cirrhosis.
3. Small bilateral groin hernias contain only fat.
4.  Aortic Atherosclerois ([7M]-170.0)

## 2018-03-17 MED ORDER — IOPAMIDOL (ISOVUE-300) INJECTION 61%
100.0000 mL | Freq: Once | INTRAVENOUS | Status: AC | PRN
  Administered 2018-03-17: 100 mL via INTRAVENOUS

## 2018-03-17 NOTE — Discharge Instructions (Addendum)
Mario [name]    [id number]   21-Mar-1950 CT Abd/Pelvis with contrast  03/17/2018  During your examination at [facility] some of the x-ray dye leaked into the tissues around the vein that the x-ray dye was given through.  What should you do?  1. Apply ice 20 minutes four times a day for three days.  2. Keep the affected extremity elevated above the rest of your body for 48 hours.  3. If you develop any of the following signs or symptoms, please contact Radiology at [phone].  Increased pain  Increasing swelling   Change in sensation (ex. Numbness, tingling)  Development of redness or increase in warmth around the affected area  Increasing hardness  Blistering   I have read and understand these instructions.  Any questions I raised have been discussed to my satisfaction.  I understand that failure to follow these instructions may result in additional complications.

## 2018-04-12 ENCOUNTER — Encounter: Payer: Self-pay | Admitting: *Deleted

## 2018-04-13 ENCOUNTER — Ambulatory Visit: Payer: Medicare Other | Admitting: Anesthesiology

## 2018-04-13 ENCOUNTER — Encounter
Admission: RE | Disposition: A | Discharge status: Home / Self Care | Payer: Self-pay | Source: Home / Self Care | Attending: Internal Medicine

## 2018-04-13 ENCOUNTER — Ambulatory Visit
Admission: RE | Admit: 2018-04-13 | Discharge: 2018-04-13 | Disposition: A | Discharge status: Home / Self Care | Payer: Medicare Other | Attending: Internal Medicine | Admitting: Internal Medicine

## 2018-04-13 ENCOUNTER — Encounter: Payer: Self-pay | Admitting: Anesthesiology

## 2018-04-13 DIAGNOSIS — E119 Type 2 diabetes mellitus without complications: Secondary | ICD-10-CM | POA: Diagnosis not present

## 2018-04-13 DIAGNOSIS — Z79899 Other long term (current) drug therapy: Secondary | ICD-10-CM | POA: Insufficient documentation

## 2018-04-13 DIAGNOSIS — K228 Other specified diseases of esophagus: Secondary | ICD-10-CM | POA: Diagnosis not present

## 2018-04-13 DIAGNOSIS — K219 Gastro-esophageal reflux disease without esophagitis: Secondary | ICD-10-CM | POA: Diagnosis not present

## 2018-04-13 DIAGNOSIS — R6881 Early satiety: Secondary | ICD-10-CM | POA: Diagnosis not present

## 2018-04-13 DIAGNOSIS — K2970 Gastritis, unspecified, without bleeding: Secondary | ICD-10-CM | POA: Insufficient documentation

## 2018-04-13 DIAGNOSIS — Z7984 Long term (current) use of oral hypoglycemic drugs: Secondary | ICD-10-CM | POA: Insufficient documentation

## 2018-04-13 DIAGNOSIS — K449 Diaphragmatic hernia without obstruction or gangrene: Secondary | ICD-10-CM | POA: Insufficient documentation

## 2018-04-13 DIAGNOSIS — I700 Atherosclerosis of aorta: Secondary | ICD-10-CM | POA: Insufficient documentation

## 2018-04-13 DIAGNOSIS — Z7951 Long term (current) use of inhaled steroids: Secondary | ICD-10-CM | POA: Insufficient documentation

## 2018-04-13 DIAGNOSIS — K5904 Chronic idiopathic constipation: Secondary | ICD-10-CM | POA: Insufficient documentation

## 2018-04-13 DIAGNOSIS — K5730 Diverticulosis of large intestine without perforation or abscess without bleeding: Secondary | ICD-10-CM | POA: Insufficient documentation

## 2018-04-13 DIAGNOSIS — F329 Major depressive disorder, single episode, unspecified: Secondary | ICD-10-CM | POA: Diagnosis not present

## 2018-04-13 DIAGNOSIS — R110 Nausea: Secondary | ICD-10-CM | POA: Insufficient documentation

## 2018-04-13 DIAGNOSIS — K640 First degree hemorrhoids: Secondary | ICD-10-CM | POA: Insufficient documentation

## 2018-04-13 DIAGNOSIS — Z800 Family history of malignant neoplasm of digestive organs: Secondary | ICD-10-CM | POA: Diagnosis not present

## 2018-04-13 DIAGNOSIS — R1032 Left lower quadrant pain: Secondary | ICD-10-CM | POA: Insufficient documentation

## 2018-04-13 DIAGNOSIS — Z86010 Personal history of colonic polyps: Secondary | ICD-10-CM | POA: Diagnosis not present

## 2018-04-13 HISTORY — DX: Depression, unspecified: F32.A

## 2018-04-13 HISTORY — PX: ESOPHAGOGASTRODUODENOSCOPY (EGD) WITH PROPOFOL: SHX5813

## 2018-04-13 HISTORY — DX: Personal history of urinary calculi: Z87.442

## 2018-04-13 HISTORY — PX: COLONOSCOPY WITH PROPOFOL: SHX5780

## 2018-04-13 HISTORY — DX: Major depressive disorder, single episode, unspecified: F32.9

## 2018-04-13 SURGERY — COLONOSCOPY WITH PROPOFOL
Anesthesia: General

## 2018-04-13 MED ORDER — SODIUM CHLORIDE 0.9 % IV SOLN
INTRAVENOUS | Status: DC
  Administered 2018-04-13: 10:00:00 via INTRAVENOUS

## 2018-04-13 MED ORDER — PROPOFOL 10 MG/ML IV BOLUS
INTRAVENOUS | Status: DC | PRN
  Administered 2018-04-13: 120 mg via INTRAVENOUS

## 2018-04-13 MED ORDER — LIDOCAINE 2% (20 MG/ML) 5 ML SYRINGE
INTRAMUSCULAR | Status: DC | PRN
  Administered 2018-04-13: 40 mg via INTRAVENOUS

## 2018-04-13 MED ORDER — FENTANYL CITRATE (PF) 100 MCG/2ML IJ SOLN
INTRAMUSCULAR | Status: DC | PRN
  Administered 2018-04-13 (×2): 50 ug via INTRAVENOUS

## 2018-04-13 MED ORDER — LIDOCAINE HCL (PF) 2 % IJ SOLN
INTRAMUSCULAR | Status: AC
  Filled 2018-04-13: qty 10

## 2018-04-13 MED ORDER — PROPOFOL 500 MG/50ML IV EMUL
INTRAVENOUS | Status: DC | PRN
  Administered 2018-04-13: 180 ug/kg/min via INTRAVENOUS

## 2018-04-13 MED ORDER — PROPOFOL 500 MG/50ML IV EMUL
INTRAVENOUS | Status: AC
  Filled 2018-04-13: qty 50

## 2018-04-13 MED ORDER — FENTANYL CITRATE (PF) 100 MCG/2ML IJ SOLN
INTRAMUSCULAR | Status: AC
  Filled 2018-04-13: qty 2

## 2018-04-13 NOTE — Interval H&P Note (Signed)
History and Physical Interval Note:  04/13/2018 10:09 AM  Mario [name]  has presented today for surgery, with the diagnosis of ABDOMINAL PAIN, CHRONIC CONSTIPATION, NAUSEA  The various methods of treatment have been discussed with the patient and family. After consideration of risks, benefits and other options for treatment, the patient has consented to  Procedure(s): COLONOSCOPY WITH PROPOFOL (N/A) ESOPHAGOGASTRODUODENOSCOPY (EGD) WITH PROPOFOL (N/A) as a surgical intervention .  The patient's history has been reviewed, patient examined, no change in status, stable for surgery.  I have reviewed the patient's chart and labs.  Questions were answered to the patient's satisfaction.     [place], [place]

## 2018-04-13 NOTE — H&P (Signed)
Outpatient short stay form Pre-procedure 04/13/2018 10:06 AM Mario [name] K. Mario [name], M.D.  Primary Physician: Harrel Lemon, M.D.  Reason for visit: LLQ Abdominal pain, nausea, constipation.  History of present illness:  68 y/o male presents for family hx of colon cancer in his brother, change in bowel habits, nausea, early satiety and personal history of colon polyps.  Has chronic constipation previously treated with Linzess which he stopped due to diarrhea and worsened nausea.  CT of the abdomen showed subtle nodularity of the liver, fat containing groin hernias, and aortic atherosclerosis but was otherwise normal.    Current Facility-Administered Medications:  .  0.9 %  sodium chloride infusion, , Intravenous, Continuous, [place], Benay Pike, MD, Last Rate: 20 mL/hr at 04/13/18 2694  Medications Prior to Admission  Medication Sig Dispense Refill Last Dose  . Cyanocobalamin (HM SUPER VITAMIN B12) 2500 MCG CHEW Chew 2,500 mcg by mouth daily.   Past Month at Unknown time  . glimepiride (AMARYL) 4 MG tablet Take 4 mg by mouth at bedtime.   04/11/2018 at Unknown time  . methocarbamol (ROBAXIN) 500 MG tablet Take 1 tablet (500 mg total) by mouth every 6 (six) hours as needed for muscle spasms. 40 tablet 0 Past Month at Unknown time  . oxyCODONE (OXY IR/ROXICODONE) 5 MG immediate release tablet Take 1-2 tablets (5-10 mg total) by mouth every 4 (four) hours as needed for moderate pain (pain score 4-6). 40 tablet 0 Past Month at Unknown time  . acetaminophen (TYLENOL) 500 MG tablet Take 500 mg by mouth every 6 (six) hours as needed.   04/05/2018  . CALCIUM-MAGNESIUM-ZINC PO Take 1 tablet by mouth daily.   04/05/2018  . Cholecalciferol (VITAMIN D) 10 MCG/ML LIQD Take by mouth.   04/06/2018  . Cholecalciferol (VITAMIN D3 PO) Take 3,000 mg by mouth 2 (two) times a week.   04/06/2018  . fluticasone (FLONASE) 50 MCG/ACT nasal spray Place 2 sprays into both nostrils 3 (three) times a week.   04/11/2018  . Magnesium 400  MG CAPS Take 1 capsule by mouth daily.   04/06/2018  . metFORMIN (GLUCOPHAGE) 1000 MG tablet Take 1,000 mg by mouth 2 (two) times daily with a meal.   04/11/2018  . Multiple Vitamins-Minerals (ZINC PO) Take 1 capsule by mouth daily.   04/06/2018  . ondansetron (ZOFRAN) 4 MG tablet Take 4 mg by mouth every 8 (eight) hours as needed for nausea or vomiting.   Not Taking at Unknown time  . pantoprazole (PROTONIX) 40 MG tablet Take 40 mg by mouth 2 (two) times daily before a meal.   Not Taking at Unknown time  . pravastatin (PRAVACHOL) 40 MG tablet Take 40 mg by mouth daily.   04/11/2018  . Pseudoephedrine-Acetaminophen (SINUS RELIEF PO) Take 1 tablet by mouth at bedtime.   Not Taking at Unknown time  . traZODone (DESYREL) 100 MG tablet Take 100 mg by mouth at bedtime.   04/11/2018     Allergies  Allergen Reactions  . Levaquin [Levofloxacin] Itching    Red lines     Past Medical History:  Diagnosis Date  . Anginal pain ([place])   . Back pain    lower  . Claustrophobia   . Depression   . Diabetes mellitus without complication ([place])   . Dyspnea   . Elevated lipids   . Environmental and seasonal allergies   . GERD (gastroesophageal reflux disease)   . Glaucoma   . HH (hiatus hernia)   . Hip pain Right  .  History of kidney stones   . Knee pain    right  . Sleep apnea     Review of systems:  Otherwise negative.    Physical Exam  Gen: Alert, oriented. Appears stated age.  HEENT: /AT. PERRLA. Lungs: CTA, no wheezes. CV: RR nl S1, S2. Abd: soft, benign, no masses. BS+ Ext: No edema. Pulses 2+    Planned procedures: Proceed with EGD and colonoscopy. The patient understands the nature of the planned procedure, indications, risks, alternatives and potential complications including but not limited to bleeding, infection, perforation, damage to internal organs and possible oversedation/side effects from anesthesia. The patient agrees and gives consent to proceed.  Please refer to procedure  notes for findings, recommendations and patient disposition/instructions.     Mario [name] K. Mario [name], M.D. Gastroenterology 04/13/2018  10:06 AM

## 2018-04-13 NOTE — Anesthesia Preprocedure Evaluation (Signed)
Anesthesia Evaluation  Patient identified by MRN, date of birth, ID band Patient awake    Reviewed: Allergy & Precautions, NPO status , Patient's Chart, lab work & pertinent test results, reviewed documented beta blocker date and time   Airway Mallampati: III  TM Distance: >3 FB     Dental  (+) Chipped   Pulmonary shortness of breath, sleep apnea ,           Cardiovascular + angina      Neuro/Psych PSYCHIATRIC DISORDERS Anxiety Depression    GI/Hepatic hiatal hernia, GERD  ,  Endo/Other  diabetes, Type 2  Renal/GU      Musculoskeletal   Abdominal   Peds  Hematology   Anesthesia Other Findings   Reproductive/Obstetrics                             Anesthesia Physical Anesthesia Plan  ASA: III  Anesthesia Plan: General   Post-op Pain Management:    Induction: Intravenous  PONV Risk Score and Plan:   Airway Management Planned:   Additional Equipment:   Intra-op Plan:   Post-operative Plan:   Informed Consent: I have reviewed the patients History and Physical, chart, labs and discussed the procedure including the risks, benefits and alternatives for the proposed anesthesia with the patient or authorized representative who has indicated his/her understanding and acceptance.       Plan Discussed with: CRNA  Anesthesia Plan Comments:         Anesthesia Quick Evaluation

## 2018-04-13 NOTE — Op Note (Addendum)
 [facility] Gastroenterology Patient Name: Mario [name] Procedure Date: 04/13/2018 9:54 AM MRN: [id number] Account #: [id number] Date of Birth: June 25, 1950 Admit Type: Outpatient Age: 68 Room: [facility] ENDO ROOM 3 Gender: Male Note Status: Supervisor Override Instrument Name: Endoscope  [id number] Procedure:             Upper GI endoscopy Indications:           Gastro-esophageal reflux disease, Abdominal bloating,                         Nausea Providers:             Adianna Darwin K. Ji Fairburn MD, MD Medicines:             Propofol  per Anesthesia Complications:         No immediate complications. Procedure:             Pre-Anesthesia Assessment:                        - The risks and benefits of the procedure and the                         sedation options and risks were discussed with the                         patient. All questions were answered and informed                         consent was obtained.                        - Patient identification and proposed procedure were                         verified prior to the procedure by the nurse. The                         procedure was verified in the procedure room.                        - ASA Grade Assessment: III - A patient with severe                         systemic disease.                        - After reviewing the risks and benefits, the patient                         was deemed in satisfactory condition to undergo the                         procedure.                        After obtaining informed consent, the endoscope was                         passed under direct vision. Throughout the procedure,  the patient's blood pressure, pulse, and oxygen                         saturations were monitored continuously. The Endoscope                         was introduced through the mouth, and advanced to the                         third part of duodenum. The upper GI endoscopy was                          accomplished without difficulty. The patient tolerated                         the procedure well. Findings:      The Z-line was irregular. Mucosa was biopsied with a cold forceps for       histology. One specimen bottle was sent to pathology.      Localized minimal inflammation characterized by erythema was found in       the gastric antrum. Biopsies were taken with a cold forceps for       Helicobacter pylori testing.      The examined duodenum was normal.      The exam was otherwise without abnormality. Impression:            - Z-line irregular. Biopsied.                        - Gastritis. Biopsied.                        - Normal examined duodenum.                        - The examination was otherwise normal. Recommendation:        - Await pathology results.                        - Proceed with colonoscopy Procedure Code(s):     --- Professional ---                        [phone], Esophagogastroduodenoscopy, flexible,                         transoral; with biopsy, single or multiple Diagnosis Code(s):     --- Professional ---                        R11.0, Nausea                        R10.32, Left lower quadrant pain                        K29.70, Gastritis, unspecified, without bleeding                        K22.8, Other specified diseases of esophagus CPT copyright 2022 American [employer] Association. All rights reserved. The codes documented in this report are preliminary and upon coder review may  be revised  to meet current compliance requirements. Ladell MARLA Boss MD, MD 04/13/2018 10:26:09 AM This report has been signed electronically. Number of Addenda: 0 Note Initiated On: 04/13/2018 9:54 AM Estimated Blood Loss:  Estimated blood loss: none.      [facility]

## 2018-04-13 NOTE — Transfer of Care (Signed)
Immediate Anesthesia Transfer of Care Note  Patient: Mario [name]  Procedure(s) Performed: COLONOSCOPY WITH PROPOFOL (N/A ) ESOPHAGOGASTRODUODENOSCOPY (EGD) WITH PROPOFOL (N/A )  Patient Location: PACU and Endoscopy Unit  Anesthesia Type:General  Level of Consciousness: drowsy  Airway & Oxygen Therapy: Patient Spontanous Breathing and Patient connected to face mask oxygen  Post-op Assessment: Report given to RN and Post -op Vital signs reviewed and stable  Post vital signs: Reviewed and stable  Last Vitals:  Vitals Value Taken Time  BP    Temp    Pulse 76 04/13/2018 10:44 AM  Resp    SpO2 95 % 04/13/2018 10:44 AM  Vitals shown include unvalidated device data.  Last Pain:  Vitals:   04/13/18 0919  TempSrc: Tympanic  PainSc: 2          Complications: No apparent anesthesia complications

## 2018-04-13 NOTE — Anesthesia Post-op Follow-up Note (Signed)
Anesthesia QCDR form completed.        

## 2018-04-13 NOTE — Anesthesia Postprocedure Evaluation (Signed)
Anesthesia Post Note  Patient: Mario [name]  Procedure(s) Performed: COLONOSCOPY WITH PROPOFOL (N/A ) ESOPHAGOGASTRODUODENOSCOPY (EGD) WITH PROPOFOL (N/A )  Patient location during evaluation: Endoscopy Anesthesia Type: General Level of consciousness: awake and alert Pain management: pain level controlled Vital Signs Assessment: post-procedure vital signs reviewed and stable Respiratory status: spontaneous breathing, nonlabored ventilation, respiratory function stable and patient connected to nasal cannula oxygen Cardiovascular status: blood pressure returned to baseline and stable Postop Assessment: no apparent nausea or vomiting Anesthetic complications: no     Last Vitals:  Vitals:   04/13/18 1110 04/13/18 1114  BP:  (!) 154/83  Pulse: 65 69  Resp: 17 15  Temp:    SpO2: 94% 94%    Last Pain:  Vitals:   04/13/18 1100  TempSrc: Tympanic  PainSc:                  Aldena Worm S

## 2018-04-13 NOTE — Op Note (Addendum)
 [facility] Gastroenterology Patient Name: Mario [name] Procedure Date: 04/13/2018 9:54 AM MRN: [id number] Account #: [id number] Date of Birth: 09-Jul-1950 Admit Type: Outpatient Age: 69 Room: [facility] ENDO ROOM 3 Gender: Male Note Status: Supervisor Override Instrument Name: Arvis [id number] Procedure:             Colonoscopy Indications:           Generalized abdominal pain, Constipation, Bloating Providers:             Mita Vallo K. Kayna Suppa MD, MD Medicines:             Propofol  per Anesthesia Complications:         No immediate complications. Procedure:             Pre-Anesthesia Assessment:                        - The risks and benefits of the procedure and the                         sedation options and risks were discussed with the                         patient. All questions were answered and informed                         consent was obtained.                        - Patient identification and proposed procedure were                         verified prior to the procedure by the nurse. The                         procedure was verified in the procedure room.                        - ASA Grade Assessment: III - A patient with severe                         systemic disease.                        - After reviewing the risks and benefits, the patient                         was deemed in satisfactory condition to undergo the                         procedure.                        After obtaining informed consent, the colonoscope was                         passed under direct vision. Throughout the procedure,                         the patient's blood pressure, pulse, and oxygen  saturations were monitored continuously. The                         Colonoscope was introduced through the anus and                         advanced to the the cecum, identified by appendiceal                         orifice and ileocecal valve. The  colonoscopy was                         performed without difficulty. The patient tolerated                         the procedure well. The quality of the bowel                         preparation was excellent. The ileocecal valve,                         appendiceal orifice, and rectum were photographed. Findings:      The perianal and digital rectal examinations were normal. Pertinent       negatives include normal sphincter tone and no palpable rectal lesions.      A few small-mouthed diverticula were found in the sigmoid colon.      Non-bleeding internal hemorrhoids were found during retroflexion. The       hemorrhoids were Grade I (internal hemorrhoids that do not prolapse).      The exam was otherwise without abnormality. Impression:            - Diverticulosis in the sigmoid colon.                        - Non-bleeding internal hemorrhoids.                        - The examination was otherwise normal.                        - No specimens collected. Recommendation:        - Await pathology results from EGD, also performed                         today.                        - Patient has a contact number available for                         emergencies. The signs and symptoms of potential                         delayed complications were discussed with the patient.                         Return to normal activities tomorrow. Written                         discharge instructions were  provided to the patient.                        - Resume previous diet.                        - Continue present medications.                        - Return to physician assistant in 2 months.                        - Repeat colonoscopy in 5 years for screening purposes.                        - The findings and recommendations were discussed with                         the patient and their spouse. Procedure Code(s):     --- Professional ---                        [phone], Colonoscopy,  flexible; diagnostic, including                         collection of specimen(s) by brushing or washing, when                         performed (separate procedure) Diagnosis Code(s):     --- Professional ---                        K57.30, Diverticulosis of large intestine without                         perforation or abscess without bleeding                        K59.04, Chronic idiopathic constipation                        R19.4, Change in bowel habit                        R10.32, Left lower quadrant pain                        K64.0, First degree hemorrhoids CPT copyright 2022 American Medical Association. All rights reserved. The codes documented in this report are preliminary and upon coder review may  be revised to meet current compliance requirements. Ladell MARLA Boss MD, MD 04/13/2018 10:42:45 AM This report has been signed electronically. Number of Addenda: 0 Note Initiated On: 04/13/2018 9:54 AM Scope Withdrawal Time: 0 hours 6 minutes 16 seconds  Total Procedure Duration: 0 hours 10 minutes 50 seconds  Estimated Blood Loss:  Estimated blood loss: none.      [facility]

## 2018-04-14 ENCOUNTER — Other Ambulatory Visit: Payer: Self-pay | Admitting: Internal Medicine

## 2018-04-14 ENCOUNTER — Encounter: Payer: Self-pay | Admitting: Internal Medicine

## 2018-04-14 DIAGNOSIS — R42 Dizziness and giddiness: Secondary | ICD-10-CM

## 2018-04-14 LAB — SURGICAL PATHOLOGY

## 2018-04-19 ENCOUNTER — Ambulatory Visit
Admission: RE | Admit: 2018-04-19 | Discharge: 2018-04-19 | Disposition: A | Discharge status: Home / Self Care | Payer: Medicare Other | Source: Ambulatory Visit | Attending: Internal Medicine | Admitting: Internal Medicine

## 2018-04-19 DIAGNOSIS — R42 Dizziness and giddiness: Secondary | ICD-10-CM | POA: Diagnosis present

## 2018-04-19 IMAGING — CT CT HEAD W/O CM
3 of 4 series · 14 of 47 positions shown, 16 images · non-contrast
Comparison: None.

CLINICAL DATA: Worsening vertigo for 3 weeks.

EXAM:
CT HEAD WITHOUT CONTRAST
TECHNIQUE: Contiguous axial images were obtained from the base of the skull
through the vertex without intravenous contrast.

[Series 3: head · axial · 0.37mm/px · z∈[-551,-434]mm · 8 of 28 slices shown, 10 images (1 of 3)]
[im 2/28  brain]
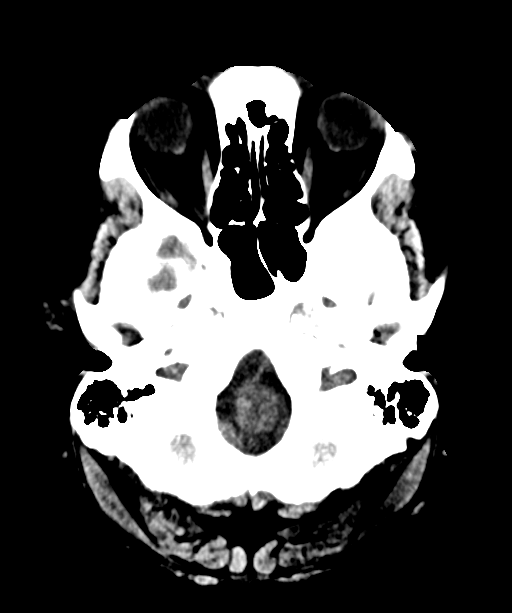
[im 2/28  bone]
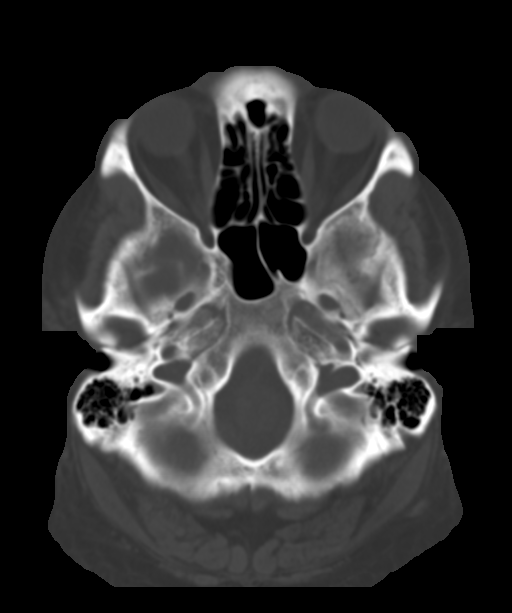
[im 6/28  brain]
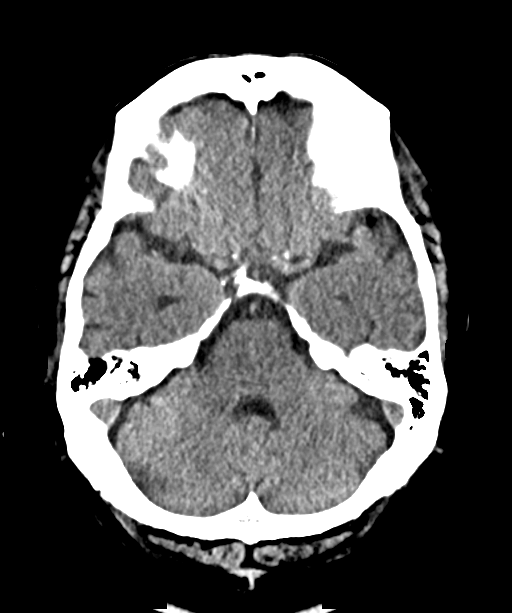
[im 10/28  brain]
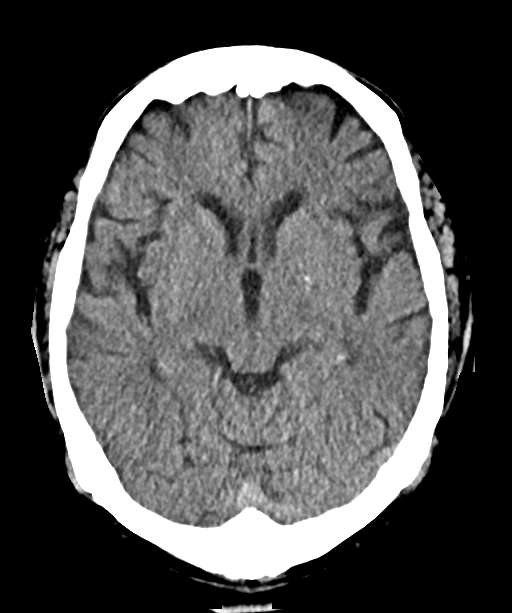
[im 12/28  brain]
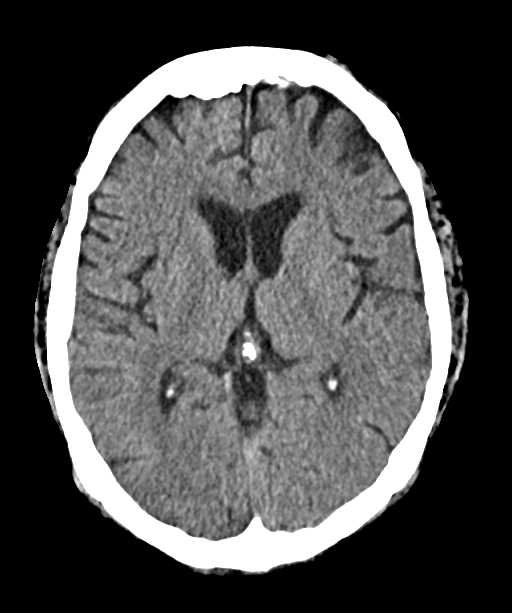
[im 16/28  brain]
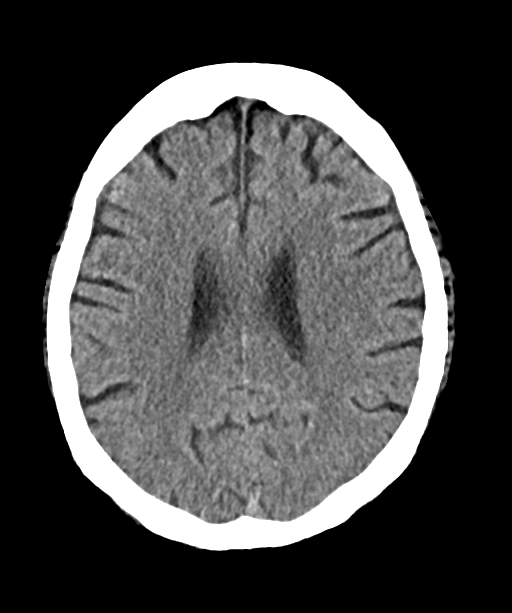
[im 16/28  bone]
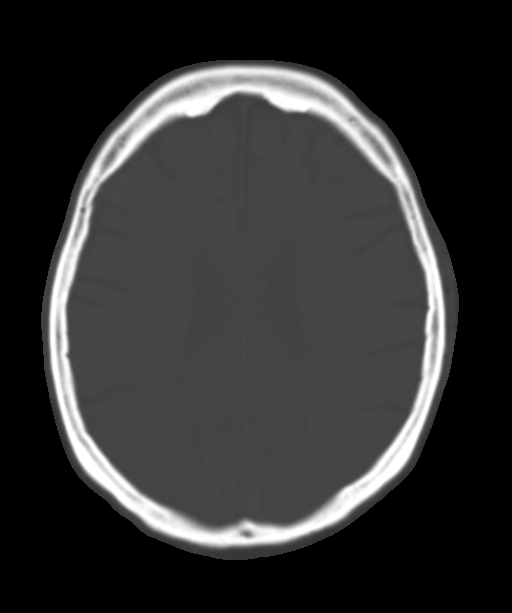
[im 18/28  brain]
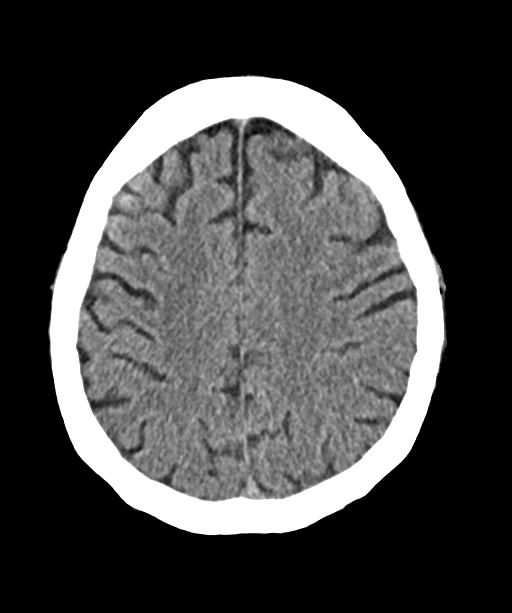
[im 22/28  brain]
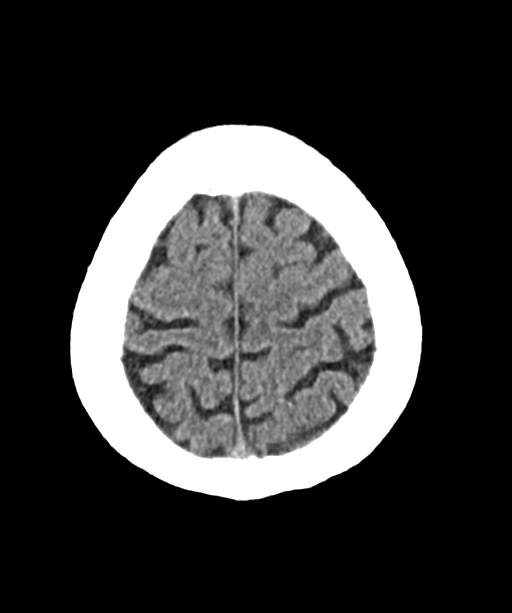
[im 26/28  brain]
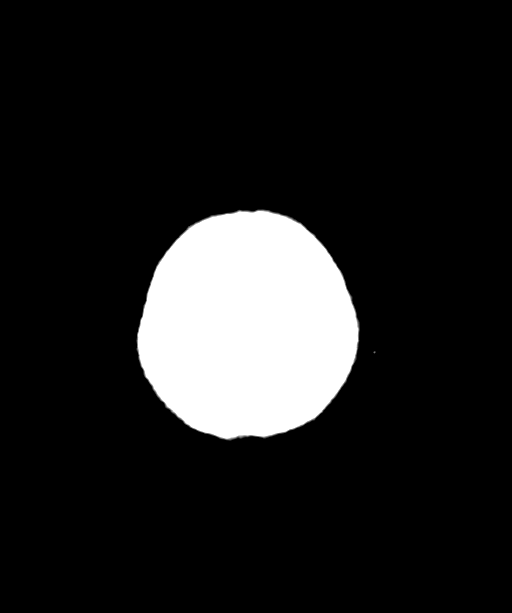

[Series 7: head · coronal · 0.28mm/px · 3 of 75 slices shown (2 of 3)]
[im 25/75  brain]
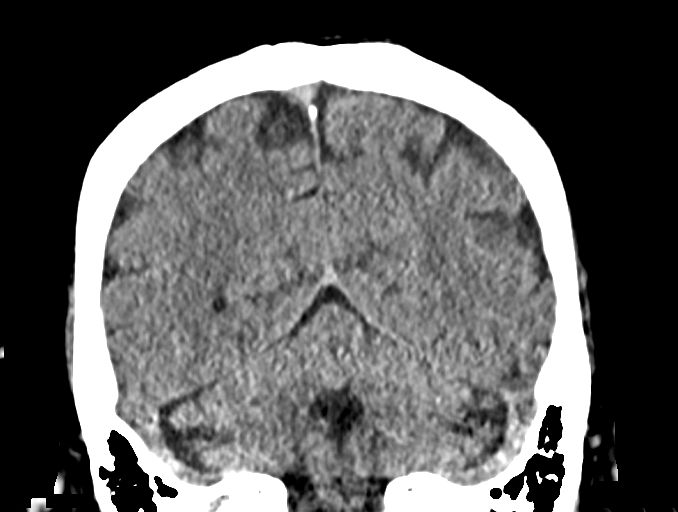
[im 33/75  brain]
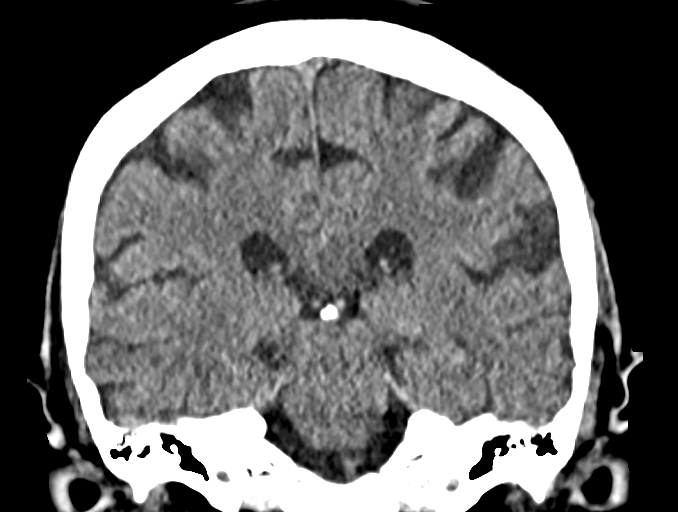
[im 42/75  brain]
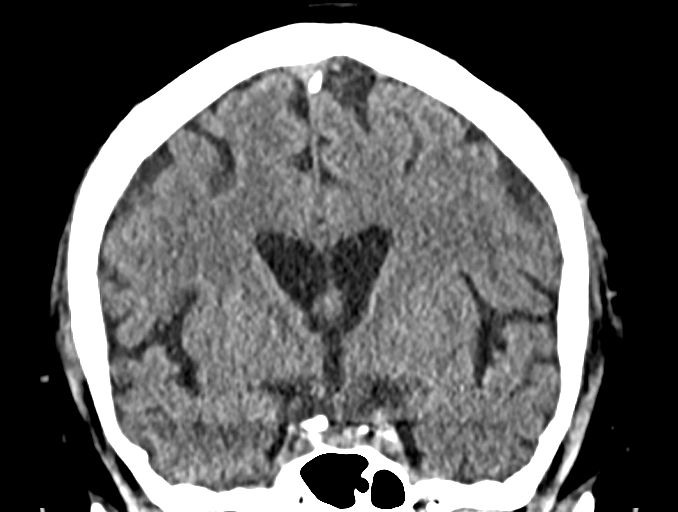

[Series 9: head · sagittal · 0.28mm/px · 3 of 63 slices shown (3 of 3)]
[im 21/63  brain]
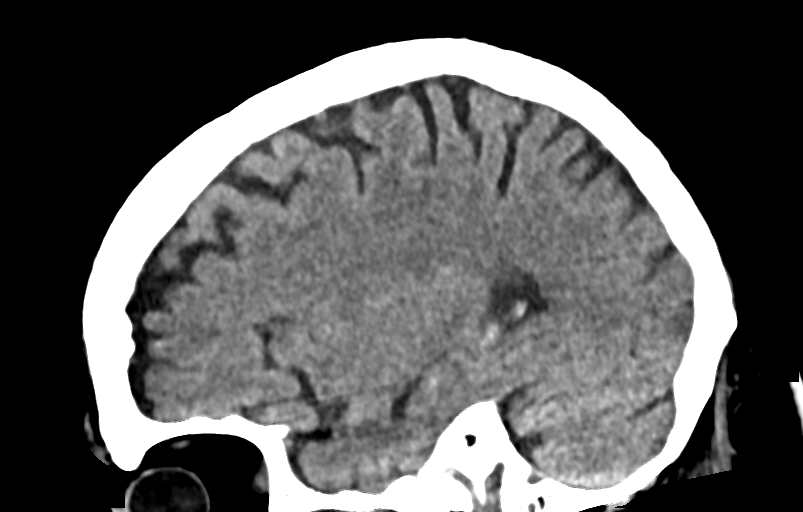
[im 32/63  brain]
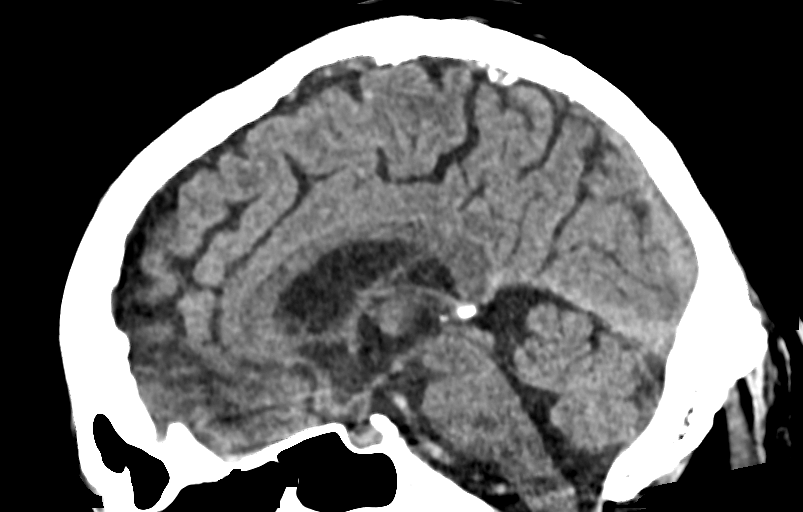
[im 42/63  brain]
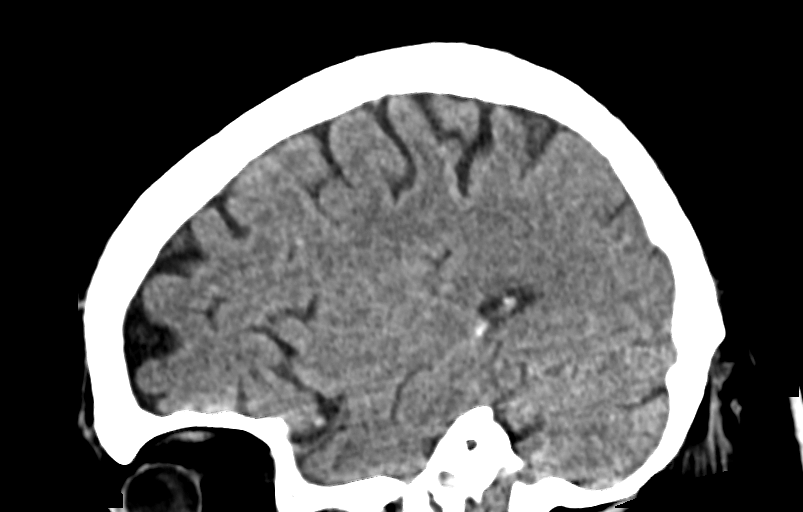

[14 of 47 positions shown; findings below may reference images not displayed]

FINDINGS: Brain: No evidence of acute infarction, hemorrhage, hydrocephalus,
extra-axial collection or mass lesion/mass effect.

Vascular: Calcific atherosclerotic disease of the intra cavernous
carotid arteries.

Skull: Normal. Negative for fracture or focal lesion.

Sinuses/Orbits: No acute finding.

Other: None.
IMPRESSION: No acute intracranial abnormality.

## 2018-08-03 ENCOUNTER — Other Ambulatory Visit: Payer: Self-pay | Admitting: Gastroenterology

## 2018-08-03 DIAGNOSIS — R634 Abnormal weight loss: Secondary | ICD-10-CM

## 2018-08-03 DIAGNOSIS — K7469 Other cirrhosis of liver: Secondary | ICD-10-CM

## 2018-09-17 ENCOUNTER — Other Ambulatory Visit: Payer: Self-pay | Admitting: Orthopedic Surgery

## 2018-09-17 DIAGNOSIS — M898X9 Other specified disorders of bone, unspecified site: Secondary | ICD-10-CM

## 2018-09-20 ENCOUNTER — Other Ambulatory Visit: Payer: Self-pay

## 2018-09-20 ENCOUNTER — Ambulatory Visit
Admission: RE | Admit: 2018-09-20 | Discharge: 2018-09-20 | Disposition: A | Discharge status: Home / Self Care | Payer: Medicare Other | Source: Ambulatory Visit | Attending: Gastroenterology | Admitting: Gastroenterology

## 2018-09-20 DIAGNOSIS — R634 Abnormal weight loss: Secondary | ICD-10-CM | POA: Insufficient documentation

## 2018-09-20 DIAGNOSIS — K7469 Other cirrhosis of liver: Secondary | ICD-10-CM

## 2018-09-20 IMAGING — US US ABDOMEN LIMITED W/ ELASTOGRAPHY
1 series · 13 of 25 positions shown · non-contrast
Comparison: CT abdomen from [DATE]

CLINICAL DATA: Cirrhosis and unintentional weight loss.

EXAM:
US ABDOMEN LIMITED - RIGHT UPPER QUADRANT
ULTRASOUND HEPATIC ELASTOGRAPHY
TECHNIQUE: Limited right upper quadrant abdominal ultrasound was performed. In
addition, ultrasound elastography evaluation of the liver was
performed. A region of interest was placed in the right lobe of the
liver. Following application of a compressive sonographic pulse,
shear waves were detected in the adjacent hepatic tissue and the
shear wave velocity was calculated. Multiple assessments were
performed at the selected site. Median shear wave velocity is
correlated to a Metavir fibrosis score.

[Series 1: us abdomen limited w/ elastography · 13 of 54 slices shown]
[im 1/54]
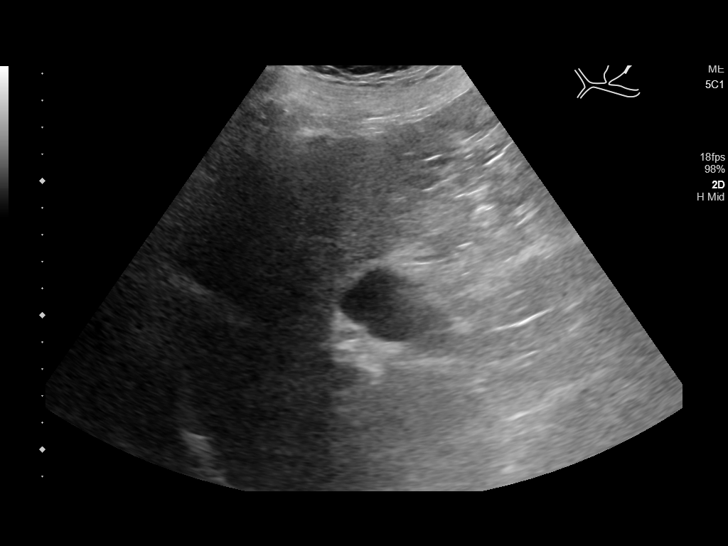
[im 5/54]
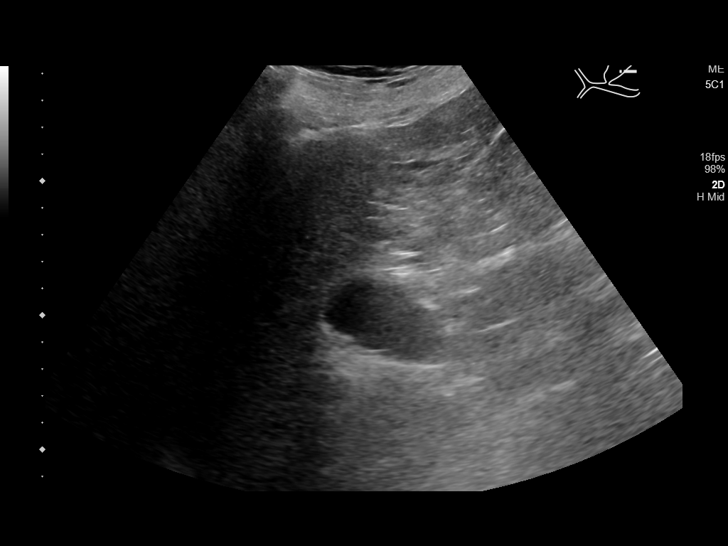
[im 9/54]
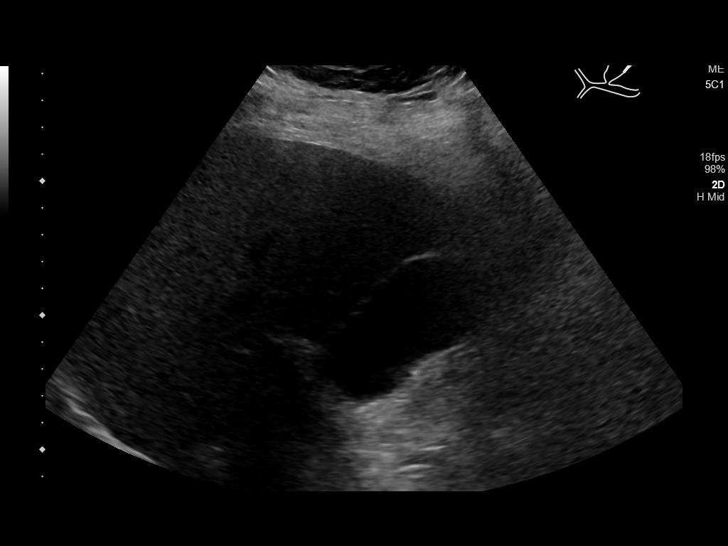
[im 14/54]
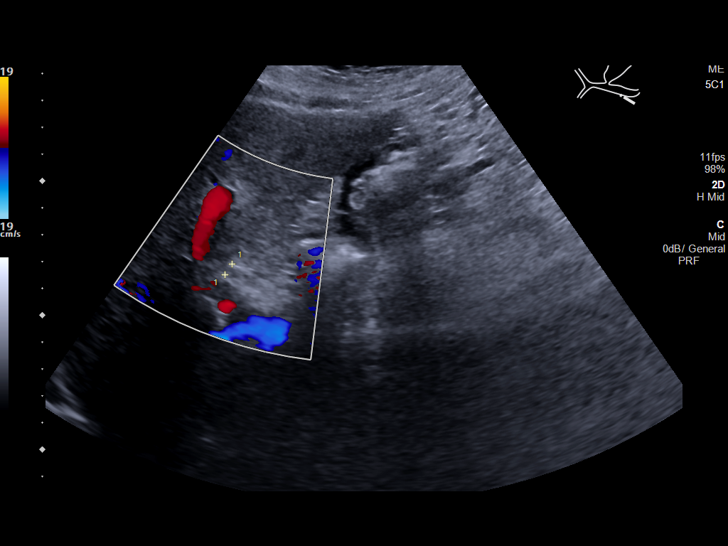
[im 18/54]
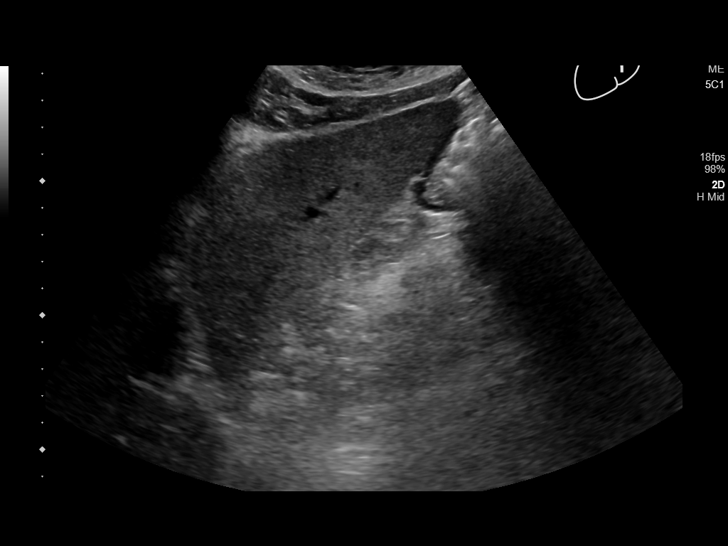
[im 23/54]
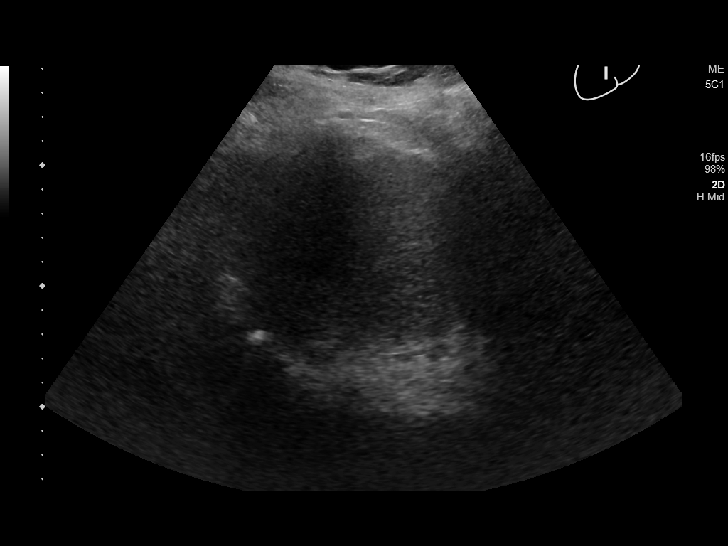
[im 27/54]
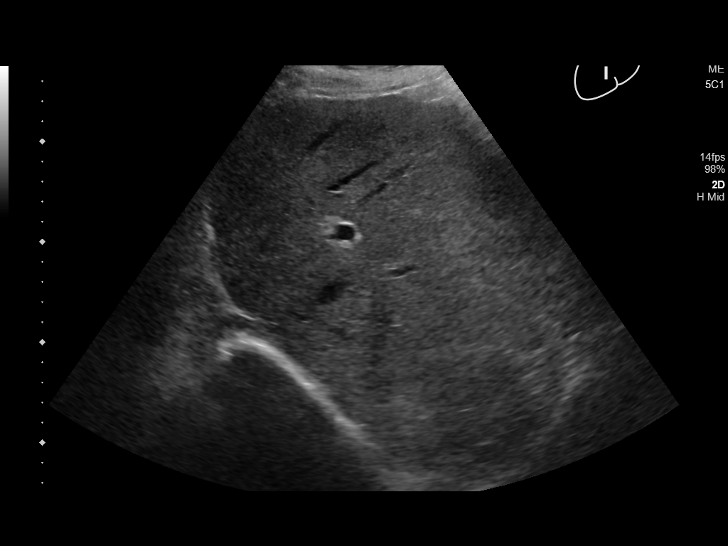
[im 31/54]
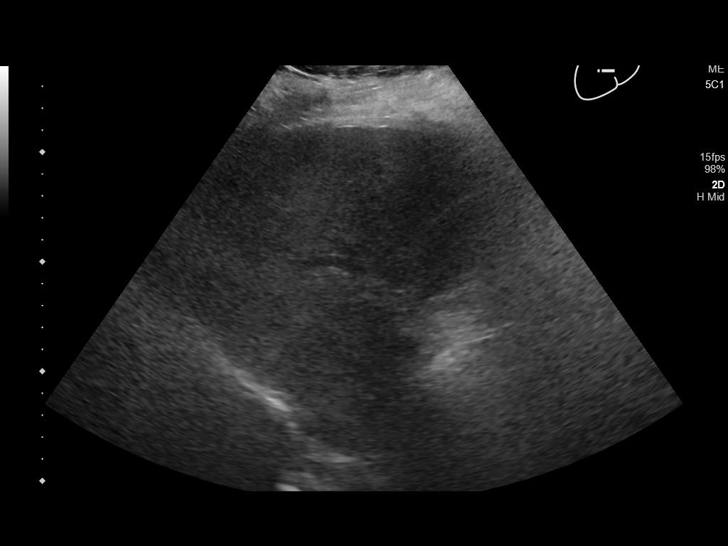
[im 36/54]
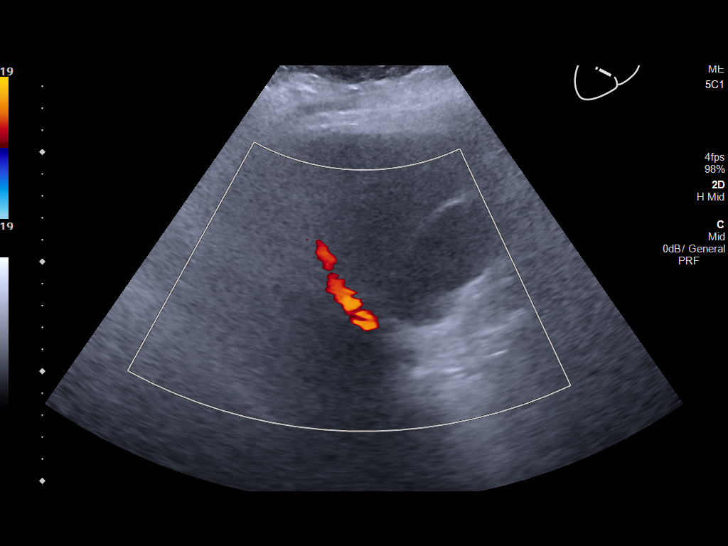
[im 40/54]
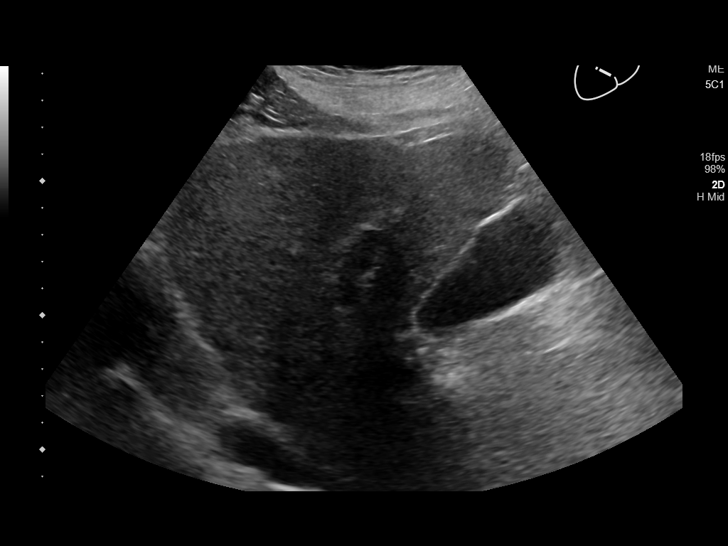
[im 45/54]
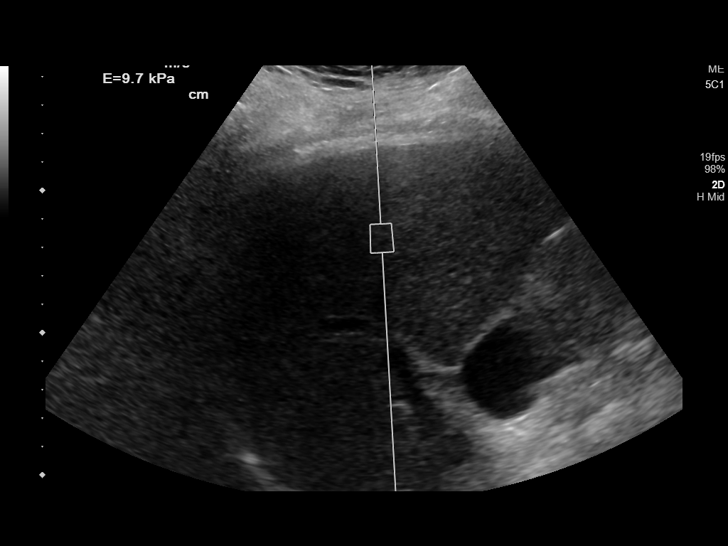
[im 49/54]
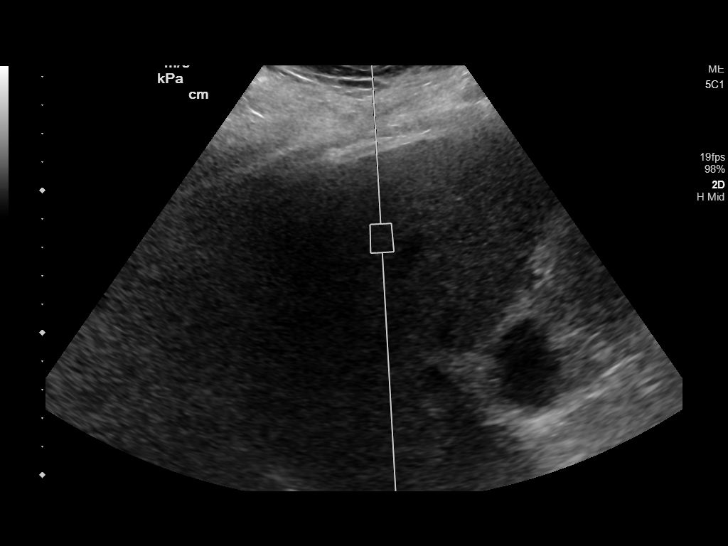
[im 54/54]
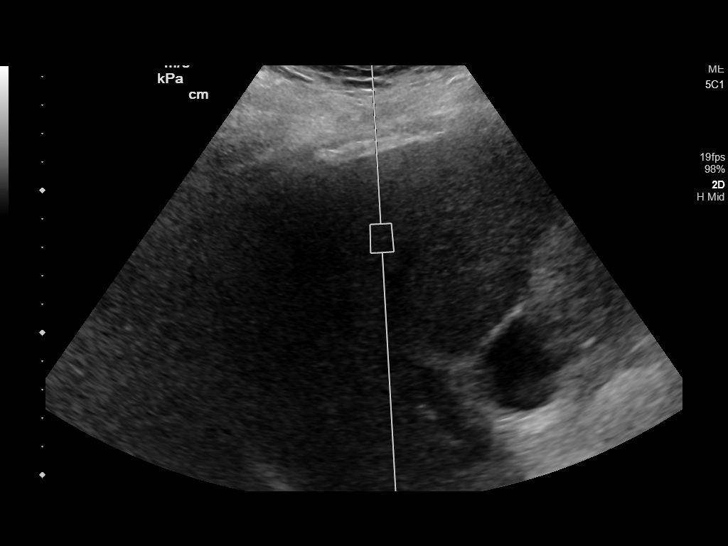

[13 of 25 positions shown; findings below may reference images not displayed]

FINDINGS: ULTRASOUND ABDOMEN LIMITED RIGHT UPPER QUADRANT

Gallbladder:

No gallstones or wall thickening visualized. No sonographic Murphy
sign noted.

Common bile duct:

Diameter: 5 mm

Liver:

No focal lesion identified. Accentuated parenchymal echogenicity.
Portal vein is patent on color Doppler imaging with normal direction
of blood flow towards the liver.

ULTRASOUND HEPATIC ELASTOGRAPHY

Device: Siemens Helix VTQ

Patient position: Supine

Transducer [id number]

Number of measurements: 10

Hepatic segment:  8

Median velocity:   1.9 m/sec

IQR:

IQR/Median velocity ratio:

Corresponding Metavir fibrosis score:  F2 + some F3

Risk of fibrosis: Moderate

Limitations of exam: None

Please note that abnormal shear wave velocities may also be
identified in clinical settings other than with hepatic fibrosis,
such as: acute hepatitis, elevated right heart and central venous
pressures including use of beta blockers, GAL disease
(GAL), infiltrative processes such as
mastocytosis/amyloidosis/infiltrative tumor, extrahepatic
cholestasis, in the post-prandial state, and liver transplantation.
Correlation with patient history, laboratory data, and clinical
condition recommended.
IMPRESSION: ULTRASOUND ABDOMEN:

1. Accentuated hepatic echogenicity is nonspecific but could be
related to cirrhosis or mild steatosis.

ULTRASOUND HEPATIC ELASTOGRAHY:

Median hepatic shear wave velocity is calculated at 1.9 m/sec.

Corresponding Metavir fibrosis score is F2 + some F3.

Risk of fibrosis is moderate.

Follow-up: Additional testing appropriate

## 2018-09-23 ENCOUNTER — Encounter
Admission: RE | Admit: 2018-09-23 | Discharge: 2018-09-23 | Disposition: A | Discharge status: Home / Self Care | Payer: Medicare Other | Source: Ambulatory Visit | Attending: Orthopedic Surgery | Admitting: Orthopedic Surgery

## 2018-09-23 ENCOUNTER — Other Ambulatory Visit: Payer: Self-pay

## 2018-09-23 DIAGNOSIS — M898X9 Other specified disorders of bone, unspecified site: Secondary | ICD-10-CM | POA: Diagnosis present

## 2018-09-23 IMAGING — CT NUCLEAR MEDICINE BONE SPECT
1 series · 12 of 14 positions shown, 15 images · non-contrast
Comparison: CT scan [DATE] and radiographs [DATE]

CLINICAL DATA: Right hip pain. History of total right hip
arthroplasty [DATE]

EXAM:
NM BONE SCAN AND SPECT IMAGING
TECHNIQUE: After intravenous injection of radiopharmaceutical, delayed planar
images were obtained in multiple projections. Additionally, delayed
triplanar SPECT images were obtained through the area of interest.
RADIOPHARMACEUTICALS:  22.95 mCi [9A] MDP

[Series 4: 3d bone 1.25 b70s · axial · 0.98mm/px · z∈[+1064,+1384]mm · 12 of 541 slices shown, 15 images]
[im 42/541  soft-tissue]
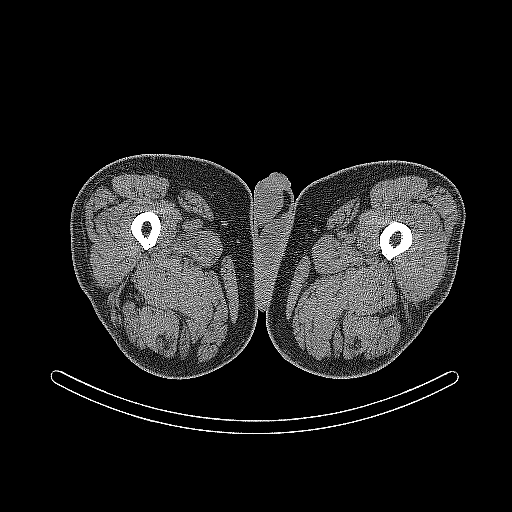
[im 42/541  bone]
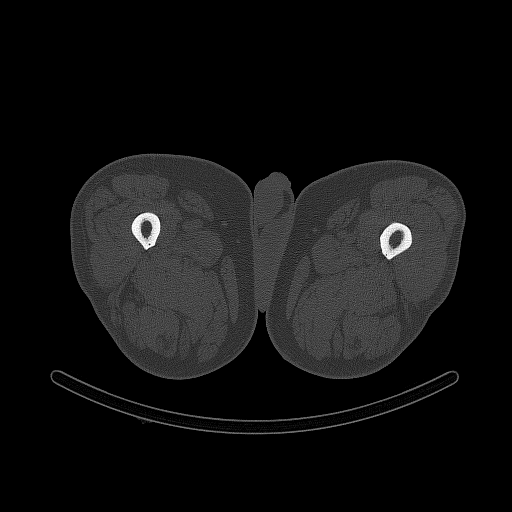
[im 84/541  bone]
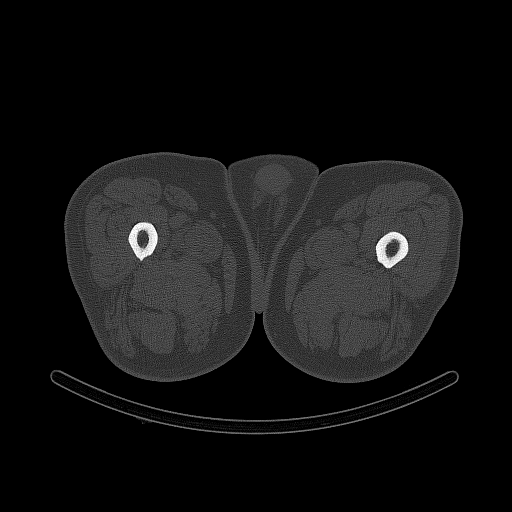
[im 125/541  bone]
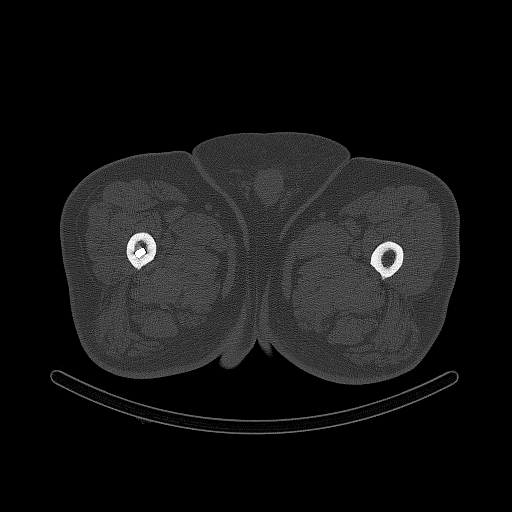
[im 167/541  bone]
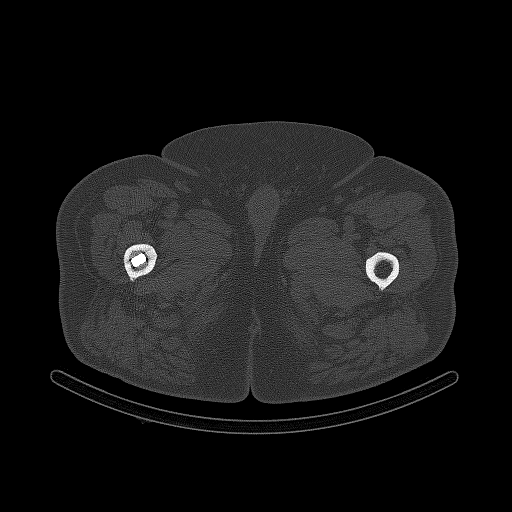
[im 208/541  soft-tissue]
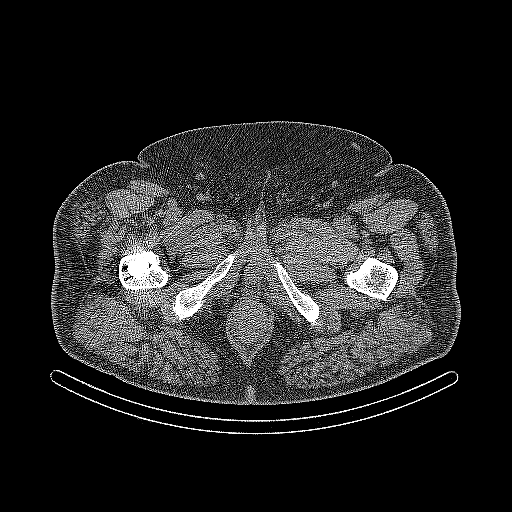
[im 208/541  bone]
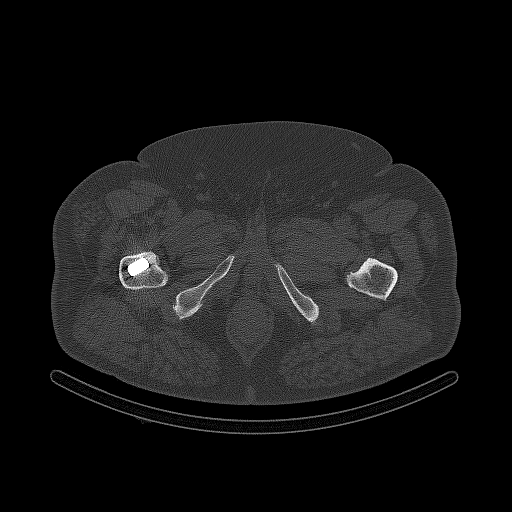
[im 250/541  bone]
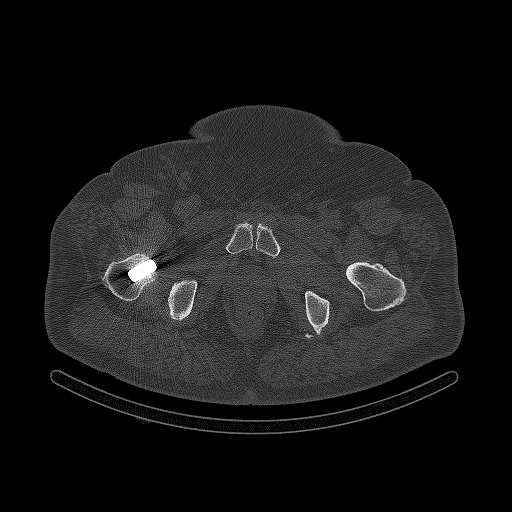
[im 291/541  bone]
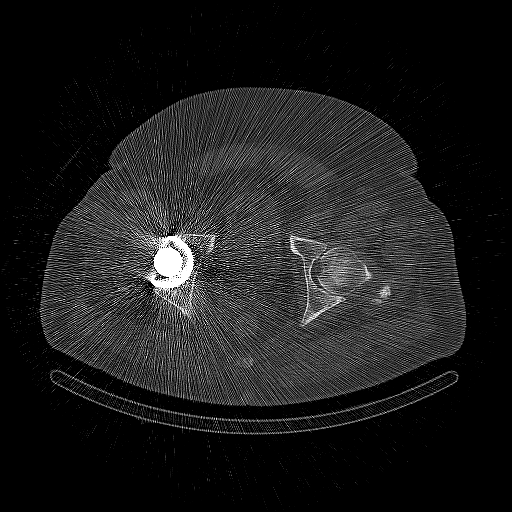
[im 333/541  bone]
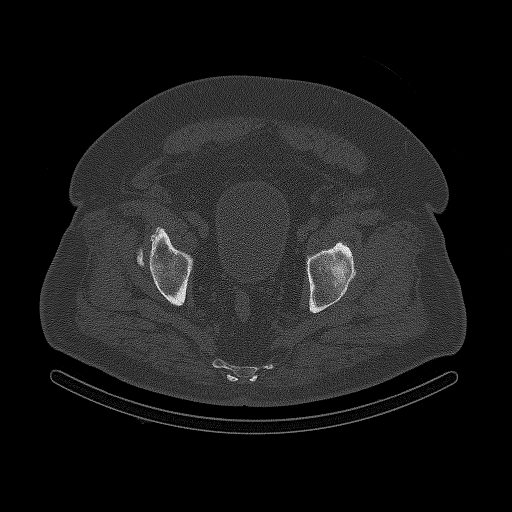
[im 374/541  soft-tissue]
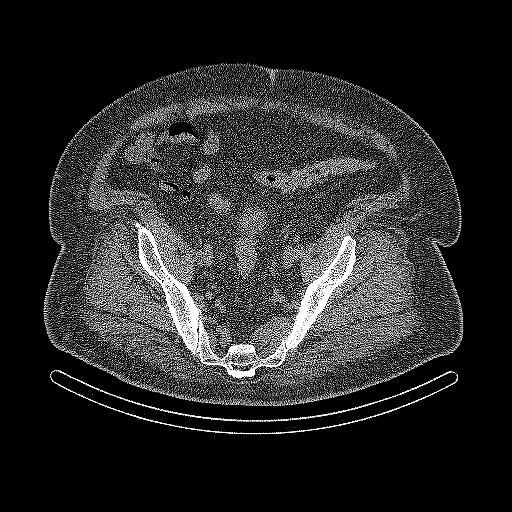
[im 374/541  bone]
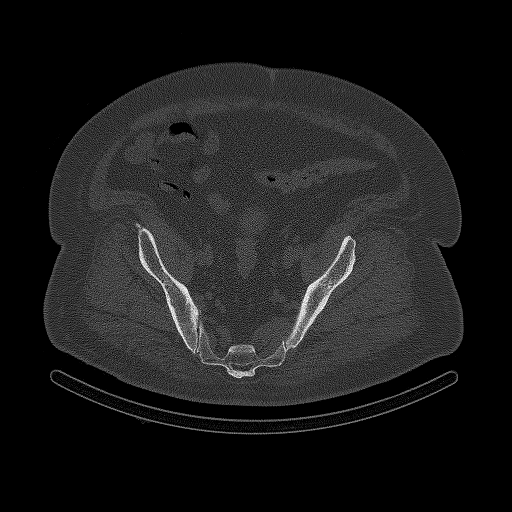
[im 416/541  bone]
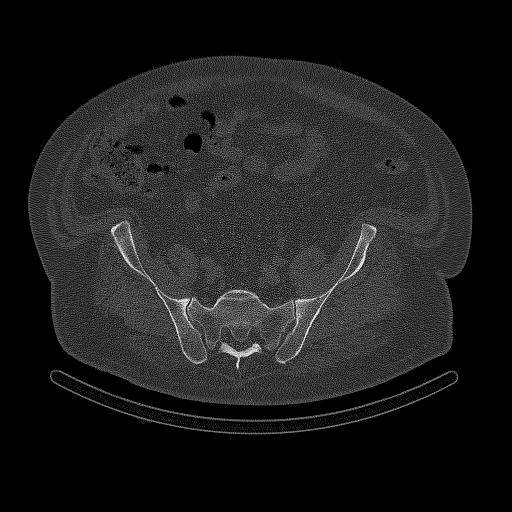
[im 457/541  bone]
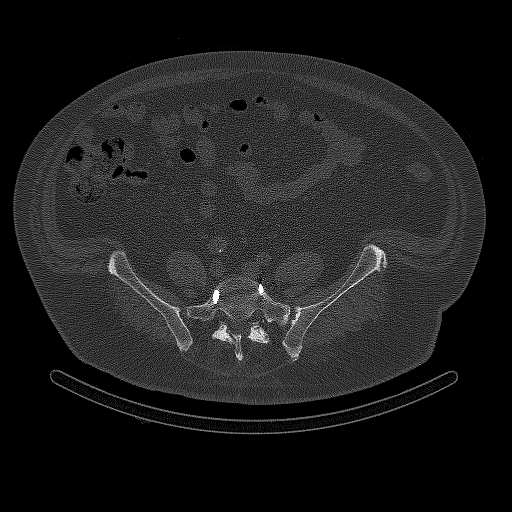
[im 499/541  bone]
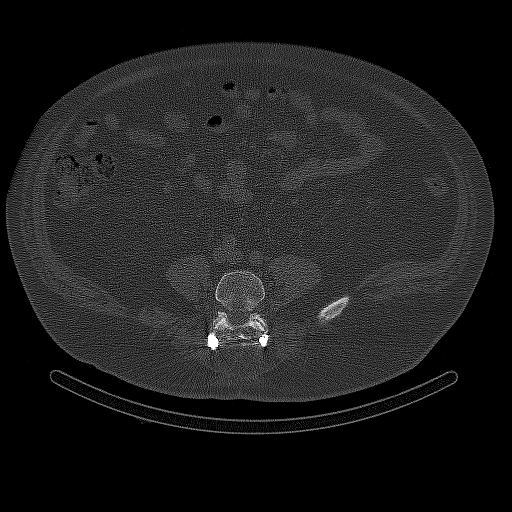

[12 of 14 positions shown; findings below may reference images not displayed]

FINDINGS: Blood flow appears symmetric to both lower extremities. The
immediate static images appear normal.

The total right hip arthroplasty components appear well seated. No
obvious periprosthetic loosening or fracture. Mild diffuse uptake
around the prosthesis is probably due to on going remodeling given
its recent placement. No focal areas of abnormal uptake are
identified.

Areas of heterotopic ossification are noted mainly in the region of
the gluteus minimus tendon and muscle. There is also a small bony
density near the lesser trochanter.

The pubic symphysis and SI joints are intact. No pelvic fractures or
bone lesions. The left hip is intact. No significant degenerative
changes, fracture or AVN.

No significant intrapelvic abnormalities are identified.
IMPRESSION: 1. Right total hip arthroplasty components appear well seated
without complicating features. No findings suspicious for loosening,
infection or periprosthetic fracture.
2. Intact bony pelvis.
3. Areas of heterotopic ossification or old avulsion fractures.

## 2018-09-23 MED ORDER — TECHNETIUM TC 99M MEDRONATE IV KIT
20.0000 | PACK | Freq: Once | INTRAVENOUS | Status: AC | PRN
  Administered 2018-09-23: 22.959 via INTRAVENOUS

## 2018-10-04 ENCOUNTER — Other Ambulatory Visit: Payer: Self-pay

## 2018-10-05 ENCOUNTER — Other Ambulatory Visit: Payer: Self-pay

## 2018-10-05 ENCOUNTER — Encounter: Payer: Self-pay | Admitting: Radiation Oncology

## 2018-10-05 ENCOUNTER — Ambulatory Visit
Admission: RE | Admit: 2018-10-05 | Discharge: 2018-10-05 | Disposition: A | Discharge status: Home / Self Care | Payer: Medicare Other | Source: Ambulatory Visit | Attending: Radiation Oncology | Admitting: Radiation Oncology

## 2018-10-05 VITALS — BP 138/72 | HR 70 | Temp 98.4°F | Resp 18 | Wt 279.8 lb

## 2018-10-05 DIAGNOSIS — F329 Major depressive disorder, single episode, unspecified: Secondary | ICD-10-CM | POA: Insufficient documentation

## 2018-10-05 DIAGNOSIS — G4730 Sleep apnea, unspecified: Secondary | ICD-10-CM | POA: Insufficient documentation

## 2018-10-05 DIAGNOSIS — Z79899 Other long term (current) drug therapy: Secondary | ICD-10-CM | POA: Insufficient documentation

## 2018-10-05 DIAGNOSIS — M61551 Other ossification of muscle, right thigh: Secondary | ICD-10-CM | POA: Diagnosis present

## 2018-10-05 DIAGNOSIS — K219 Gastro-esophageal reflux disease without esophagitis: Secondary | ICD-10-CM | POA: Insufficient documentation

## 2018-10-05 DIAGNOSIS — E119 Type 2 diabetes mellitus without complications: Secondary | ICD-10-CM | POA: Diagnosis not present

## 2018-10-05 DIAGNOSIS — R0602 Shortness of breath: Secondary | ICD-10-CM | POA: Diagnosis not present

## 2018-10-05 DIAGNOSIS — F40240 Claustrophobia: Secondary | ICD-10-CM | POA: Insufficient documentation

## 2018-10-05 DIAGNOSIS — M898X9 Other specified disorders of bone, unspecified site: Secondary | ICD-10-CM

## 2018-10-05 DIAGNOSIS — Z7984 Long term (current) use of oral hypoglycemic drugs: Secondary | ICD-10-CM | POA: Insufficient documentation
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
## 2018-10-25 ENCOUNTER — Other Ambulatory Visit: Payer: Self-pay

## 2018-10-25 ENCOUNTER — Ambulatory Visit
Admission: RE | Admit: 2018-10-25 | Discharge: 2018-10-25 | Disposition: A | Discharge status: Home / Self Care | Payer: Medicare Other | Source: Ambulatory Visit | Attending: Radiation Oncology | Admitting: Radiation Oncology

## 2018-10-25 DIAGNOSIS — M61551 Other ossification of muscle, right thigh: Secondary | ICD-10-CM | POA: Insufficient documentation

## 2018-10-25 DIAGNOSIS — Z510 Encounter for antineoplastic radiation therapy: Secondary | ICD-10-CM | POA: Insufficient documentation

## 2018-10-26 ENCOUNTER — Other Ambulatory Visit: Payer: Self-pay

## 2018-10-26 ENCOUNTER — Encounter
Admission: RE | Admit: 2018-10-26 | Discharge: 2018-10-26 | Disposition: A | Discharge status: Home / Self Care | Payer: Medicare Other | Source: Ambulatory Visit | Attending: Orthopedic Surgery | Admitting: Orthopedic Surgery

## 2018-10-26 DIAGNOSIS — Z01818 Encounter for other preprocedural examination: Secondary | ICD-10-CM | POA: Diagnosis present

## 2018-10-26 DIAGNOSIS — G4730 Sleep apnea, unspecified: Secondary | ICD-10-CM | POA: Diagnosis not present

## 2018-10-26 DIAGNOSIS — Z79899 Other long term (current) drug therapy: Secondary | ICD-10-CM | POA: Insufficient documentation

## 2018-10-26 DIAGNOSIS — M898X9 Other specified disorders of bone, unspecified site: Secondary | ICD-10-CM | POA: Insufficient documentation

## 2018-10-26 DIAGNOSIS — K219 Gastro-esophageal reflux disease without esophagitis: Secondary | ICD-10-CM | POA: Insufficient documentation

## 2018-10-26 DIAGNOSIS — Z7984 Long term (current) use of oral hypoglycemic drugs: Secondary | ICD-10-CM | POA: Diagnosis not present

## 2018-10-26 DIAGNOSIS — Z510 Encounter for antineoplastic radiation therapy: Secondary | ICD-10-CM | POA: Diagnosis not present

## 2018-10-26 DIAGNOSIS — E119 Type 2 diabetes mellitus without complications: Secondary | ICD-10-CM | POA: Insufficient documentation

## 2018-10-26 HISTORY — DX: Inflammatory liver disease, unspecified: K75.9

## 2018-10-26 HISTORY — DX: Panic disorder (episodic paroxysmal anxiety): F41.0

## 2018-10-26 HISTORY — DX: Unspecified osteoarthritis, unspecified site: M19.90

## 2018-10-26 HISTORY — DX: Fatty (change of) liver, not elsewhere classified: K76.0

## 2018-10-26 LAB — CBC
HCT: 44.6 % (ref 39.0–52.0)
Hemoglobin: 14.8 g/dL (ref 13.0–17.0)
MCH: 29.4 pg (ref 26.0–34.0)
MCHC: 33.2 g/dL (ref 30.0–36.0)
MCV: 88.5 fL (ref 80.0–100.0)
Platelets: 215 10*3/uL (ref 150–400)
RBC: 5.04 MIL/uL (ref 4.22–5.81)
RDW: 13.1 % (ref 11.5–15.5)
WBC: 6.6 10*3/uL (ref 4.0–10.5)
nRBC: 0 % (ref 0.0–0.2)

## 2018-10-26 LAB — BASIC METABOLIC PANEL
Anion gap: 8 (ref 5–15)
BUN: 13 mg/dL (ref 8–23)
CO2: 25 mmol/L (ref 22–32)
Calcium: 9.3 mg/dL (ref 8.9–10.3)
Chloride: 102 mmol/L (ref 98–111)
Creatinine, Ser: 0.74 mg/dL (ref 0.61–1.24)
GFR calc Af Amer: >60 mL/min (ref >60)
GFR calc non Af Amer: >60 mL/min (ref >60)
Glucose, Bld: 96 mg/dL (ref 70–99)
Potassium: 3.8 mmol/L (ref 3.5–5.1)
Sodium: 135 mmol/L (ref 135–145)

## 2018-10-26 LAB — PROTIME-INR
INR: 1.1 (ref 0.8–1.2)
Prothrombin Time: 13.7 seconds (ref 11.4–15.2)

## 2018-10-26 LAB — TYPE AND SCREEN
ABO/RH(D): A POS
Antibody Screen: NEGATIVE

## 2018-10-26 LAB — URINALYSIS, COMPLETE (UACMP) WITH MICROSCOPIC
Bacteria, UA: NONE SEEN
Bilirubin Urine: NEGATIVE
Glucose, UA: NEGATIVE mg/dL
Hgb urine dipstick: NEGATIVE
Ketones, ur: NEGATIVE mg/dL
Leukocytes,Ua: NEGATIVE
Nitrite: NEGATIVE
Protein, ur: NEGATIVE mg/dL
Specific Gravity, Urine: 1.018 (ref 1.005–1.030)
pH: 5 (ref 5.0–8.0)

## 2018-10-26 LAB — APTT: aPTT: 36 seconds (ref 24–36)

## 2018-10-26 LAB — SURGICAL PCR SCREEN
MRSA, PCR: NEGATIVE
Staphylococcus aureus: NEGATIVE

## 2018-10-26 LAB — SEDIMENTATION RATE: Sed Rate: 22 mm/hr — ABNORMAL HIGH (ref 0–20)

## 2018-10-26 NOTE — Patient Instructions (Addendum)
Your procedure is scheduled on: 11/02/2018 Tues Report to Same Day Surgery 2nd floor medical mall [facility] Entrance-take elevator on left to 2nd floor.  Check in with surgery information desk.) To find out your arrival time please call [phone] between 1PM - 3PM on 11/01/2018 Mon  Remember: Instructions that are not followed completely may result in serious medical risk, up to and including death, or upon the discretion of your surgeon and anesthesiologist your surgery may need to be rescheduled.    _x___ 1. Do not eat food after midnight the night before your procedure. You may drink clear liquids up to 2 hours before you are scheduled to arrive at the hospital for your procedure.  Do not drink clear liquids within 2 hours of your scheduled arrival to the hospital.  Clear liquids include  --Water or Apple juice without pulp  --Clear carbohydrate beverage such as ClearFast or Gatorade  --Black Coffee or Clear Tea (No milk, no creamers, do not add anything to                  the coffee or Tea Type 1 and type 2 diabetics should only drink water.   ____Ensure clear carbohydrate drink on the way to the hospital for bariatric patients  ____Ensure clear carbohydrate drink 3 hours before surgery.   No gum chewing or hard candies.     __x__ 2. No Alcohol for 24 hours before or after surgery.   __x__3. No Smoking or e-cigarettes for 24 prior to surgery.  Do not use any chewable tobacco products for at least 6 hour prior to surgery   ____  4. Bring all medications with you on the day of surgery if instructed.    __x__ 5. Notify your doctor if there is any change in your medical condition     (cold, fever, infections).    x___6. On the morning of surgery brush your teeth with toothpaste and water.  You may rinse your mouth with mouth wash if you wish.  Do not swallow any toothpaste or mouthwash.   Do not wear jewelry, make-up, hairpins, clips or nail polish.  Do not wear lotions,  powders, or perfumes. You may wear deodorant.  Do not shave 48 hours prior to surgery. Men may shave face and neck.  Do not bring valuables to the hospital.    [facility] is not responsible for any belongings or valuables.               Contacts, dentures or bridgework may not be worn into surgery.  Leave your suitcase in the car. After surgery it may be brought to your room.  For patients admitted to the hospital, discharge time is determined by your                       treatment team.  _  Patients discharged the day of surgery will not be allowed to drive home.  You will need someone to drive you home and stay with you the night of your procedure.    Please read over the following fact sheets that you were given:   [facility] Preparing for Surgery and or MRSA Information   _x___ Take anti-hypertensive listed below, cardiac, seizure, asthma,     anti-reflux and psychiatric medicines. These include:  1. None  2.  3.  4.  5.  6.  ____Fleets enema or Magnesium Citrate as directed.   _x___ Use CHG Soap or  sage wipes as directed on instruction sheet   ____ Use inhalers on the day of surgery and bring to hospital day of surgery  _x___ Stop Metformin and Janumet 2 days prior to surgery.    ____ Take 1/2 of usual insulin dose the night before surgery and none on the morning     surgery.   _x___ Follow recommendations from Cardiologist, Pulmonologist or PCP regarding          stopping Aspirin, Coumadin, Plavix ,Eliquis, Effient, or Pradaxa, and Pletal.  X____Stop Anti-inflammatories such as Advil, Aleve, Ibuprofen, Motrin, Naproxen, Naprosyn, Goodies powders or aspirin products. OK to take Tylenol and                          Celebrex.   _x___ Stop supplements until after surgery.  But may continue Vitamin D, Vitamin B,       and multivitamin.   _x___ Bring C-Pap to the hospital.

## 2018-10-28 LAB — URINE CULTURE: Culture: <10000 — AB

## 2018-10-29 ENCOUNTER — Inpatient Hospital Stay
Admission: RE | Admit: 2018-10-29 | Discharge: 2018-10-29 | Disposition: A | Discharge status: Home / Self Care | Payer: PRIVATE HEALTH INSURANCE | Source: Ambulatory Visit

## 2018-10-29 NOTE — Pre-Procedure Instructions (Signed)
Spoke with Patty at Dr. Rudene Christians office to notify them that Mario [name] did not show up for his covid testing today.  He also has something scheduled within the oncology department for the same day of surgery.  She will pass the info along.

## 2018-11-01 ENCOUNTER — Encounter: Payer: Self-pay | Admitting: Anesthesiology

## 2018-11-01 ENCOUNTER — Other Ambulatory Visit: Payer: Self-pay

## 2018-11-01 ENCOUNTER — Other Ambulatory Visit
Admission: RE | Admit: 2018-11-01 | Discharge: 2018-11-01 | Disposition: A | Discharge status: Home / Self Care | Payer: Medicare Other | Source: Ambulatory Visit | Attending: Orthopedic Surgery | Admitting: Orthopedic Surgery

## 2018-11-01 DIAGNOSIS — Z01812 Encounter for preprocedural laboratory examination: Secondary | ICD-10-CM | POA: Insufficient documentation

## 2018-11-01 DIAGNOSIS — Z20828 Contact with and (suspected) exposure to other viral communicable diseases: Secondary | ICD-10-CM | POA: Insufficient documentation

## 2018-11-01 LAB — SARS CORONAVIRUS 2 (TAT 6-24 HRS): SARS Coronavirus 2: NEGATIVE

## 2018-11-02 ENCOUNTER — Ambulatory Visit: Admission: RE | Admit: 2018-11-02 | Payer: Medicare Other | Source: Ambulatory Visit

## 2018-11-02 ENCOUNTER — Inpatient Hospital Stay
Admission: RE | Admit: 2018-11-02 | Discharge: 2018-11-05 | DRG: 467 | Disposition: A | Discharge status: Home Health | Payer: Medicare Other | Attending: Orthopedic Surgery | Admitting: Orthopedic Surgery

## 2018-11-02 ENCOUNTER — Inpatient Hospital Stay: Payer: Medicare Other

## 2018-11-02 ENCOUNTER — Inpatient Hospital Stay: Payer: Medicare Other | Admitting: Anesthesiology

## 2018-11-02 ENCOUNTER — Encounter
Admission: RE | Disposition: A | Discharge status: Home Health | Payer: Self-pay | Source: Home / Self Care | Attending: Orthopedic Surgery

## 2018-11-02 ENCOUNTER — Encounter: Payer: Self-pay | Admitting: *Deleted

## 2018-11-02 DIAGNOSIS — G4730 Sleep apnea, unspecified: Secondary | ICD-10-CM | POA: Diagnosis present

## 2018-11-02 DIAGNOSIS — T8489XA Other specified complication of internal orthopedic prosthetic devices, implants and grafts, initial encounter: Principal | ICD-10-CM | POA: Diagnosis present

## 2018-11-02 DIAGNOSIS — Z8249 Family history of ischemic heart disease and other diseases of the circulatory system: Secondary | ICD-10-CM

## 2018-11-02 DIAGNOSIS — Z6841 Body Mass Index (BMI) 40.0 and over, adult: Secondary | ICD-10-CM

## 2018-11-02 DIAGNOSIS — K759 Inflammatory liver disease, unspecified: Secondary | ICD-10-CM | POA: Diagnosis present

## 2018-11-02 DIAGNOSIS — Z8042 Family history of malignant neoplasm of prostate: Secondary | ICD-10-CM

## 2018-11-02 DIAGNOSIS — M619 Calcification and ossification of muscle, unspecified: Secondary | ICD-10-CM | POA: Diagnosis present

## 2018-11-02 DIAGNOSIS — M1611 Unilateral primary osteoarthritis, right hip: Secondary | ICD-10-CM | POA: Diagnosis present

## 2018-11-02 DIAGNOSIS — K219 Gastro-esophageal reflux disease without esophagitis: Secondary | ICD-10-CM | POA: Diagnosis present

## 2018-11-02 DIAGNOSIS — F329 Major depressive disorder, single episode, unspecified: Secondary | ICD-10-CM | POA: Diagnosis present

## 2018-11-02 DIAGNOSIS — Z96649 Presence of unspecified artificial hip joint: Secondary | ICD-10-CM

## 2018-11-02 DIAGNOSIS — K449 Diaphragmatic hernia without obstruction or gangrene: Secondary | ICD-10-CM | POA: Diagnosis present

## 2018-11-02 DIAGNOSIS — Z881 Allergy status to other antibiotic agents status: Secondary | ICD-10-CM | POA: Diagnosis not present

## 2018-11-02 DIAGNOSIS — G8918 Other acute postprocedural pain: Secondary | ICD-10-CM

## 2018-11-02 DIAGNOSIS — Z811 Family history of alcohol abuse and dependence: Secondary | ICD-10-CM | POA: Diagnosis not present

## 2018-11-02 DIAGNOSIS — Z803 Family history of malignant neoplasm of breast: Secondary | ICD-10-CM

## 2018-11-02 DIAGNOSIS — Z419 Encounter for procedure for purposes other than remedying health state, unspecified: Secondary | ICD-10-CM

## 2018-11-02 DIAGNOSIS — Z7984 Long term (current) use of oral hypoglycemic drugs: Secondary | ICD-10-CM

## 2018-11-02 DIAGNOSIS — H409 Unspecified glaucoma: Secondary | ICD-10-CM | POA: Diagnosis present

## 2018-11-02 DIAGNOSIS — E119 Type 2 diabetes mellitus without complications: Secondary | ICD-10-CM | POA: Diagnosis present

## 2018-11-02 DIAGNOSIS — Z833 Family history of diabetes mellitus: Secondary | ICD-10-CM | POA: Diagnosis not present

## 2018-11-02 DIAGNOSIS — K760 Fatty (change of) liver, not elsewhere classified: Secondary | ICD-10-CM | POA: Diagnosis present

## 2018-11-02 HISTORY — PX: TOTAL HIP REVISION: SHX763

## 2018-11-02 LAB — CBC
HCT: 41.6 % (ref 39.0–52.0)
Hemoglobin: 13.6 g/dL (ref 13.0–17.0)
MCH: 29.7 pg (ref 26.0–34.0)
MCHC: 32.7 g/dL (ref 30.0–36.0)
MCV: 90.8 fL (ref 80.0–100.0)
Platelets: 173 10*3/uL (ref 150–400)
RBC: 4.58 MIL/uL (ref 4.22–5.81)
RDW: 13.1 % (ref 11.5–15.5)
WBC: 10.1 10*3/uL (ref 4.0–10.5)
nRBC: 0 % (ref 0.0–0.2)

## 2018-11-02 LAB — GLUCOSE, CAPILLARY
Glucose-Capillary: 108 mg/dL — ABNORMAL HIGH (ref 70–99)
Glucose-Capillary: 164 mg/dL — ABNORMAL HIGH (ref 70–99)
Glucose-Capillary: 185 mg/dL — ABNORMAL HIGH (ref 70–99)
Glucose-Capillary: 161 mg/dL — ABNORMAL HIGH (ref 70–99)

## 2018-11-02 LAB — HEMOGLOBIN A1C
Hgb A1c MFr Bld: 6 % — ABNORMAL HIGH (ref 4.8–5.6)
Mean Plasma Glucose: 125.5 mg/dL

## 2018-11-02 LAB — CREATININE, SERUM
Creatinine, Ser: 0.79 mg/dL (ref 0.61–1.24)
GFR calc Af Amer: >60 mL/min (ref >60)
GFR calc non Af Amer: >60 mL/min (ref >60)

## 2018-11-02 IMAGING — DX DG HIP (WITH OR WITHOUT PELVIS) 2-3V RIGHT
2 series · 2 of 2 positions shown · non-contrast
Comparison: CT pelvis [DATE].

CLINICAL DATA: 67-year-old male status post right hip revision.

EXAM:
DG HIP (WITH OR WITHOUT PELVIS) 2-3V RIGHT

[hip ap]
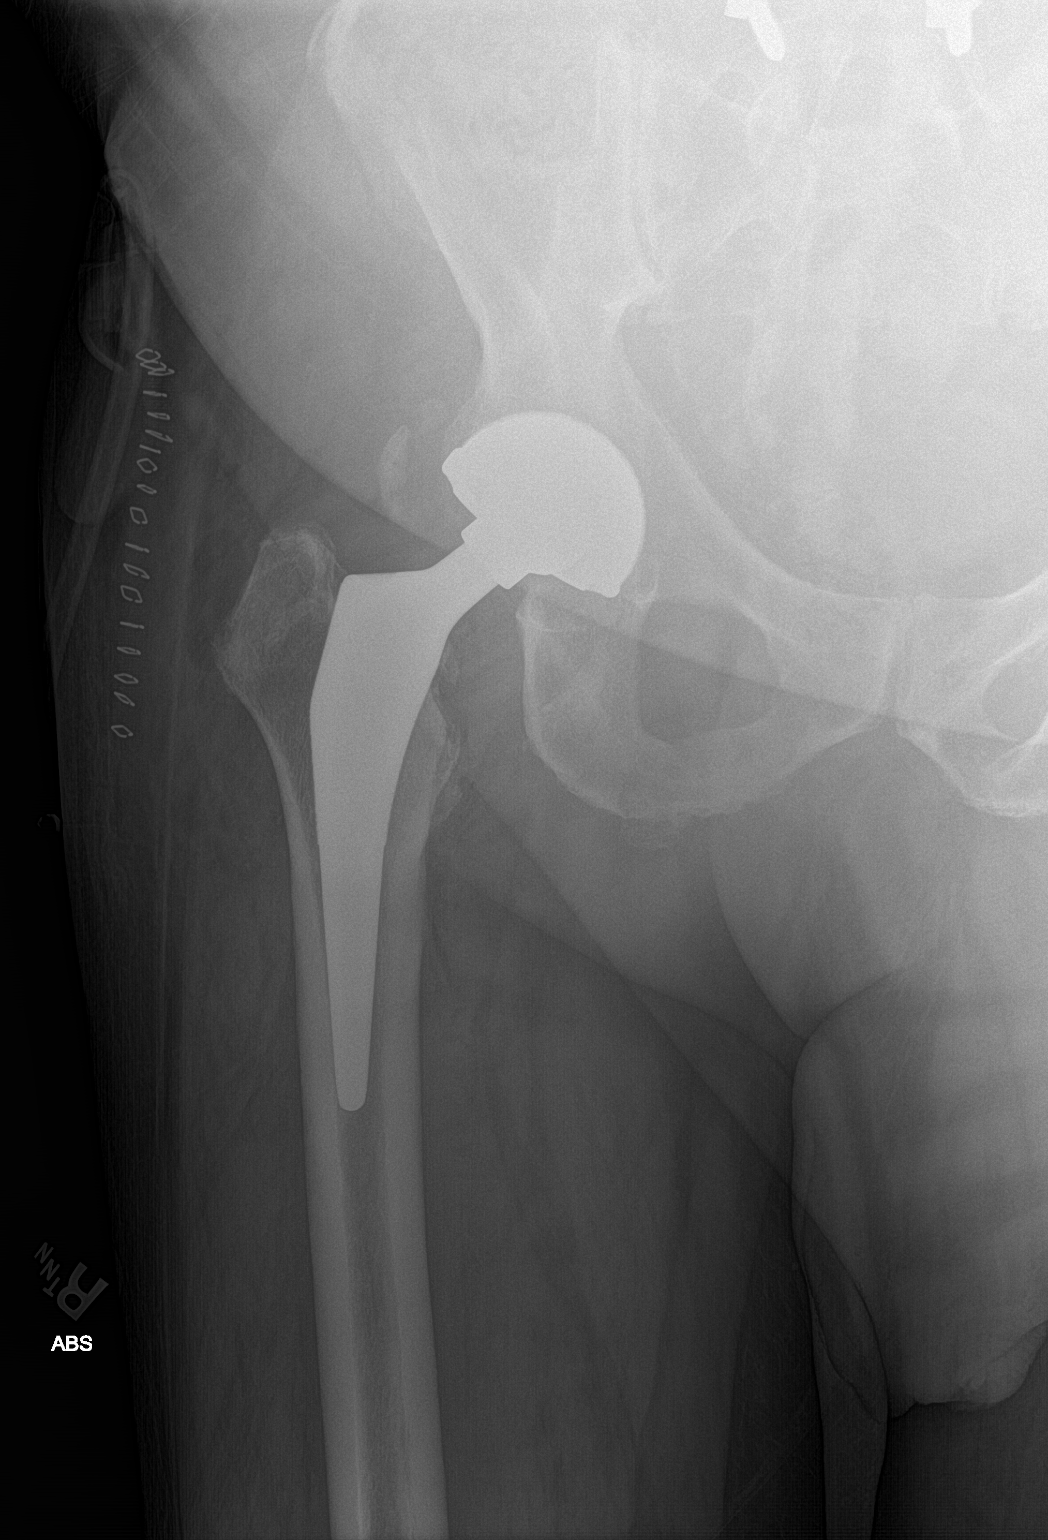

[hip lat]
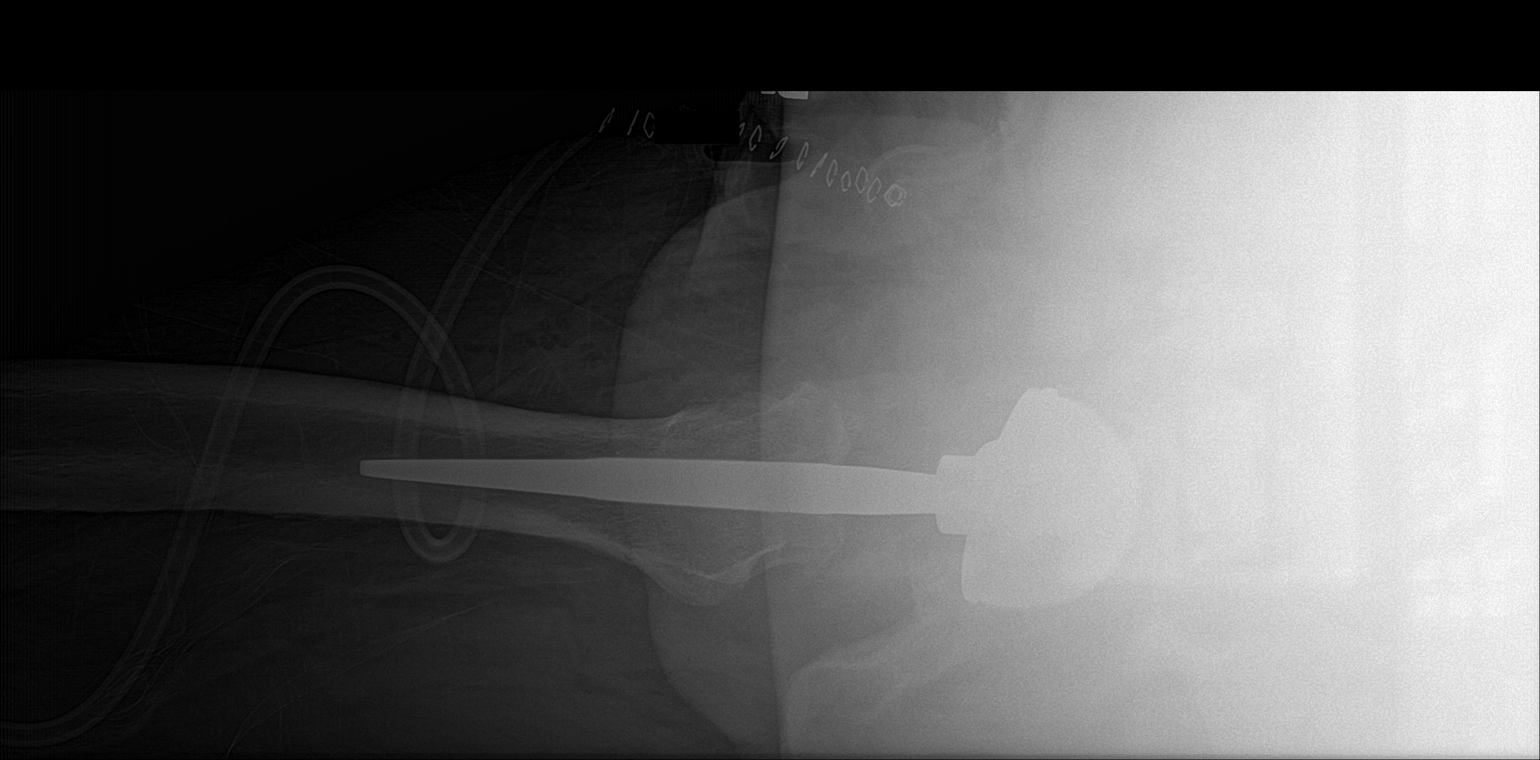

[2 of 2 positions shown; findings below may reference images not displayed]

FINDINGS: AP and cross-table lateral views at [0B] hours. Bipolar hip
arthroplasty is in place and appears normally located. Heterotopic
ossification redemonstrated about the lateral right hip. No adverse
hardware features. No acute osseous abnormality identified. Skin
staples overlie the right hip. Partially visible lumbar spine
hardware.
IMPRESSION: Right bipolar hip arthroplasty with no adverse features identified.

## 2018-11-02 IMAGING — XA OPERATIVE RIGHT HIP WITH PELVIS
2 series · 2 of 2 positions shown · non-contrast
Comparison: Pelvis CT [DATE].

FLUOROSCOPY TIME:  0 minutes 6 seconds

CLINICAL DATA: 67-year-old male undergoing right hip revision,
excision of heterotopic ossification.

EXAM:
OPERATIVE right HIP (WITH PELVIS IF PERFORMED) 2 VIEWS
TECHNIQUE: Fluoroscopic spot image(s) were submitted for interpretation
post-operatively.

[Series 6: ortho standard · 1 of 1 slices shown (1 of 2)]
[im 1/1]
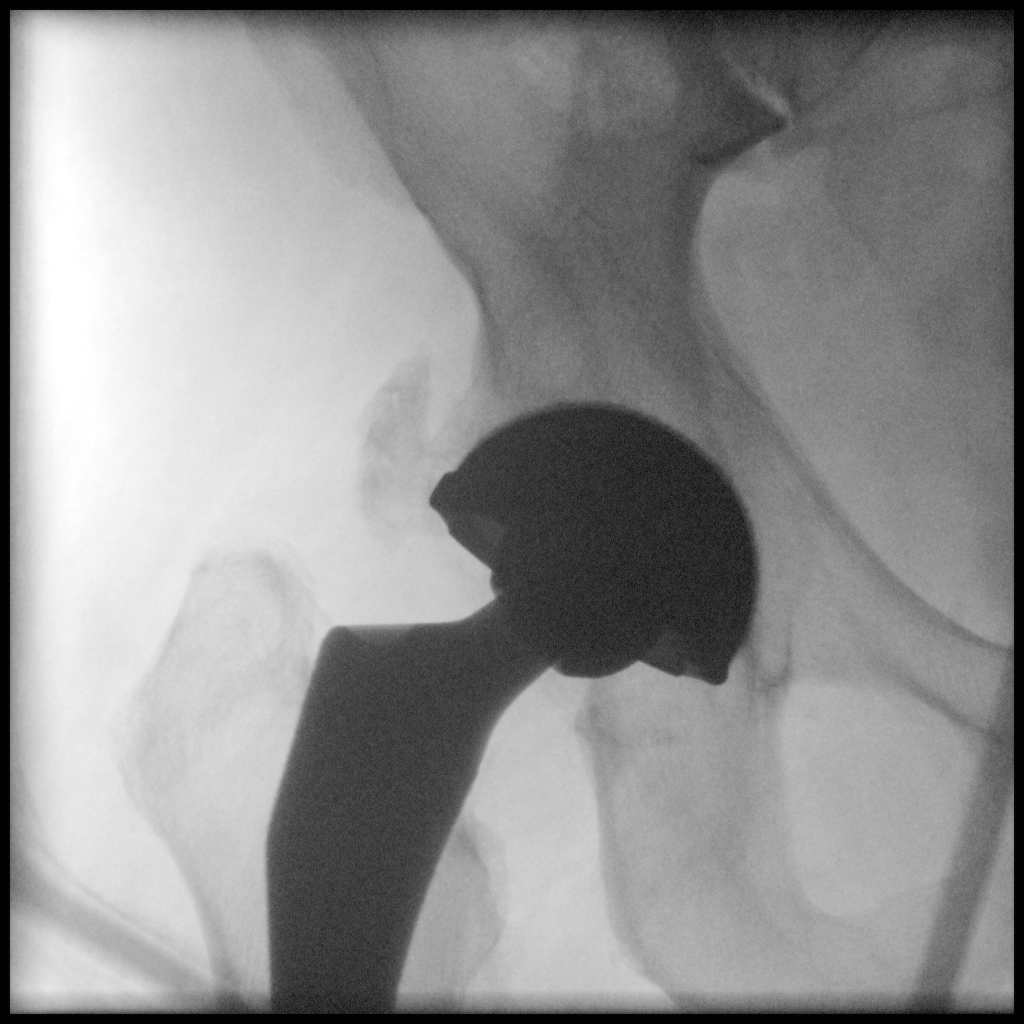

[Series 10: ortho standard · 1 of 1 slices shown (2 of 2)]
[im 1/1]
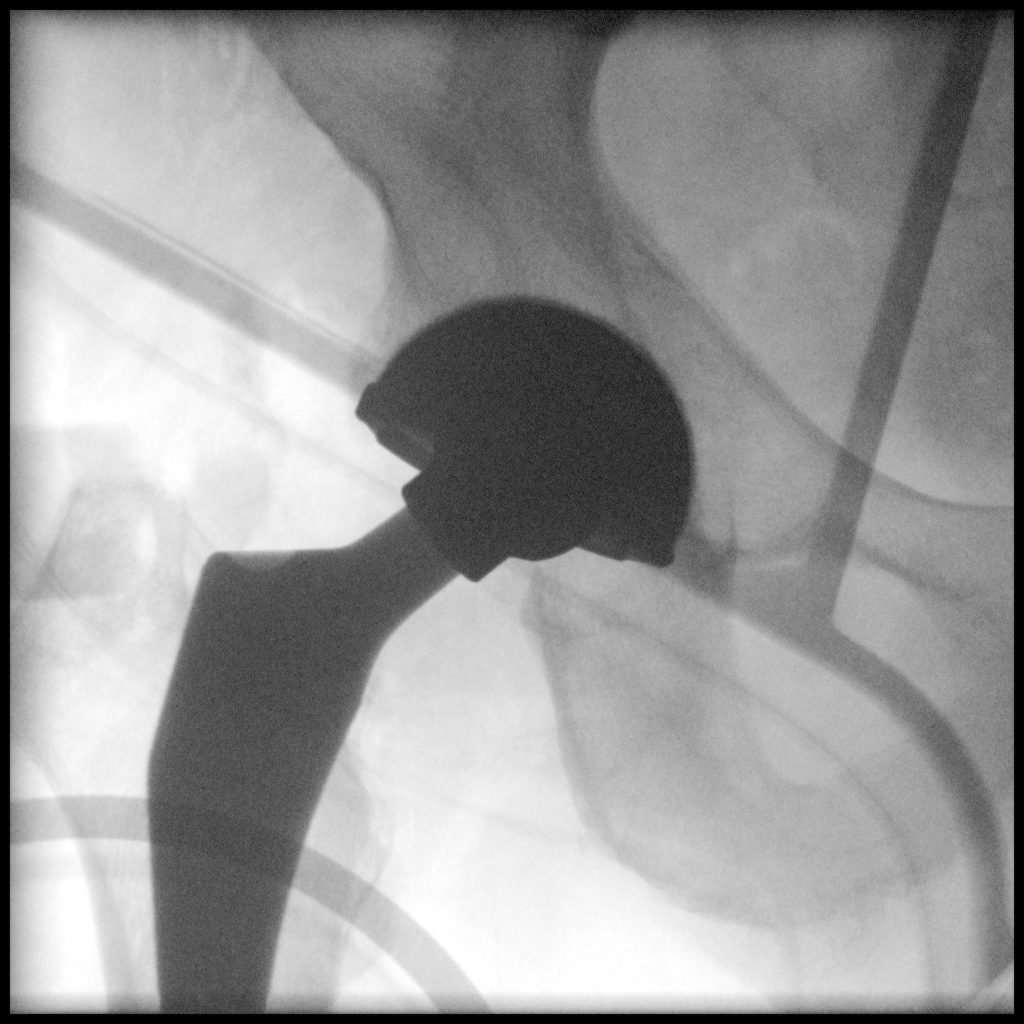

[2 of 2 positions shown; findings below may reference images not displayed]

FINDINGS: 2 intraoperative fluoroscopic spot views of the right hip. Bipolar
hip arthroplasty demonstrated. Hardware components appear normally
aligned. Heterotopic ossification again noted about the lateral
acetabulum on the initial view.
IMPRESSION: Intraoperative images of right bipolar hip arthroplasty with no
adverse features.

## 2018-11-02 SURGERY — TOTAL HIP REVISION
Anesthesia: General | Site: Hip | Laterality: Right

## 2018-11-02 MED ORDER — DEXAMETHASONE SODIUM PHOSPHATE 10 MG/ML IJ SOLN
INTRAMUSCULAR | Status: DC | PRN
  Administered 2018-11-02: 10 mg via INTRAVENOUS

## 2018-11-02 MED ORDER — ACETAMINOPHEN 10 MG/ML IV SOLN
INTRAVENOUS | Status: DC | PRN
  Administered 2018-11-02: 1000 mg via INTRAVENOUS

## 2018-11-02 MED ORDER — METHOCARBAMOL 1000 MG/10ML IJ SOLN
500.0000 mg | Freq: Four times a day (QID) | INTRAVENOUS | Status: DC | PRN
  Filled 2018-11-02: qty 5

## 2018-11-02 MED ORDER — DIPHENHYDRAMINE HCL 12.5 MG/5ML PO ELIX: 12.5000 mg | ORAL_SOLUTION | ORAL | Status: DC | PRN

## 2018-11-02 MED ORDER — OXYCODONE HCL 5 MG PO TABS
5.0000 mg | ORAL_TABLET | ORAL | Status: DC | PRN
  Administered 2018-11-05: 10:00:00 5 mg via ORAL
  Filled 2018-11-02: qty 1

## 2018-11-02 MED ORDER — METOCLOPRAMIDE HCL 5 MG/ML IJ SOLN: 5.0000 mg | Freq: Three times a day (TID) | INTRAMUSCULAR | Status: DC | PRN

## 2018-11-02 MED ORDER — HYDROMORPHONE HCL 1 MG/ML IJ SOLN: 0.5000 mg | INTRAMUSCULAR | Status: DC | PRN

## 2018-11-02 MED ORDER — ROCURONIUM BROMIDE 100 MG/10ML IV SOLN
INTRAVENOUS | Status: DC | PRN
  Administered 2018-11-02: 20 mg via INTRAVENOUS
  Administered 2018-11-02: 10 mg via INTRAVENOUS
  Administered 2018-11-02: 20 mg via INTRAVENOUS
  Administered 2018-11-02: 40 mg via INTRAVENOUS

## 2018-11-02 MED ORDER — MIDAZOLAM HCL 2 MG/2ML IJ SOLN
INTRAMUSCULAR | Status: AC
  Filled 2018-11-02: qty 2

## 2018-11-02 MED ORDER — FENTANYL CITRATE (PF) 100 MCG/2ML IJ SOLN
INTRAMUSCULAR | Status: AC
  Filled 2018-11-02: qty 2

## 2018-11-02 MED ORDER — SODIUM CHLORIDE 0.9 % IV SOLN
INTRAVENOUS | Status: DC
  Administered 2018-11-02: 07:00:00 via INTRAVENOUS

## 2018-11-02 MED ORDER — MAGNESIUM HYDROXIDE 400 MG/5ML PO SUSP
30.0000 mL | Freq: Every day | ORAL | Status: DC | PRN
  Administered 2018-11-04: 30 mL via ORAL
  Filled 2018-11-02: qty 30

## 2018-11-02 MED ORDER — ONDANSETRON HCL 4 MG/2ML IJ SOLN: 4.0000 mg | Freq: Once | INTRAMUSCULAR | Status: DC | PRN

## 2018-11-02 MED ORDER — LACTATED RINGERS IV SOLN
INTRAVENOUS | Status: DC | PRN
  Administered 2018-11-02: 09:00:00 via INTRAVENOUS

## 2018-11-02 MED ORDER — TRAMADOL HCL 50 MG PO TABS
50.0000 mg | ORAL_TABLET | Freq: Four times a day (QID) | ORAL | Status: DC
  Administered 2018-11-02 – 2018-11-05 (×9): 50 mg via ORAL
  Filled 2018-11-02 (×9): qty 1

## 2018-11-02 MED ORDER — ENOXAPARIN SODIUM 40 MG/0.4ML ~~LOC~~ SOLN
40.0000 mg | Freq: Two times a day (BID) | SUBCUTANEOUS | Status: DC
  Administered 2018-11-03 – 2018-11-05 (×5): 40 mg via SUBCUTANEOUS
  Filled 2018-11-02 (×5): qty 0.4

## 2018-11-02 MED ORDER — SEVOFLURANE IN SOLN
RESPIRATORY_TRACT | Status: AC
  Filled 2018-11-02: qty 250

## 2018-11-02 MED ORDER — HYDROMORPHONE HCL 1 MG/ML IJ SOLN
INTRAMUSCULAR | Status: AC
  Filled 2018-11-02: qty 1

## 2018-11-02 MED ORDER — LORAZEPAM 2 MG/ML IJ SOLN
4.0000 mg | Freq: Once | INTRAMUSCULAR | Status: AC
  Administered 2018-11-03: 13:00:00 4 mg via INTRAVENOUS
  Filled 2018-11-02: qty 2

## 2018-11-02 MED ORDER — GLIMEPIRIDE 4 MG PO TABS
4.0000 mg | ORAL_TABLET | Freq: Every day | ORAL | Status: DC
  Administered 2018-11-02 – 2018-11-04 (×3): 4 mg via ORAL
  Filled 2018-11-02 (×4): qty 1

## 2018-11-02 MED ORDER — FENTANYL CITRATE (PF) 100 MCG/2ML IJ SOLN
25.0000 ug | INTRAMUSCULAR | Status: DC | PRN
  Administered 2018-11-02 (×4): 25 ug via INTRAVENOUS

## 2018-11-02 MED ORDER — SODIUM CHLORIDE 0.9 % IV SOLN
INTRAVENOUS | Status: DC | PRN
  Administered 2018-11-02: 60 mL

## 2018-11-02 MED ORDER — PHENOL 1.4 % MT LIQD
1.0000 | OROMUCOSAL | Status: DC | PRN
  Filled 2018-11-02: qty 177

## 2018-11-02 MED ORDER — BISACODYL 5 MG PO TBEC: 5.0000 mg | DELAYED_RELEASE_TABLET | Freq: Every day | ORAL | Status: DC | PRN

## 2018-11-02 MED ORDER — PROPOFOL 500 MG/50ML IV EMUL
INTRAVENOUS | Status: AC
  Filled 2018-11-02: qty 50

## 2018-11-02 MED ORDER — METFORMIN HCL 500 MG PO TABS
1000.0000 mg | ORAL_TABLET | Freq: Two times a day (BID) | ORAL | Status: DC
  Administered 2018-11-02 – 2018-11-05 (×6): 1000 mg via ORAL
  Filled 2018-11-02 (×6): qty 2

## 2018-11-02 MED ORDER — PHENYLEPHRINE HCL (PRESSORS) 10 MG/ML IV SOLN
INTRAVENOUS | Status: DC | PRN
  Administered 2018-11-02: 100 ug via INTRAVENOUS

## 2018-11-02 MED ORDER — DOCUSATE SODIUM 100 MG PO CAPS
100.0000 mg | ORAL_CAPSULE | Freq: Two times a day (BID) | ORAL | Status: DC
  Administered 2018-11-02 – 2018-11-05 (×6): 100 mg via ORAL
  Filled 2018-11-02 (×6): qty 1

## 2018-11-02 MED ORDER — ENOXAPARIN SODIUM 40 MG/0.4ML ~~LOC~~ SOLN: 40.0000 mg | SUBCUTANEOUS | Status: DC

## 2018-11-02 MED ORDER — NEOMYCIN-POLYMYXIN B GU 40-200000 IR SOLN
Status: DC | PRN
  Administered 2018-11-02: 14 mL

## 2018-11-02 MED ORDER — ACETAMINOPHEN 500 MG PO TABS
1000.0000 mg | ORAL_TABLET | Freq: Four times a day (QID) | ORAL | Status: AC
  Administered 2018-11-02 (×3): 1000 mg via ORAL
  Filled 2018-11-02 (×4): qty 2

## 2018-11-02 MED ORDER — BUPIVACAINE-EPINEPHRINE (PF) 0.25% -1:200000 IJ SOLN
INTRAMUSCULAR | Status: AC
  Filled 2018-11-02: qty 30

## 2018-11-02 MED ORDER — HYDROMORPHONE HCL 1 MG/ML IJ SOLN
0.5000 mg | INTRAMUSCULAR | Status: DC | PRN
  Administered 2018-11-02 (×2): 0.5 mg via INTRAVENOUS

## 2018-11-02 MED ORDER — SODIUM CHLORIDE 0.9 % IV SOLN
INTRAVENOUS | Status: DC
  Administered 2018-11-02: 22:00:00 via INTRAVENOUS

## 2018-11-02 MED ORDER — DIAZEPAM 5 MG PO TABS
5.0000 mg | ORAL_TABLET | Freq: Four times a day (QID) | ORAL | Status: DC | PRN
  Administered 2018-11-02: 5 mg via ORAL
  Filled 2018-11-02: qty 1

## 2018-11-02 MED ORDER — ONDANSETRON HCL 4 MG/2ML IJ SOLN: 4.0000 mg | Freq: Four times a day (QID) | INTRAMUSCULAR | Status: DC | PRN

## 2018-11-02 MED ORDER — ZOLPIDEM TARTRATE 5 MG PO TABS: 5.0000 mg | ORAL_TABLET | Freq: Every evening | ORAL | Status: DC | PRN

## 2018-11-02 MED ORDER — MAGNESIUM OXIDE 400 (241.3 MG) MG PO TABS
400.0000 mg | ORAL_TABLET | ORAL | Status: DC
  Administered 2018-11-04: 400 mg via ORAL
  Filled 2018-11-02 (×3): qty 1

## 2018-11-02 MED ORDER — LORAZEPAM 2 MG/ML IJ SOLN
INTRAMUSCULAR | Status: AC
  Administered 2018-11-02: 15:00:00 2 mg via INTRAVENOUS
  Filled 2018-11-02: qty 1

## 2018-11-02 MED ORDER — CEFAZOLIN SODIUM-DEXTROSE 2-4 GM/100ML-% IV SOLN
2.0000 g | Freq: Four times a day (QID) | INTRAVENOUS | Status: AC
  Administered 2018-11-02 – 2018-11-03 (×2): 2 g via INTRAVENOUS
  Filled 2018-11-02 (×3): qty 100

## 2018-11-02 MED ORDER — SUCCINYLCHOLINE CHLORIDE 20 MG/ML IJ SOLN
INTRAMUSCULAR | Status: DC | PRN
  Administered 2018-11-02: 180 mg via INTRAVENOUS

## 2018-11-02 MED ORDER — FAMOTIDINE 20 MG PO TABS
20.0000 mg | ORAL_TABLET | Freq: Once | ORAL | Status: AC
  Administered 2018-11-02: 07:00:00 20 mg via ORAL

## 2018-11-02 MED ORDER — INSULIN ASPART 100 UNIT/ML ~~LOC~~ SOLN
0.0000 [IU] | Freq: Three times a day (TID) | SUBCUTANEOUS | Status: DC
  Administered 2018-11-05: 2 [IU] via SUBCUTANEOUS
  Filled 2018-11-02: qty 1

## 2018-11-02 MED ORDER — ZOLPIDEM TARTRATE 5 MG PO TABS: 10.0000 mg | ORAL_TABLET | Freq: Every evening | ORAL | Status: DC | PRN

## 2018-11-02 MED ORDER — SODIUM CHLORIDE FLUSH 0.9 % IV SOLN
INTRAVENOUS | Status: AC
  Filled 2018-11-02: qty 40

## 2018-11-02 MED ORDER — SUGAMMADEX SODIUM 500 MG/5ML IV SOLN
INTRAVENOUS | Status: DC | PRN
  Administered 2018-11-02: 400 mg via INTRAVENOUS

## 2018-11-02 MED ORDER — METOCLOPRAMIDE HCL 10 MG PO TABS: 5.0000 mg | ORAL_TABLET | Freq: Three times a day (TID) | ORAL | Status: DC | PRN

## 2018-11-02 MED ORDER — CYANOCOBALAMIN 2500 MCG PO CHEW: 2500.0000 ug | CHEWABLE_TABLET | ORAL | Status: DC

## 2018-11-02 MED ORDER — PRAVASTATIN SODIUM 20 MG PO TABS
40.0000 mg | ORAL_TABLET | Freq: Every day | ORAL | Status: DC
  Administered 2018-11-02 – 2018-11-04 (×3): 40 mg via ORAL
  Filled 2018-11-02 (×3): qty 2

## 2018-11-02 MED ORDER — LIDOCAINE HCL (CARDIAC) PF 100 MG/5ML IV SOSY
PREFILLED_SYRINGE | INTRAVENOUS | Status: DC | PRN
  Administered 2018-11-02: 100 mg via INTRAVENOUS

## 2018-11-02 MED ORDER — MENTHOL 3 MG MT LOZG
1.0000 | LOZENGE | OROMUCOSAL | Status: DC | PRN
  Filled 2018-11-02: qty 9

## 2018-11-02 MED ORDER — ALUM & MAG HYDROXIDE-SIMETH 200-200-20 MG/5ML PO SUSP: 30.0000 mL | ORAL | Status: DC | PRN

## 2018-11-02 MED ORDER — METHOCARBAMOL 500 MG PO TABS: 500.0000 mg | ORAL_TABLET | Freq: Four times a day (QID) | ORAL | Status: DC | PRN

## 2018-11-02 MED ORDER — PROPOFOL 10 MG/ML IV BOLUS
INTRAVENOUS | Status: DC | PRN
  Administered 2018-11-02: 200 mg via INTRAVENOUS

## 2018-11-02 MED ORDER — MIDAZOLAM HCL 2 MG/2ML IJ SOLN
INTRAMUSCULAR | Status: DC | PRN
  Administered 2018-11-02: 4 mg via INTRAVENOUS

## 2018-11-02 MED ORDER — FENTANYL CITRATE (PF) 100 MCG/2ML IJ SOLN
INTRAMUSCULAR | Status: DC | PRN
  Administered 2018-11-02 (×4): 50 ug via INTRAVENOUS

## 2018-11-02 MED ORDER — ONDANSETRON HCL 4 MG/2ML IJ SOLN
INTRAMUSCULAR | Status: DC | PRN
  Administered 2018-11-02: 4 mg via INTRAVENOUS

## 2018-11-02 MED ORDER — BUPIVACAINE LIPOSOME 1.3 % IJ SUSP
INTRAMUSCULAR | Status: AC
  Filled 2018-11-02: qty 20

## 2018-11-02 MED ORDER — MAGNESIUM CITRATE PO SOLN
1.0000 | Freq: Once | ORAL | Status: DC | PRN
  Filled 2018-11-02: qty 296

## 2018-11-02 MED ORDER — NEOMYCIN-POLYMYXIN B GU 40-200000 IR SOLN
Status: AC
  Filled 2018-11-02: qty 20

## 2018-11-02 MED ORDER — ACETAMINOPHEN 10 MG/ML IV SOLN
INTRAVENOUS | Status: AC
  Filled 2018-11-02: qty 100

## 2018-11-02 MED ORDER — HYDROMORPHONE HCL 1 MG/ML IJ SOLN
0.5000 mg | INTRAMUSCULAR | Status: AC | PRN
  Administered 2018-11-02 (×4): 0.5 mg via INTRAVENOUS

## 2018-11-02 MED ORDER — ONDANSETRON HCL 4 MG PO TABS: 4.0000 mg | ORAL_TABLET | Freq: Four times a day (QID) | ORAL | Status: DC | PRN

## 2018-11-02 MED ORDER — ACETAMINOPHEN 325 MG PO TABS: 325.0000 mg | ORAL_TABLET | Freq: Four times a day (QID) | ORAL | Status: DC | PRN

## 2018-11-02 MED ORDER — LORAZEPAM 2 MG PO TABS
2.0000 mg | ORAL_TABLET | ORAL | Status: DC | PRN
  Administered 2018-11-02 – 2018-11-04 (×3): 2 mg via ORAL
  Filled 2018-11-02 (×3): qty 1

## 2018-11-02 MED ORDER — VITAMIN B-12 1000 MCG PO TABS
2500.0000 ug | ORAL_TABLET | ORAL | Status: DC
  Administered 2018-11-04: 2500 ug via ORAL
  Filled 2018-11-02 (×2): qty 3

## 2018-11-02 MED ORDER — FAMOTIDINE 20 MG PO TABS
ORAL_TABLET | ORAL | Status: AC
  Administered 2018-11-02: 07:00:00 20 mg via ORAL
  Filled 2018-11-02: qty 1

## 2018-11-02 MED ORDER — OXYCODONE HCL 5 MG PO TABS
10.0000 mg | ORAL_TABLET | ORAL | Status: DC | PRN
  Administered 2018-11-02 – 2018-11-03 (×3): 10 mg via ORAL
  Filled 2018-11-02 (×4): qty 2

## 2018-11-02 MED ORDER — DEXTROSE 5 % IV SOLN
3.0000 g | Freq: Once | INTRAVENOUS | Status: AC
  Administered 2018-11-02: 3 g via INTRAVENOUS
  Filled 2018-11-02: qty 3

## 2018-11-02 MED ORDER — TRAZODONE HCL 100 MG PO TABS
100.0000 mg | ORAL_TABLET | Freq: Every day | ORAL | Status: DC
  Administered 2018-11-02 – 2018-11-04 (×3): 100 mg via ORAL
  Filled 2018-11-02 (×3): qty 1

## 2018-11-02 MED ORDER — LORAZEPAM 2 MG/ML IJ SOLN
2.0000 mg | Freq: Once | INTRAMUSCULAR | Status: AC
  Administered 2018-11-02: 2 mg via INTRAVENOUS

## 2018-11-02 SURGICAL SUPPLY — 58 items
BLADE SAGITTAL WIDE XTHICK NO (BLADE) IMPLANT
CANISTER SUCT 1200ML W/VALVE (MISCELLANEOUS) ×2 IMPLANT
CANISTER SUCT 3000ML PPV (MISCELLANEOUS) ×4 IMPLANT
CANISTER WOUND CARE 500ML ATS (WOUND CARE) ×2 IMPLANT
CHLORAPREP W/TINT 26 (MISCELLANEOUS) ×2 IMPLANT
COVER BACK TABLE REUSABLE LG (DRAPES) ×2 IMPLANT
COVER WAND RF STERILE (DRAPES) ×2 IMPLANT
DRAPE 3/4 80X56 (DRAPES) ×4 IMPLANT
DRAPE C-ARM XRAY 36X54 (DRAPES) ×2 IMPLANT
DRAPE C-ARMOR (DRAPES) IMPLANT
DRAPE INCISE IOBAN 66X60 STRL (DRAPES) ×4 IMPLANT
DRAPE POUCH INSTRU U-SHP 10X18 (DRAPES) ×2 IMPLANT
DRESSING SURGICEL FIBRLLR 1X2 (HEMOSTASIS) ×2 IMPLANT
DRSG SURGICEL FIBRILLAR 1X2 (HEMOSTASIS) ×4
ELECT BLADE 6.5 EXT (BLADE) ×2 IMPLANT
ELECT CAUTERY BLADE 6.4 (BLADE) ×2 IMPLANT
ELECT REM PT RETURN 9FT ADLT (ELECTROSURGICAL) ×2
ELECTRODE REM PT RTRN 9FT ADLT (ELECTROSURGICAL) ×1 IMPLANT
GLOVE BIOGEL PI IND STRL 9 (GLOVE) ×1 IMPLANT
GLOVE BIOGEL PI INDICATOR 9 (GLOVE) ×1
GLOVE INDICATOR 8.0 STRL GRN (GLOVE) ×2 IMPLANT
GLOVE SURG ORTHO 8.0 STRL STRW (GLOVE) ×2 IMPLANT
GLOVE SURG SYN 9.0  PF PI (GLOVE) ×2
GLOVE SURG SYN 9.0 PF PI (GLOVE) ×2 IMPLANT
GOWN STRL REUS W/ TWL LRG LVL3 (GOWN DISPOSABLE) ×1 IMPLANT
GOWN STRL REUS W/ TWL XL LVL3 (GOWN DISPOSABLE) ×1 IMPLANT
GOWN STRL REUS W/TWL LRG LVL3 (GOWN DISPOSABLE) ×1
GOWN STRL REUS W/TWL XL LVL3 (GOWN DISPOSABLE) ×1
HEMOSTAT SURGICEL 2X3 (HEMOSTASIS) ×4 IMPLANT
HIP FEM HD XL 28 (Head) ×2 IMPLANT
HOLDER FOLEY CATH W/STRAP (MISCELLANEOUS) ×2 IMPLANT
HOOD PEEL AWAY FLYTE STAYCOOL (MISCELLANEOUS) ×2 IMPLANT
KIT PREVENA INCISION MGT 13 (CANNISTER) ×2 IMPLANT
KIT TURNOVER KIT A (KITS) ×2 IMPLANT
LINER DBL MOB SZ 0 52MM (Liner) ×2 IMPLANT
NDL HPO THNWL 1X22GA REG BVL (NEEDLE) ×1 IMPLANT
NDL SAFETY ECLIPSE 18X1.5 (NEEDLE) ×1 IMPLANT
NEEDLE HYPO 18GX1.5 SHARP (NEEDLE) ×1
NEEDLE SAFETY 22GX1 (NEEDLE) ×1
NEEDLE SPNL 22GX3.5 QUINCKE BK (NEEDLE) ×2 IMPLANT
NS IRRIG 1000ML POUR BTL (IV SOLUTION) ×2 IMPLANT
PACK ANTERIOR HIP CUSTOM (KITS) ×2 IMPLANT
PACK HIP PROSTHESIS (MISCELLANEOUS) IMPLANT
PULSAVAC PLUS IRRIG FAN TIP (DISPOSABLE) ×2
SOL .9 NS 3000ML IRR  AL (IV SOLUTION) ×1
SOL .9 NS 3000ML IRR UROMATIC (IV SOLUTION) ×1 IMPLANT
STAPLER SKIN PROX 35W (STAPLE) ×2 IMPLANT
SUT DVC 2 QUILL PDO  T11 36X36 (SUTURE) ×1
SUT DVC 2 QUILL PDO T11 36X36 (SUTURE) ×1 IMPLANT
SUT ETHIBOND #5 BRAIDED 30INL (SUTURE) IMPLANT
SUT V-LOC 90 ABS DVC 3-0 CL (SUTURE) ×4 IMPLANT
SUT VIC AB 1 CT1 36 (SUTURE) ×2 IMPLANT
SYR 20ML LL LF (SYRINGE) ×2 IMPLANT
SYR 30ML LL (SYRINGE) ×2 IMPLANT
TIP FAN IRRIG PULSAVAC PLUS (DISPOSABLE) ×1 IMPLANT
TIP IRRIG/SUCT HIGH CAPACITY (MISCELLANEOUS) ×2 IMPLANT
TOWEL OR 17X26 4PK STRL BLUE (TOWEL DISPOSABLE) ×2 IMPLANT
TRAY FOLEY MTR SLVR 16FR STAT (SET/KITS/TRAYS/PACK) ×2 IMPLANT

## 2018-11-02 NOTE — Progress Notes (Signed)
MD ordered IV Ativan 2 mg x 1. Med administered as ordered, patient transferred from bed to stretcher as transporter here again to take pt to radiation.  Once patient on stretcher, notified by Rad Onc that treatment had been cancelled.  Staff on hand to assist patient back on to bed from stretcher and adjusted for comfort.  Requested from AD that pt receive orders from MD in the am for IV ativan if treatment is conducted in the am.

## 2018-11-02 NOTE — H&P (Signed)
Reviewed paper H+P, will be scanned into chart. No changes noted.  

## 2018-11-02 NOTE — Progress Notes (Addendum)
Patient admitted to unit from PACU in a panic state. Reporting nurse states that patient was thrashing back and forth on stretcher. Meds given as ordered by MD. Patient currently shaking and states he is having a panic attack. Tried talking patient down, offering suggestions, calming thoughts, diversional topics all to no avail. PO Valium given per order.

## 2018-11-02 NOTE — Anesthesia Post-op Follow-up Note (Signed)
Anesthesia QCDR form completed.        

## 2018-11-02 NOTE — Progress Notes (Addendum)
   11/02/18 1700  Clinical Encounter Type  Visited With Patient  Visit Type Initial;Social support  Referral From Nurse  Consult/Referral To Chaplain   Chaplain received an OR to provide to support to the patient. Upon arrival, the patient was resting in bed with the television on. He was welcoming, engaging, and open. The patient talked about what brought him to the hospital, along with other recent medical needs, and some of his professional, personal, and familial histories. The patient is a person of [place] who acknowledges God's role in his life, but also that he feels God can "be a trickster" because he values transparency, which he feels that God does not always provide to him. He would much rather know exactly what to expect, what is happening, etc. This chaplain acknowledged the patient's conundrum and affirmed that it's reasonable to want to have the answers we seek from God, but that not getting those answers in our timing (or every) ultimately catalyzes our faith.   The patient values family and the bonds they've created. 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 The patient appreciates the support of chaplains and invites follow-up. This chaplain provided support in the form of active and reflective listening and compassionate presence.

## 2018-11-02 NOTE — Progress Notes (Signed)
Recv'd verbal order from Dr. Ronelle Nigh for more dilaudid.

## 2018-11-02 NOTE — Progress Notes (Signed)
PT Cancellation Note  Patient Details Name: Brandyn Mccarther MRN: [id number] DOB: 04-Dec-1950   Cancelled Treatment:    Reason Eval/Treat Not Completed: Other (comment) Order received and chart reviewed. Nurse communicated and recommended hold PT session today due to pt's current anxiety and pain level. PT will re-attempt tomorrow when appropriate.    Sherrilyn Rist, SPT 11/02/2018, 2:15 PM

## 2018-11-02 NOTE — Progress Notes (Signed)
Radiation oncology present on unit to transport pt for radiation tx as ordered. Pt currrently has Valium on board.

## 2018-11-02 NOTE — Anesthesia Preprocedure Evaluation (Addendum)
Anesthesia Evaluation  Patient identified by MRN, date of birth, ID band Patient awake    Reviewed: Allergy & Precautions, NPO status , Patient's Chart, lab work & pertinent test results  History of Anesthesia Complications Negative for: history of anesthetic complications  Airway Mallampati: III       Dental   Pulmonary sleep apnea and Continuous Positive Airway Pressure Ventilation , neg COPD, Not current smoker,           Cardiovascular (-) hypertension(-) Past MI and (-) CHF (-) dysrhythmias (-) Valvular Problems/Murmurs     Neuro/Psych neg Seizures Anxiety Depression    GI/Hepatic hiatal hernia, GERD  Medicated and Controlled,(+) Hepatitis -  Endo/Other  diabetes, Type 2, Oral Hypoglycemic AgentsMorbid obesity  Renal/GU negative Renal ROS     Musculoskeletal   Abdominal   Peds  Hematology   Anesthesia Other Findings   Reproductive/Obstetrics                            Anesthesia Physical Anesthesia Plan  ASA: III  Anesthesia Plan: General   Post-op Pain Management:    Induction: Intravenous  PONV Risk Score and Plan: 2 and Dexamethasone and Ondansetron  Airway Management Planned: Oral ETT  Additional Equipment:   Intra-op Plan:   Post-operative Plan:   Informed Consent: I have reviewed the patients History and Physical, chart, labs and discussed the procedure including the risks, benefits and alternatives for the proposed anesthesia with the patient or authorized representative who has indicated his/her understanding and acceptance.       Plan Discussed with:   Anesthesia Plan Comments:         Anesthesia Quick Evaluation

## 2018-11-02 NOTE — Op Note (Signed)
11/02/2018  9:44 AM  PATIENT:  Mario [name]  68 y.o. male  PRE-OPERATIVE DIAGNOSIS:  HETEROTOPIC OSSIFICATION  POST-OPERATIVE DIAGNOSIS:  HETEROTOPIC OSSIFICATION  PROCEDURE:  Procedure(s): TOTAL HIP REVISION WITH EXCISION OF HETEROTOPIC OSSIFICATION (Right)  SURGEON: Laurene Footman, MD  ASSISTANTS: None  ANESTHESIA:   general  EBL:  Total I/O In: 1100 [I.V.:1100] Out: 450 [Urine:150; Blood:300]  BLOOD ADMINISTERED:none  DRAINS: none   LOCAL MEDICATIONS USED:  OTHER Exparel  SPECIMEN:  No Specimen  DISPOSITION OF SPECIMEN:  N/A  COUNTS:  YES  TOURNIQUET:  * No tourniquets in log *  IMPLANTS: Medacta 52 mm DM liner, 28 mm XL metal head  DICTATION: .Dragon Dictation   The patient was brought to the operating room and after spinal anesthesia was obtained patient was placed on the operative table with the ipsilateral foot into the Medacta attachment, contralateral leg on a well-padded table. C-arm was brought in and preop template x-ray taken. After prepping and draping in usual sterile fashion appropriate patient identification and timeout procedures were completed. Anterior approach to the hip was made through the prior incision and centered over the greater trochanter and TFL muscle. The subcutaneous tissue was incised hemostasis being achieved by electrocautery. TFL fascia was incised and the muscle retracted laterally deep retractor placed.  There was extensive scar tissue present and the heterotopic ossification was at the level of the capsule and appeared to impinge on the rectus.  It was excised at this point.  The anterior capsule was exposed and a capsulotomy performed.   The prior head and liner were removed with distraction applied and the scar tissue excised around the anterior capsule for mobilization.  Trials was then made with an XL head and this appeared to correct leg length inequality and so that was chosen as a final implant.  Exparel was injected throughout the  case in the periarticular tissues and the hip was irrigated with pulsatile lavage prior to placement of the implant after the implant was placed and the hip reduced x-ray showed good alignment.  The hip was reduced and was stable the wound was thoroughly irrigated with fibrillar placed along the posterior capsule and medial neck. The deep fascia ws closed using a heavy Quill after infiltration of 30 cc of quarter percent Sensorcaine with epinephrine.3-0 V-loc to close the skin with skin staples.  Incisional wound VAC applied patient was sent to recovery in stable condition.   PLAN OF CARE: Admit to inpatient

## 2018-11-02 NOTE — Progress Notes (Signed)
Patient on arrival to pacu, moving from side to side on hospital bed, patient extremely agitated in obvious pain on arrival.  Reassured patient and report received.  Pain medicines to be given as ordered.

## 2018-11-02 NOTE — Anesthesia Procedure Notes (Signed)
Procedure Name: Intubation Date/Time: 11/02/2018 7:37 AM Performed by: Nelda Marseille, CRNA Pre-anesthesia Checklist: Patient identified, Patient being monitored, Timeout performed, Emergency Drugs available and Suction available Patient Re-evaluated:Patient Re-evaluated prior to induction Oxygen Delivery Method: Circle system utilized Preoxygenation: Pre-oxygenation with 100% oxygen Induction Type: IV induction Ventilation: Mask ventilation without difficulty Laryngoscope Size: Mac, 3 and McGraph Grade View: Grade III Tube type: Oral Tube size: 7.0 mm Number of attempts: 1 Airway Equipment and Method: Stylet Placement Confirmation: ETT inserted through vocal cords under direct vision,  positive ETCO2 and breath sounds checked- equal and bilateral Secured at: 21 cm Tube secured with: Tape Dental Injury: Teeth and Oropharynx as per pre-operative assessment

## 2018-11-02 NOTE — Addendum Note (Signed)
Addendum  created 11/02/18 1107 by Nelda Marseille, CRNA   Intraprocedure Meds edited

## 2018-11-02 NOTE — Progress Notes (Signed)
Patient more relaxed, dozes off from time to time.  Awakens easily to voice and still reports pain.  Reassured patient we will continue to work on managing his pain safely as the floor will also.

## 2018-11-02 NOTE — Progress Notes (Signed)
15 minute call to floor. 

## 2018-11-02 NOTE — Transfer of Care (Signed)
Immediate Anesthesia Transfer of Care Note  Patient: Mario [name]  Procedure(s) Performed: TOTAL HIP REVISION WITH EXCISION OF HETEROTOPIC OSSIFICATION (Right Hip)  Patient Location: PACU  Anesthesia Type:General  Level of Consciousness: awake and sedated  Airway & Oxygen Therapy: Patient Spontanous Breathing and Patient connected to face mask oxygen  Post-op Assessment: Report given to RN and Post -op Vital signs reviewed and stable  Post vital signs: Reviewed and stable  Last Vitals:  Vitals Value Taken Time  BP    Temp    Pulse 75 11/02/18 0939  Resp 13 11/02/18 0939  SpO2 100 % 11/02/18 0939  Vitals shown include unvalidated device data.  Last Pain:  Vitals:   11/02/18 0636  TempSrc: Tympanic  PainSc: 4          Complications: No apparent anesthesia complications

## 2018-11-02 NOTE — Progress Notes (Signed)
Good strong pedal pulse in right foot.  Foot warm to touch and pink in color.

## 2018-11-02 NOTE — Anesthesia Postprocedure Evaluation (Signed)
Anesthesia Post Note  Patient: Mario [name]  Procedure(s) Performed: TOTAL HIP REVISION WITH EXCISION OF HETEROTOPIC OSSIFICATION (Right Hip)  Patient location during evaluation: PACU Anesthesia Type: General Level of consciousness: awake and alert Pain management: pain level controlled Vital Signs Assessment: post-procedure vital signs reviewed and stable Respiratory status: spontaneous breathing and respiratory function stable Cardiovascular status: stable Anesthetic complications: no     Last Vitals:  Vitals:   11/02/18 0955 11/02/18 1000  BP: (!) 155/68   Pulse: 63 66  Resp: 16 18  Temp:    SpO2: 100% 100%    Last Pain:  Vitals:   11/02/18 1000  TempSrc:   PainSc: 10-Worst pain ever                 KEPHART,WILLIAM K

## 2018-11-02 NOTE — Progress Notes (Signed)
Patient returned to unit via transporter and AC. Patient had panic attack enroute to radiation treatment and was unable to follow thru with treatment at this time. MD notified that  Patient was likely going to require something more before being able to complete the treatment session.

## 2018-11-03 ENCOUNTER — Ambulatory Visit
Admission: RE | Admit: 2018-11-03 | Discharge: 2018-11-03 | Disposition: A | Discharge status: Home / Self Care | Payer: Medicare Other | Source: Ambulatory Visit | Attending: Radiation Oncology | Admitting: Radiation Oncology

## 2018-11-03 LAB — GLUCOSE, CAPILLARY
Glucose-Capillary: 105 mg/dL — ABNORMAL HIGH (ref 70–99)
Glucose-Capillary: 116 mg/dL — ABNORMAL HIGH (ref 70–99)
Glucose-Capillary: 89 mg/dL (ref 70–99)
Glucose-Capillary: 114 mg/dL — ABNORMAL HIGH (ref 70–99)

## 2018-11-03 LAB — CBC
HCT: 38.2 % — ABNORMAL LOW (ref 39.0–52.0)
Hemoglobin: 12.6 g/dL — ABNORMAL LOW (ref 13.0–17.0)
MCH: 29.9 pg (ref 26.0–34.0)
MCHC: 33 g/dL (ref 30.0–36.0)
MCV: 90.7 fL (ref 80.0–100.0)
Platelets: 176 10*3/uL (ref 150–400)
RBC: 4.21 MIL/uL — ABNORMAL LOW (ref 4.22–5.81)
RDW: 12.8 % (ref 11.5–15.5)
WBC: 10.8 10*3/uL — ABNORMAL HIGH (ref 4.0–10.5)
nRBC: 0 % (ref 0.0–0.2)

## 2018-11-03 LAB — BASIC METABOLIC PANEL
Anion gap: 7 (ref 5–15)
BUN: 9 mg/dL (ref 8–23)
CO2: 26 mmol/L (ref 22–32)
Calcium: 8.6 mg/dL — ABNORMAL LOW (ref 8.9–10.3)
Chloride: 104 mmol/L (ref 98–111)
Creatinine, Ser: 0.8 mg/dL (ref 0.61–1.24)
GFR calc Af Amer: >60 mL/min (ref >60)
GFR calc non Af Amer: >60 mL/min (ref >60)
Glucose, Bld: 147 mg/dL — ABNORMAL HIGH (ref 70–99)
Potassium: 5.1 mmol/L (ref 3.5–5.1)
Sodium: 137 mmol/L (ref 135–145)

## 2018-11-03 MED ORDER — DIAZEPAM 5 MG PO TABS: 5.0000 mg | ORAL_TABLET | Freq: Four times a day (QID) | ORAL | 0 refills | Status: DC | PRN

## 2018-11-03 MED ORDER — OXYCODONE HCL 5 MG PO TABS: 5.0000 mg | ORAL_TABLET | ORAL | 0 refills | Status: DC | PRN

## 2018-11-03 MED ORDER — ENOXAPARIN SODIUM 40 MG/0.4ML ~~LOC~~ SOLN: 40.0000 mg | SUBCUTANEOUS | 0 refills | Status: DC

## 2018-11-03 NOTE — Discharge Instructions (Signed)
ANTERIOR APPROACH TOTAL HIP HETEROTOPIC OSSIFICATION EXCISION POSTOPERATIVE DIRECTIONS   Hip Rehabilitation, Guidelines Following Surgery  The results of a hip operation are greatly improved after range of motion and muscle strengthening exercises. Follow all safety measures which are given to protect your hip. If any of these exercises cause increased pain or swelling in your joint, decrease the amount until you are comfortable again. Then slowly increase the exercises. Call your caregiver if you have problems or questions.   HOME CARE INSTRUCTIONS  Remove items at home which could result in a fall. This includes throw rugs or furniture in walking pathways.   ICE to the affected hip every three hours for 30 minutes at a time and then as needed for pain and swelling.  Continue to use ice on the hip for pain and swelling from surgery. You may notice swelling that will progress down to the foot and ankle.  This is normal after surgery.  Elevate the leg when you are not up walking on it.    Continue to use the breathing machine which will help keep your temperature down.  It is common for your temperature to cycle up and down following surgery, especially at night when you are not up moving around and exerting yourself.  The breathing machine keeps your lungs expanded and your temperature down.  Do not place pillow under knee, focus on keeping the knee straight while resting  DIET You may resume your previous home diet once your are discharged from the hospital.  DRESSING / WOUND CARE / SHOWERING Please remove provena negative pressure dressing on 11/11/2018 and apply honey comb dressing. Keep dressing clean and dry at all times.   ACTIVITY Walk with your walker as instructed. Use walker as long as suggested by your caregivers. Avoid periods of inactivity such as sitting longer than an hour when not asleep. This helps prevent blood clots.  You may resume a sexual relationship in one month or  when given the OK by your doctor.  You may return to work once you are cleared by your doctor.  Do not drive a car for 6 weeks or until released by you surgeon.  Do not drive while taking narcotics.  WEIGHT BEARING Weight bearing as tolerated. Use walker/cane as needed for at least 4 weeks post op.  POSTOPERATIVE CONSTIPATION PROTOCOL Constipation - defined medically as fewer than three stools per week and severe constipation as less than one stool per week.  One of the most common issues patients have following surgery is constipation.  Even if you have a regular bowel pattern at home, your normal regimen is likely to be disrupted due to multiple reasons following surgery.  Combination of anesthesia, postoperative narcotics, change in appetite and fluid intake all can affect your bowels.  In order to avoid complications following surgery, here are some recommendations in order to help you during your recovery period.  Colace (docusate) - Pick up an over-the-counter form of Colace or another stool softener and take twice a day as long as you are requiring postoperative pain medications.  Take with a full glass of water daily.  If you experience loose stools or diarrhea, hold the colace until you stool forms back up.  If your symptoms do not get better within 1 week or if they get worse, check with your doctor.  Dulcolax (bisacodyl) - Pick up over-the-counter and take as directed by the product packaging as needed to assist with the movement of your bowels.  Take  with a full glass of water.  Use this product as needed if not relieved by Colace only.   MiraLax (polyethylene glycol) - Pick up over-the-counter to have on hand.  MiraLax is a solution that will increase the amount of water in your bowels to assist with bowel movements.  Take as directed and can mix with a glass of water, juice, soda, coffee, or tea.  Take if you go more than two days without a movement. Do not use MiraLax more than once  per day. Call your doctor if you are still constipated or irregular after using this medication for 7 days in a row.  If you continue to have problems with postoperative constipation, please contact the office for further assistance and recommendations.  If you experience "the worst abdominal pain ever" or develop nausea or vomiting, please contact the office immediatly for further recommendations for treatment.  ITCHING  If you experience itching with your medications, try taking only a single pain pill, or even half a pain pill at a time.  You can also use Benadryl over the counter for itching or also to help with sleep.   TED HOSE STOCKINGS Wear the elastic stockings on both legs for six weeks following surgery during the day but you may remove then at night for sleeping.  MEDICATIONS See your medication summary on the After Visit Summary that the nursing staff will review with you prior to discharge.  You may have some home medications which will be placed on hold until you complete the course of blood thinner medication.  It is important for you to complete the blood thinner medication as prescribed by your surgeon.  Continue your approved medications as instructed at time of discharge.  PRECAUTIONS If you experience chest pain or shortness of breath - call 911 immediately for transfer to the hospital emergency department.  If you develop a fever greater that 101 F, purulent drainage from wound, increased redness or drainage from wound, foul odor from the wound/dressing, or calf pain - CONTACT YOUR SURGEON.                                                   FOLLOW-UP APPOINTMENTS Make sure you keep all of your appointments after your operation with your surgeon and caregivers. You should call the office at the above phone number and make an appointment for approximately two weeks after the date of your surgery or on the date instructed by your surgeon outlined in the "After Visit  Summary".  RANGE OF MOTION AND STRENGTHENING EXERCISES  These exercises are designed to help you keep full movement of your hip joint. Follow your caregiver's or physical therapist's instructions. Perform all exercises about fifteen times, three times per day or as directed. Exercise both hips, even if you have had only one joint replacement. These exercises can be done on a training (exercise) mat, on the floor, on a table or on a bed. Use whatever works the best and is most comfortable for you. Use music or television while you are exercising so that the exercises are a pleasant break in your day. This will make your life better with the exercises acting as a break in routine you can look forward to.  Lying on your back, slowly slide your foot toward your buttocks, raising your knee up off the  floor. Then slowly slide your foot back down until your leg is straight again.  Lying on your back spread your legs as far apart as you can without causing discomfort.  Lying on your side, raise your upper leg and foot straight up from the floor as far as is comfortable. Slowly lower the leg and repeat.  Lying on your back, tighten up the muscle in the front of your thigh (quadriceps muscles). You can do this by keeping your leg straight and trying to raise your heel off the floor. This helps strengthen the largest muscle supporting your knee.  Lying on your back, tighten up the muscles of your buttocks both with the legs straight and with the knee bent at a comfortable angle while keeping your heel on the floor.   IF YOU ARE TRANSFERRED TO A SKILLED REHAB FACILITY If the patient is transferred to a skilled rehab facility following release from the hospital, a list of the current medications will be sent to the facility for the patient to continue.  When discharged from the skilled rehab facility, please have the facility set up the patient's [place] prior to being released. Also, the skilled  facility will be responsible for providing the patient with their medications at time of release from the facility to include their pain medication, the muscle relaxants, and their blood thinner medication. If the patient is still at the rehab facility at time of the two week follow up appointment, the skilled rehab facility will also need to assist the patient in arranging follow up appointment in our office and any transportation needs.  MAKE SURE YOU:  Understand these instructions.  Get help right away if you are not doing well or get worse.    Pick up stool softner and laxative for home use following surgery while on pain medications. Continue to use ice for pain and swelling after surgery. Do not use any lotions or creams on the incision until instructed by your surgeon.

## 2018-11-03 NOTE — Evaluation (Signed)
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 PMH: R THR, ant approach 01/26/18, sleep apnea, anxiety depression, diabetes, obesity.  Clinical Impression  Prior to hospital admission, pt was independent.  Pt lives with his wife at a two story house and level entry.  Currently pt is SBA with transfers to ambulation but requires multiple standing breaks (less than 30s) during walking (258feet) with RW due to increased pain at R anterior hip incision. Min A for bed mobility back to bed with RLE positioning. Pain is the limiting factor for the session and nurse communicated with pt's current pain level and pt's request for pain meds. Vc's provided through the session for RW management. Pt would benefit from skilled PT to address noted impairments and functional limitations (see below for any additional details).  Upon hospital discharge, recommend pt discharge to [place] to improve strength, muscle endurance, balance for functional mobility and ADLs.     Follow Up Recommendations Home health PT    Equipment Recommendations  Rolling walker with 5" wheels    Recommendations for Other Services       Precautions / Restrictions Precautions Precautions: Anterior Hip Precaution Booklet Issued: Yes (comment) Restrictions Weight Bearing Restrictions: Yes RLE Weight Bearing: Weight bearing as tolerated      Mobility  Bed Mobility Overal bed mobility: Needs Assistance Bed Mobility: Sit to Supine       Sit to supine: Min assist   General bed mobility comments: Min A for R LE positioning back to bed. Increased pain noted to 7/10  Transfers Overall transfer level: Needs assistance Equipment used: Rolling walker (2 wheeled) Transfers: Sit to/from Stand Sit  to Stand: Supervision         General transfer comment: SBA for standing from recliner and vc's for hand and foot placement with RW.  Ambulation/Gait Ambulation/Gait assistance: Supervision Gait Distance (Feet): 200 Feet(x 3 standing break less than 30s each due to pain) Assistive device: Rolling walker (2 wheeled) Gait Pattern/deviations: Step-through pattern;Decreased step length - right Gait velocity: decrease   General Gait Details: Able to use step through gait with RW; vc's for hand placement and body positioning inside of the RW.  Stairs            Wheelchair Mobility    Modified Rankin (Stroke Patients Only)       Balance Overall balance assessment: Modified Independent Sitting-balance support: No upper extremity supported;Feet supported Sitting balance-Leahy Scale: Good Sitting balance - Comments: steady sitting and reaching within BOS   Standing balance support: No upper extremity supported Standing balance-Leahy Scale: Good Standing balance comment: steady standing and reaching within BOS                             Pertinent Vitals/Pain Pain Assessment: 0-10 Pain Score: 7  Pain Location: R hip incision Pain Descriptors / Indicators: Burning;Cramping Pain Intervention(s): Limited activity within patient's tolerance;Monitored during session;Repositioned;Patient requesting pain meds-RN notified    Home Living Family/patient expects to be discharged to:: Private residence Living Arrangements: Spouse/significant other Available Help at Discharge: Family Type of Home: House Home Access: Level entry     Home Layout: Two level;Full bath on main level;Able to live on main level with bedroom/bathroom Home Equipment: Other (comment);Cane - single point(Pt not  able to remember if he has a RW at home)      Prior Function Level of Independence: Independent               Hand Dominance   Dominant Hand: Right    Extremity/Trunk  Assessment   Upper Extremity Assessment Upper Extremity Assessment: Overall WFL for tasks assessed(at least 4/5 grossly BUE)    Lower Extremity Assessment Lower Extremity Assessment: Overall WFL for tasks assessed(at least 3/5 with R LE knee extension/flexion; at least 4/5 with BLE dorsiflexion/plantar flexion; L knee flexion/extension; L hip flexion. R hip MMT avoided due to pain)    Cervical / Trunk Assessment Cervical / Trunk Assessment: Normal  Communication   Communication: No difficulties  Cognition Arousal/Alertness: Awake/alert Behavior During Therapy: WFL for tasks assessed/performed Overall Cognitive Status: Within Functional Limits for tasks assessed                                        General Comments General comments (skin integrity, edema, etc.): Dressing/hemo drainage intact at beginning /end of the session.    Exercises     Assessment/Plan    PT Assessment Patient needs continued PT services  PT Problem List Decreased strength;Decreased range of motion;Decreased activity tolerance;Decreased balance;Decreased mobility;Decreased knowledge of use of DME       PT Treatment Interventions DME instruction;Gait training;Functional mobility training;Therapeutic activities;Therapeutic exercise;Balance training    PT Goals (Current goals can be found in the Care Plan section)  Acute Rehab PT Goals Patient Stated Goal: to go home PT Goal Formulation: With patient Time For Goal Achievement: 11/17/18 Potential to Achieve Goals: Good    Frequency BID   Barriers to discharge        Co-evaluation               AM-PAC PT "6 Clicks" Mobility  Outcome Measure Help needed turning from your back to your side while in a flat bed without using bedrails?: A Little Help needed moving from lying on your back to sitting on the side of a flat bed without using bedrails?: A Little Help needed moving to and from a bed to a chair (including a  wheelchair)?: A Little Help needed standing up from a chair using your arms (e.g., wheelchair or bedside chair)?: A Little Help needed to walk in hospital room?: A Little Help needed climbing 3-5 steps with a railing? : A Lot 6 Click Score: 17    End of Session Equipment Utilized During Treatment: Gait belt Activity Tolerance: Patient limited by pain Patient left: in bed;with call bell/phone within reach;with bed alarm set;with SCD's reapplied(BLE heels floating off from pillows) Nurse Communication: Mobility status;Weight bearing status;Precautions PT Visit Diagnosis: Unsteadiness on feet (R26.81);Other abnormalities of gait and mobility (R26.89);Difficulty in walking, not elsewhere classified (R26.2);Pain Pain - Right/Left: Right Pain - part of body: Hip    Time: [id number] PT Time Calculation (min) (ACUTE ONLY): 50 min   Charges:             Sherrilyn Rist, SPT 11/03/2018, 11:36 AM

## 2018-11-03 NOTE — TOC Initial Note (Addendum)
Transition of Care [facility]) - Initial/Assessment Note    Patient Details  Name: Lawrnce [name] MRN: [id number] Date of Birth: 10-Nov-1950  [facility] CM/SW Contact:    Mario Hilt, RN Phone Number: 11/03/2018, 3:00 PM  Clinical Narrative:                 Spoke with the wife Mario [name], the patient was sound asleep  She stated that the patient does have a RW and a cane at home and does not need additional DME He has used [facility] PT before and they would like to use the same company and thinks it may be [facility] I called Mario [name] with [facility] and inquired The patient would also like to use the same therapist, the patient was in fact opened previously with [facility], I notified Mario [name] with [facility] that the patient would like to have again and use the same therapist The patient is up to date with his PCP sees Dr Timmothy Euler medication at [facility] and can afford medication, requested the price of Lovenox for the patient and will notify the patient once obtained    Expected Discharge Plan: [place] Barriers to Discharge: Continued Medical Work up   Patient Goals and CMS Choice Patient states their goals for this hospitalization and ongoing recovery are:: Go home CMS Medicare.gov Compare Post Acute Care list provided to:: Patient Choice offered to / list presented to : Patient  Expected Discharge Plan and Services Expected Discharge Plan: [place]   Discharge Planning Services: CM Consult Post Acute Care Choice: [place] arrangements for the past 2 months: Single Family Home                 DME Arranged: N/A         HH Arranged: PT HH Agency: [facility] ([place]) Date HH Agency Contacted: 11/03/18 Time Bureau: [id number] Representative spoke with at [facility]: Mario [name]  Prior Living Arrangements/Services Living arrangements for the past 2 months: [place] with:: Spouse Patient language and need for  interpreter reviewed:: No Do you feel safe going back to the place where you live?: Yes      Need for Family Participation in Patient Care: No (Comment) Care giver support system in place?: Yes (comment) Current home services: DME(RW and cane) Criminal Activity/Legal Involvement Pertinent to Current Situation/Hospitalization: No - Comment as needed  Activities of Daily Living Home Assistive Devices/Equipment: Walker (specify type) ADL Screening (condition at time of admission) Patient's cognitive ability adequate to safely complete daily activities?: Yes Is the patient deaf or have difficulty hearing?: No Does the patient have difficulty seeing, even when wearing glasses/contacts?: No Does the patient have difficulty concentrating, remembering, or making decisions?: No Patient able to express need for assistance with ADLs?: Yes Does the patient have difficulty dressing or bathing?: No Independently performs ADLs?: Yes (appropriate for developmental age) Does the patient have difficulty walking or climbing stairs?: No Weakness of Legs: Right Weakness of Arms/Hands: None  Permission Sought/Granted   Permission granted to share information with : Yes, Verbal Permission Granted              Emotional Assessment Appearance:: Appears stated age Attitude/Demeanor/Rapport: Engaged Affect (typically observed): Appropriate, Calm Orientation: : Oriented to Self, Oriented to Place, Oriented to  Time, Oriented to Situation Alcohol / Substance Use: Not Applicable Psych Involvement: No (comment)  Admission diagnosis:  HETEROTOPIC OSSIFICATION Patient Active Problem List  Diagnosis Date Noted  . 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 Status post total hip replacement, right 01/26/2018   PCP:  Baxter Hire, MD Pharmacy:   [facility] [address] [place] - [place] [address] [place] [address] Phone: [phone] Fax: [phone]     Social  Determinants of Health (SDOH) Interventions    Readmission Risk Interventions No flowsheet data found.

## 2018-11-03 NOTE — Progress Notes (Signed)
   Subjective: 1 Day Post-Op Procedure(s) (LRB): TOTAL HIP REVISION WITH EXCISION OF HETEROTOPIC OSSIFICATION (Right) Patient reports pain as mild.  Anxiety improved with Ativan. Patient is well, and has had no acute complaints or problems Denies any CP, SOB, ABD pain. We will continue therapy today.  Plan is to go Home after hospital stay.  Objective: Vital signs in last 24 hours: Temp:  [96.5 F (35.8 C)-98.8 F (37.1 C)] 97.4 F (36.3 C) (08/26 0729) Pulse Rate:  [55-81] 55 (08/26 0729) Resp:  [14-35] 18 (08/26 0357) BP: (108-167)/(61-110) 108/61 (08/26 0729) SpO2:  [95 %-100 %] 100 % (08/26 0729)  Intake/Output from previous day: 08/25 0701 - 08/26 0700 In: 1902.5 [P.O.:600; I.V.:1103.2; IV Piggyback:199.3] Out: 3300 [Urine:3000; Blood:300] Intake/Output this shift: No intake/output data recorded.  Recent Labs    11/02/18 1216 11/03/18 0432  HGB 13.6 12.6*   Recent Labs    11/02/18 1216 11/03/18 0432  WBC 10.1 10.8*  RBC 4.58 4.21*  HCT 41.6 38.2*  PLT 173 176   Recent Labs    11/02/18 1216 11/03/18 0432  NA  --  137  K  --  5.1  CL  --  104  CO2  --  26  BUN  --  9  CREATININE 0.79 0.80  GLUCOSE  --  147*  CALCIUM  --  8.6*   No results for input(s): LABPT, INR in the last 72 hours.  EXAM General - Patient is Alert, Appropriate and Oriented Extremity - Neurovascular intact Sensation intact distally Intact pulses distally Dorsiflexion/Plantar flexion intact No cellulitis present Compartment soft Dressing - dressing C/D/I and no drainage Praveena intact with no drainage Motor Function - intact, moving foot and toes well on exam.   Past Medical History:  Diagnosis Date  . Anginal pain ([place])   . Arthritis   . Back pain    lower  . Claustrophobia   . Depression   . Diabetes mellitus without complication ([place])   . Dyspnea   . Elevated lipids   . Elevated lipids   . Environmental and seasonal allergies   . Fatty liver   . GERD  (gastroesophageal reflux disease)   . Glaucoma   . Hepatitis    type A  . HH (hiatus hernia)   . Hip pain Right  . History of kidney stones   . Knee pain    right  . Panic attacks   . Sleep apnea     Assessment/Plan:   1 Day Post-Op Procedure(s) (LRB): TOTAL HIP REVISION WITH EXCISION OF HETEROTOPIC OSSIFICATION (Right) Active Problems:   S/P revision of total hip  Estimated body mass index is 42 kg/m as calculated from the following:   Height as of this encounter: 5\' 9"  (1.753 m).   Weight as of this encounter: 129 kg. Advance diet Up with therapy  Needs bowel movement Labs stable Vital signs are stable Pain well controlled Anxiety controlled with Ativan Patient scheduled for radiation therapy x1 for heterotopic ossification today.  We will try to give Ativan prior to transfer to help with patient's anxiety Care management to assist with discharge.  DVT Prophylaxis - Lovenox, TED hose and SCDs Weight-Bearing as tolerated to right leg   T. Rachelle Hora, PA-C [place] 11/03/2018, 8:14 AM

## 2018-11-03 NOTE — Evaluation (Signed)
Occupational Therapy Evaluation Patient Details Name: Mario [name] MRN: [id number] DOB: August 10, 1950 Today's Date: 11/03/2018    History of Present Illness Pt previously admitted for acute hospitalization s/p R THR revision with excision of heterotopic ossification, ant approach 11/02/18, WBAT. PMH: R THR, ant approach 01/26/18, sleep apnea, anxiety depression, diabetes, obesity. Now dx: heterotopic ossification and   Clinical Impression   Mr. Javadi was seen for OT evaluation this date, POD#1 from above surgery. Pt was independent in all ADLs prior to surgery. Pt is eager to return to PLOF with less pain and improved safety and independence. Pt currently requires minimal assist for LB dressing and bathing while in seated position due to pain and limited AROM of R hip. Pt instructed in self care skills, falls prevention strategies, home/routines modifications, DME/AE for LB bathing and dressing tasks, and compression stocking mgt strategies. Handout Provided. Pt would benefit from additional instruction in self care skills and techniques with or without assistive devices to support recall and carryover prior to discharge. Recommend HHOT upon discharge.      Follow Up Recommendations  Home health OT    Equipment Recommendations  3 in 1 bedside commode([facility])    Recommendations for Other Services       Precautions / Restrictions Precautions Precautions: Anterior Hip Precaution Booklet Issued: Yes (comment) Restrictions Weight Bearing Restrictions: Yes RLE Weight Bearing: Weight bearing as tolerated      Mobility Bed Mobility Overal bed mobility: Needs Assistance Bed Mobility: Supine to Sit     Supine to sit: HOB elevated;Supervision Sit to supine: Min assist   General bed mobility comments: HOB elevated. Increased time/effort. Min VC's for use of bed rails for trunk elevation.  Transfers Overall transfer level: Needs assistance Equipment used: Rolling walker (2  wheeled) Transfers: Sit to/from Stand Sit to Stand: Supervision         General transfer comment: SBA for standing from raised bed. Pt required assist for mgt of lines and leads as well as vc's for hand and foot placement with RW.    Balance Overall balance assessment: Modified Independent Sitting-balance support: No upper extremity supported;Feet supported Sitting balance-Leahy Scale: Good Sitting balance - Comments: steady sitting and reaching within BOS. Good static and dynamic seated balance during functional activity.   Standing balance support: No upper extremity supported Standing balance-Leahy Scale: Good Standing balance comment: steady standing and reaching within BOS                           ADL either performed or assessed with clinical judgement   ADL Overall ADL's : Needs assistance/impaired                                       General ADL Comments: Mod-mod assist for LB ADL mgt in seated position due to increased pain and decreased ROM of the R hip. Pt able to return demo use of Sock-aid with min asssit to reposition sock and VCs for technique. Pt supervision assist for functional mobility.     Vision Baseline Vision/History: Wears glasses;Glaucoma Wears Glasses: Reading only Patient Visual Report: No change from baseline       Perception     Praxis      Pertinent Vitals/Pain Pain Assessment: 0-10 Pain Score: 4  Pain Location: R hip incision Pain Descriptors / Indicators: Sore;Aching;Guarding;Grimacing Pain Intervention(s): Limited activity within patient's  tolerance;Monitored during session;Repositioned     Hand Dominance Right   Extremity/Trunk Assessment Upper Extremity Assessment Upper Extremity Assessment: Overall WFL for tasks assessed   Lower Extremity Assessment Lower Extremity Assessment: RLE deficits/detail;Defer to PT evaluation RLE Deficits / Details: s/p THA revision. RLE: Unable to fully assess due to  pain RLE Coordination: decreased gross motor;decreased fine motor   Cervical / Trunk Assessment Cervical / Trunk Assessment: Normal   Communication Communication Communication: No difficulties   Cognition Arousal/Alertness: Awake/alert Behavior During Therapy: WFL for tasks assessed/performed Overall Cognitive Status: Within Functional Limits for tasks assessed                                     General Comments  Dressing and wound vac intact at start/end of session.    Exercises Other Exercises Other Exercises: Pt educated in falls prevantion strategies, safe use of AE for functional mobility and LB ADL mgt, compression stocking mgt, and safe transfer strategies on this date. Handout provided. Would benefit from additional reinforcement for improved carryover and recall of eduation. Pt interested in use of a toilet aid for improved independence during toileting.   Shoulder Instructions      Home Living Family/patient expects to be discharged to:: Private residence Living Arrangements: Spouse/significant other Available Help at Discharge: Family Type of Home: House Home Access: Level entry     Home Layout: Two level;Full bath on main level;Able to live on main level with bedroom/bathroom     Bathroom Shower/Tub: Tub/shower unit   Bathroom Toilet: Handicapped height     Home Equipment: Grab bars - tub/shower   Additional Comments: Pt unable to remember if he still has any AE from prior sx.      Prior Functioning/Environment Level of Independence: Independent        Comments: Indep with ADLs, wife assists with driving/IADL mgt.        OT Problem List: Decreased strength;Decreased coordination;Pain;Decreased range of motion;Decreased safety awareness;Decreased activity tolerance;Decreased knowledge of use of DME or AE;Obesity;Impaired balance (sitting and/or standing);Decreased knowledge of precautions      OT Treatment/Interventions:  Self-care/ADL training;Balance training;Therapeutic exercise;Therapeutic activities;Energy conservation;DME and/or AE instruction;Patient/family education    OT Goals(Current goals can be found in the care plan section) Acute Rehab OT Goals Patient Stated Goal: to go home OT Goal Formulation: With patient Time For Goal Achievement: 11/17/18 Potential to Achieve Goals: Good ADL Goals Pt Will Perform Lower Body Dressing: with adaptive equipment;sit to/from stand;with supervision(With LRAD PRN for improved safety and functional independence) Pt Will Transfer to Toilet: ambulating;with modified independence(With LRAD PRN for improved safety and functional independence) Pt Will Perform Toileting - Clothing Manipulation and hygiene: sit to/from stand;with adaptive equipment;with modified independence(With LRAD PRN for improved safety and functional independence)  OT Frequency: Min 1X/week   Barriers to D/C: Inaccessible home environment          Co-evaluation              AM-PAC OT "6 Clicks" Daily Activity     Outcome Measure Help from another person eating meals?: None Help from another person taking care of personal grooming?: None Help from another person toileting, which includes using toliet, bedpan, or urinal?: A Little Help from another person bathing (including washing, rinsing, drying)?: A Little Help from another person to put on and taking off regular upper body clothing?: A Little Help from another person to put on and  taking off regular lower body clothing?: A Lot 6 Click Score: 19   End of Session Equipment Utilized During Treatment: Gait belt;Rolling walker  Activity Tolerance: Patient tolerated treatment well Patient left: in chair;with call bell/phone within reach;with chair alarm set;with SCD's reapplied  OT Visit Diagnosis: Other abnormalities of gait and mobility (R26.89);History of falling (Z91.81);Pain Pain - Right/Left: Right Pain - part of body: Hip                 Time: [id number] OT Time Calculation (min): 35 min Charges:  OT General Charges $OT Visit: 1 Visit OT Evaluation $OT Eval Low Complexity: 1 Low OT Treatments $Self Care/Home Management : 23-37 mins  Shara Blazing, M.S., OTR/L Ascom: [phone] 11/03/18, 12:14 PM

## 2018-11-03 NOTE — Discharge Summary (Signed)
Physician Discharge Summary  Patient ID: Mario [name] MRN: [id number] DOB/AGE: 1950/12/04 68 y.o.  Admit date: 11/02/2018 Discharge date: 11/05/2018 Admission Diagnoses:  [place]   Discharge Diagnoses: Patient Active Problem List   Diagnosis Date Noted  . 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 Status post total hip replacement, right 01/26/2018    Past Medical History:  Diagnosis Date  . Anginal pain ([place])   . Arthritis   . Back pain    lower  . Claustrophobia   . Depression   . Diabetes mellitus without complication ([place])   . Dyspnea   . Elevated lipids   . Elevated lipids   . Environmental and seasonal allergies   . Fatty liver   . GERD (gastroesophageal reflux disease)   . Glaucoma   . Hepatitis    type A  . HH (hiatus hernia)   . Hip pain Right  . History of kidney stones   . Knee pain    right  . Panic attacks   . Sleep apnea      Transfusion: none   Consultants (if any):   Discharged Condition: Improved  Hospital Course: Mario [name] is an 68 y.o. male who was admitted 11/02/2018 with a diagnosis of heterotopic ossification right total hip and went to the operating room on 11/02/2018 and underwent the above named procedures.    Surgeries: Procedure(s): TOTAL HIP REVISION WITH EXCISION OF HETEROTOPIC OSSIFICATION on 11/02/2018 Patient tolerated the surgery well. Taken to PACU where she was stabilized and then transferred to the orthopedic floor.  Started on Lovenox 40 mg q 12 hrs. Foot pumps applied bilaterally at 80 mm. Heels elevated on bed with rolled towels. No evidence of DVT. Negative Homan. Physical therapy started on day #1 for gait training and transfer. OT started day #1 for ADL and assisted devices.  Patient's foley was d/c on day #1. Patient's IV  was d/c on day #2.  On post op day #3 patient was stable and ready for discharge to home with home health PT.  Implants: Medacta 52 mm DM liner, 28 mm XL metal head  He was given  perioperative antibiotics:  Anti-infectives (From admission, onward)   Start     Dose/Rate Route Frequency Ordered Stop   11/02/18 1300  ceFAZolin (ANCEF) IVPB 2g/100 mL premix     2 g 200 mL/hr over 30 Minutes Intravenous Every 6 hours 11/02/18 1114 11/03/18 0659   11/02/18 0200  ceFAZolin (ANCEF) 3 g in dextrose 5 % 50 mL IVPB     3 g 100 mL/hr over 30 Minutes Intravenous  Once 11/02/18 0150 11/02/18 0705    .  He was given sequential compression devices, early ambulation, and Lovenox, teds for DVT prophylaxis.  He benefited maximally from the hospital stay and there were no complications.    Recent vital signs:  Vitals:   11/04/18 2332 11/04/18 2337  BP: (!) 100/33 131/64  Pulse: 86 79  Resp: 16   Temp: 98.1 F (36.7 C)   SpO2: 96%     Recent laboratory studies:  Lab Results  Component Value Date   HGB 12.6 (L) 11/03/2018   HGB 13.6 11/02/2018   HGB 14.8 10/26/2018   Lab Results  Component Value Date   WBC 10.8 (H) 11/03/2018   PLT 176 11/03/2018   Lab Results  Component Value Date   INR 1.1 10/26/2018   Lab Results  Component Value Date   NA 137 11/03/2018   K 5.1 11/03/2018  CL 104 11/03/2018   CO2 26 11/03/2018   BUN 9 11/03/2018   CREATININE 0.80 11/03/2018   GLUCOSE 147 (H) 11/03/2018    Discharge Medications:   Allergies as of 11/05/2018      Reactions   Levaquin [levofloxacin] Itching   Red lines      Medication List    TAKE these medications   CALCIUM-MAGNESIUM-ZINC PO Take 1 tablet by mouth 2 (two) times a week.   diazepam 5 MG tablet Commonly known as: VALIUM Take 1 tablet (5 mg total) by mouth every 6 (six) hours as needed for anxiety.   enoxaparin 40 MG/0.4ML injection Commonly known as: Lovenox Inject 0.4 mLs (40 mg total) into the skin daily for 14 days.   glimepiride 4 MG tablet Commonly known as: AMARYL Take 4 mg by mouth at bedtime.   HM Super Vitamin B12 2500 MCG Chew Generic drug: Cyanocobalamin Chew 2,500 mcg by  mouth 2 (two) times a week.   LORazepam 0.5 MG tablet Commonly known as: ATIVAN Take 0.5 mg by mouth every 8 (eight) hours.   Magnesium 400 MG Caps Take 400 mg by mouth 2 (two) times a week.   metFORMIN 1000 MG tablet Commonly known as: GLUCOPHAGE Take 1,000 mg by mouth 2 (two) times daily with a meal.   oxyCODONE 5 MG immediate release tablet Commonly known as: Oxy IR/ROXICODONE Take 1-2 tablets (5-10 mg total) by mouth every 4 (four) hours as needed for moderate pain (pain score 4-6).   pravastatin 40 MG tablet Commonly known as: PRAVACHOL Take 40 mg by mouth daily.   traMADol 50 MG tablet Commonly known as: ULTRAM Take 1 tablet (50 mg total) by mouth every 6 (six) hours.   traZODone 100 MG tablet Commonly known as: DESYREL Take 100 mg by mouth at bedtime.   TYLENOL 500 MG tablet Generic drug: acetaminophen Take 500 mg by mouth every 6 (six) hours as needed (pain).   VITAMIN D3 PO Take 1,000 Units by mouth 2 (two) times a week.   zolpidem 10 MG tablet Commonly known as: AMBIEN Take 10 mg by mouth at bedtime as needed for sleep.       Diagnostic Studies: Dg Hip Operative Unilat W Or W/o Pelvis Right  Result Date: 11/02/2018 CLINICAL DATA:  68 year old male undergoing right hip revision, excision of heterotopic ossification. EXAM: OPERATIVE right HIP (WITH PELVIS IF PERFORMED) 2 VIEWS TECHNIQUE: Fluoroscopic spot image(s) were submitted for interpretation post-operatively. COMPARISON:  Pelvis CT 09/23/2018. FLUOROSCOPY TIME:  0 minutes 6 seconds FINDINGS: 2 intraoperative fluoroscopic spot views of the right hip. Bipolar hip arthroplasty demonstrated. Hardware components appear normally aligned. Heterotopic ossification again noted about the lateral acetabulum on the initial view. IMPRESSION: Intraoperative images of right bipolar hip arthroplasty with no adverse features. Electronically Signed   By: Genevie Ann M.D.   On: 11/02/2018 10:22   Dg Hip Unilat W Or W/o Pelvis  2-3 Views Right  Result Date: 11/02/2018 CLINICAL DATA:  68 year old male status post right hip revision. EXAM: DG HIP (WITH OR WITHOUT PELVIS) 2-3V RIGHT COMPARISON:  CT pelvis 09/23/2018. FINDINGS: AP and cross-table lateral views at 0958 hours. Bipolar hip arthroplasty is in place and appears normally located. Heterotopic ossification redemonstrated about the lateral right hip. No adverse hardware features. No acute osseous abnormality identified. Skin staples overlie the right hip. Partially visible lumbar spine hardware. 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 Electronically Signed   By: Genevie Ann M.D.   On:  11/02/2018 10:21    Disposition:     Follow-up Information    Duanne Guess, PA-C Follow up in 2 week(s).   Specialties: Orthopedic Surgery, Emergency Medicine Contact information: [place] [address] [phone]            Signed: Feliberto Gottron 11/05/2018, 8:12 AM

## 2018-11-03 NOTE — Progress Notes (Signed)
Physical Therapy Treatment Patient Details Name: Mario [name] MRN: [id number] DOB: Jul 21, 1950 Today's Date: 11/03/2018    History of Present Illness Pt previously admitted for acute hospitalization s/p R THR revision with excision of heterotopic ossification, ant approach 11/02/18, WBAT. PMH: R THR, ant approach 01/26/18, sleep apnea, anxiety depression, diabetes, obesity.    PT Comments    Pt supine at bed when PT came in and agreeable to PT session. Pt's pain increased compared to the previous session to 8/10 constant but pt able to tolerate the session. Exercise program reviewed and pt able to demonstrate good understanding with instructions and good muscle activation. Supervision and MinA for RLE out of bed. Supervision with ambulation 100 feet without resting breaks with RW. Will progress ambulation distance, strength and balance in later session.    Follow Up Recommendations  Home health PT     Equipment Recommendations  Rolling walker with 5" wheels    Recommendations for Other Services       Precautions / Restrictions Precautions Precautions: Anterior Hip Precaution Booklet Issued: Yes (comment) Restrictions Weight Bearing Restrictions: Yes RLE Weight Bearing: Weight bearing as tolerated    Mobility  Bed Mobility Overal bed mobility: Needs Assistance Bed Mobility: Supine to Sit     Supine to sit: HOB elevated;Supervision;Min assist     General bed mobility comments: Increase time and effort. MinA for LE positioning out of bed  Transfers Overall transfer level: Needs assistance Equipment used: Rolling walker (2 wheeled) Transfers: Sit to/from Stand Sit to Stand: Supervision         General transfer comment: Vc's for hand positioning with RW. Pt able to stand up but noted increased pain at R hip incision site  Ambulation/Gait Ambulation/Gait assistance: Supervision Gait Distance (Feet): 100 Feet Assistive device: Rolling walker (2 wheeled) Gait  Pattern/deviations: Step-through pattern;Decreased step length - right Gait velocity: decrease   General Gait Details: Able to maintain good hand and body positioning with RW management; distance limited due to muscle fatigue per patient.   Stairs             Wheelchair Mobility    Modified Rankin (Stroke Patients Only)       Balance Overall balance assessment: Modified Independent Sitting-balance support: No upper extremity supported;Feet supported Sitting balance-Leahy Scale: Good Sitting balance - Comments: steady sitting and reaching within BOS. Good static and dynamic seated balance during functional activity.   Standing balance support: No upper extremity supported Standing balance-Leahy Scale: Good Standing balance comment: steady standing and reaching within BOS                            Cognition Arousal/Alertness: Awake/alert Behavior During Therapy: WFL for tasks assessed/performed Overall Cognitive Status: Within Functional Limits for tasks assessed                                        Exercises Total Joint Exercises Ankle Circles/Pumps: AROM;Strengthening;Both;10 reps;Supine Quad Sets: AROM;Strengthening;Both;10 reps;Supine Gluteal Sets: AROM;Strengthening;Both;10 reps;Supine Towel Squeeze: AROM;Strengthening;Both;10 reps;Supine Short Arc Quad: AROM;Strengthening;Right;10 reps;Supine Heel Slides: AROM;Strengthening;Right;10 reps;Supine Hip ABduction/ADduction: AROM;Strengthening;Right;10 reps;Supine Straight Leg Raises: AROM;Strengthening;Right;10 reps;Supine Long Arc Quad: AROM;Strengthening;Right;10 reps;Seated General Exercises - Lower Extremity Hip Flexion/Marching: AROM;Strengthening;Right;10 reps;Seated    General Comments General comments (skin integrity, edema, etc.): Dressing and wound vac intact at start/end of session.      Pertinent Vitals/Pain Pain Assessment:  0-10 Pain Score: 8  Pain Location: R hip  incision Pain Descriptors / Indicators: Sore;Aching;Guarding;Grimacing Pain Intervention(s): Limited activity within patient's tolerance;Monitored during session;Repositioned(Pt refused pain medication and wanted to wean of pain pills)    Home Living                      Prior Function            PT Goals (current goals can now be found in the care plan section) Acute Rehab PT Goals Patient Stated Goal: to go home PT Goal Formulation: With patient Time For Goal Achievement: 11/17/18 Potential to Achieve Goals: Good Progress towards PT goals: Progressing toward goals    Frequency    BID      PT Plan      Co-evaluation              AM-PAC PT "6 Clicks" Mobility   Outcome Measure  Help needed turning from your back to your side while in a flat bed without using bedrails?: A Little Help needed moving from lying on your back to sitting on the side of a flat bed without using bedrails?: A Little Help needed moving to and from a bed to a chair (including a wheelchair)?: A Little Help needed standing up from a chair using your arms (e.g., wheelchair or bedside chair)?: A Little Help needed to walk in hospital room?: A Little Help needed climbing 3-5 steps with a railing? : A Lot 6 Click Score: 17    End of Session Equipment Utilized During Treatment: Gait belt Activity Tolerance: Patient limited by pain Patient left: in chair;with call bell/phone within reach;with SCD's reapplied(BLE heels off from pillow on R LE and rolled towel at L LE; Chair alarm is not activated and nurse notified. PT returned to the room transfered pt from chair to bed with bed alarm on BLE heels off from pillow on R LE and rolled towel at L LE.) Nurse Communication: Mobility status;Weight bearing status;Precautions PT Visit Diagnosis: Unsteadiness on feet (R26.81);Other abnormalities of gait and mobility (R26.89);Difficulty in walking, not elsewhere classified (R26.2);Pain Pain -  Right/Left: Right Pain - part of body: Hip     Time: [id number] PT Time Calculation (min) (ACUTE ONLY): 31 min  Charges:                          Sherrilyn Rist, SPT 11/03/2018, 4:13 PM

## 2018-11-04 LAB — GLUCOSE, CAPILLARY
Glucose-Capillary: 97 mg/dL (ref 70–99)
Glucose-Capillary: 103 mg/dL — ABNORMAL HIGH (ref 70–99)
Glucose-Capillary: 113 mg/dL — ABNORMAL HIGH (ref 70–99)
Glucose-Capillary: 115 mg/dL — ABNORMAL HIGH (ref 70–99)

## 2018-11-04 NOTE — Progress Notes (Signed)
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 PMH: R THR, ant approach 01/26/18, sleep apnea, anxiety depression, diabetes, obesity.   OT comments  Pt seen for OT treatment on this date. Upon arrival to room pt supine in bed wearing CPAP, appeared to be sleeping. Pt woke easily to verbal cues. Pt agreeable to OT tx, but stated he would like to remain in bed because he was having 7/10 hip pain and felt "sore" from prior therapy sessions this date. OT educated pt on use of toilet aid to promote independence and safety during peri-care. Pt had previously requested information on AE to support functional independence after using the bathroom. OT also proivded AE catalog and directed pt to self-care items to support toilet hygrine. Pt return verbalized understanding of education provided. Pt  Pt continues to benefit from skilled OT services to maximize return to PLOF and minimize risk of future falls, injury, caregiver burden, and readmission. Will continue to follow POC. Discharge recommendation remains appropriate.    Follow Up Recommendations  Home health OT    Equipment Recommendations  3 in 1 bedside commode([facility])    Recommendations for Other Services      Precautions / Restrictions Precautions Precautions: Anterior Hip Precaution Booklet Issued: No Restrictions Weight Bearing Restrictions: Yes RLE Weight Bearing: Weight bearing as tolerated       Mobility Bed Mobility Overal bed mobility: Needs Assistance Bed Mobility: Supine to Sit     Supine to sit: Supervision     General bed mobility comments: Increased time and effort and pt used bed railing for BUE support to get OOB.  Transfers Overall transfer level: Needs assistance Equipment used:  Rolling walker (2 wheeled) Transfers: Sit to/from Stand Sit to Stand: Supervision         General transfer comment: Vc's for hand positioning with RW. Increase pain when stand up but able to tolerate without loss of balance    Balance Overall balance assessment: Modified Independent Sitting-balance support: No upper extremity supported;Feet supported Sitting balance-Leahy Scale: Good Sitting balance - Comments: steady sitting and reaching within BOS. Good static and dynamic seated balance during functional activity.   Standing balance support: No upper extremity supported Standing balance-Leahy Scale: Good Standing balance comment: steady standing and reaching within BOS                           ADL either performed or assessed with clinical judgement   ADL Overall ADL's : Needs assistance/impaired                             Toileting- Clothing Manipulation and Hygiene: Set up;Minimal assistance;Sit to/from stand;With adaptive equipment Toileting - Clothing Manipulation Details (indicate cue type and reason): Pt educated in use of toilet aid to improve safety and independence during peri-care. Pt endorsed concerns over his wife needing to assist hime with toilet mgt and desire to be as independent as possible in this area. Pt left with AE catalog to review alternate options. Pt verbalized understanding of education provided.             Vision Baseline Vision/History: Wears glasses;Glaucoma Wears Glasses: Reading only Patient Visual Report: No change from baseline     Perception  Praxis      Cognition Arousal/Alertness: Awake/alert Behavior During Therapy: WFL for tasks assessed/performed Overall Cognitive Status: Within Functional Limits for tasks assessed                                          Exercises Total Joint Exercises  Other Exercises: Pt educated on use of toilet aid to promote improved safety and functional  independence during peri-care. OT provided visual demonstration of how to load wiping aid and provided AE catalog for rerpresentation of alternate options for peri-care mgt. Pt return verbalized understanding of education provided.   Shoulder Instructions       General Comments Pt states didn't want to take pain medication and nurse notified.    Pertinent Vitals/ Pain       Pain Assessment: 0-10 Pain Score: 7  Pain Location: R hip incision Pain Descriptors / Indicators: Sore;Aching;Guarding;Grimacing Pain Intervention(s): Limited activity within patient's tolerance;Monitored during session;Patient requesting pain meds-RN notified  Home Living                                          Prior Functioning/Environment              Frequency  Min 1X/week        Progress Toward Goals  OT Goals(current goals can now be found in the care plan section)  Progress towards OT goals: Progressing toward goals  Acute Rehab OT Goals Patient Stated Goal: to go home OT Goal Formulation: With patient Time For Goal Achievement: 11/17/18 Potential to Achieve Goals: Good  Plan Discharge plan remains appropriate;Frequency remains appropriate    Co-evaluation                 AM-PAC OT "6 Clicks" Daily Activity     Outcome Measure   Help from another person eating meals?: None Help from another person taking care of personal grooming?: None Help from another person toileting, which includes using toliet, bedpan, or urinal?: A Little Help from another person bathing (including washing, rinsing, drying)?: A Little Help from another person to put on and taking off regular upper body clothing?: A Little Help from another person to put on and taking off regular lower body clothing?: A Lot 6 Click Score: 19    End of Session    OT Visit Diagnosis: Other abnormalities of gait and mobility (R26.89);History of falling (Z91.81);Pain Pain - Right/Left: Right Pain -  part of body: Hip   Activity Tolerance Patient tolerated treatment well   Patient Left in bed;with call bell/phone within reach;with bed alarm set;with SCD's reapplied   Nurse Communication          Time: [id number] OT Time Calculation (min): 12 min  Charges: OT General Charges $OT Visit: 1 Visit OT Treatments $Self Care/Home Management : 8-22 mins  Shara Blazing, M.S., OTR/L Ascom: [phone] 11/04/18, 3:50 PM

## 2018-11-04 NOTE — Progress Notes (Signed)
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 PMH: R THR, ant approach 01/26/18, sleep apnea, anxiety depression, diabetes, obesity.    PT Comments    Pt supine at bed when PT came in and agreeable to PT session. Pt states pain at "15/10 with stiff" at R LE but after reviewed exercises, pt states pain at incision site as 8/10 and maintained throughout the session. Pt reports pain at L shoulder last night but didn't c/o today. SBA with RW ambulation 200 feet and noted reduced standing breaks and increased walking speed than previous session. Vc's provided for hand and body positioning with RW. Pt demonstrated good understanding but requires constant cuing to maintain positioning. Will progress ambulation, exercise, balance in later session.    Follow Up Recommendations  Home health PT     Equipment Recommendations  Rolling walker with 5" wheels    Recommendations for Other Services       Precautions / Restrictions Precautions Precautions: Anterior Hip Precaution Booklet Issued: Yes (comment) Restrictions Weight Bearing Restrictions: Yes RLE Weight Bearing: Weight bearing as tolerated    Mobility  Bed Mobility Overal bed mobility: Needs Assistance       Supine to sit: Supervision;Min assist     General bed mobility comments: increase time and effort MinA for L UE hand held for sidelying and sitting up.  Transfers Overall transfer level: Needs assistance Equipment used: Rolling walker (2 wheeled) Transfers: Sit to/from Stand Sit to Stand: Supervision         General transfer comment: Vc's for hand positioning with RW. Less pain noted when transfer sitting <> standing compared to previous PT  session.  Ambulation/Gait Ambulation/Gait assistance: Supervision Gait Distance (Feet): 200 Feet(1 standing breaks for 20s due to pain) Assistive device: Rolling walker (2 wheeled) Gait Pattern/deviations: Step-through pattern;Decreased step length - right Gait velocity: decrease   General Gait Details: Requires cuing for RW management and body positioning inside of the RW.   Stairs             Wheelchair Mobility    Modified Rankin (Stroke Patients Only)       Balance Overall balance assessment: Modified Independent Sitting-balance support: No upper extremity supported;Feet supported Sitting balance-Leahy Scale: Good Sitting balance - Comments: steady sitting and reaching within BOS. Good static and dynamic seated balance during functional activity.   Standing balance support: No upper extremity supported Standing balance-Leahy Scale: Good Standing balance comment: steady standing and reaching within BOS                            Cognition Arousal/Alertness: Awake/alert Behavior During Therapy: WFL for tasks assessed/performed Overall Cognitive Status: Within Functional Limits for tasks assessed                                        Exercises Total Joint Exercises Ankle Circles/Pumps: AROM;Strengthening;Both;10 reps;Supine Short Arc Quad: AROM;Strengthening;Right;10 reps;Supine Hip ABduction/ADduction: AROM;Strengthening;Right;10 reps;Supine Straight Leg Raises: AROM;Strengthening;Right;10 reps;Supine    General Comments General comments (skin integrity, edema, etc.): Dressing and wound vac intact at start/end of session. Mild drainage noted and nurse communicated      Pertinent Vitals/Pain Pain Assessment: 0-10 Pain  Score: 8  Pain Location: R hip incision Pain Descriptors / Indicators: Sore;Aching;Guarding;Grimacing Pain Intervention(s): Limited activity within patient's tolerance;Monitored during session;Repositioned(Pt  states didn't want to take pain medication and nurse notified.)    Home Living                      Prior Function            PT Goals (current goals can now be found in the care plan section) Acute Rehab PT Goals Patient Stated Goal: to go home PT Goal Formulation: With patient Time For Goal Achievement: 11/17/18 Potential to Achieve Goals: Good Progress towards PT goals: Progressing toward goals    Frequency    BID      PT Plan Current plan remains appropriate    Co-evaluation              AM-PAC PT "6 Clicks" Mobility   Outcome Measure  Help needed turning from your back to your side while in a flat bed without using bedrails?: A Little Help needed moving from lying on your back to sitting on the side of a flat bed without using bedrails?: A Little Help needed moving to and from a bed to a chair (including a wheelchair)?: A Little Help needed standing up from a chair using your arms (e.g., wheelchair or bedside chair)?: A Little Help needed to walk in hospital room?: A Little Help needed climbing 3-5 steps with a railing? : A Lot 6 Click Score: 17    End of Session Equipment Utilized During Treatment: Gait belt Activity Tolerance: Patient limited by pain Patient left: in chair;with call bell/phone within reach;with SCD's reapplied;with chair alarm set(BLE heels off from pillow on R LE and rolled towel at L LE) Nurse Communication: Mobility status;Weight bearing status;Precautions PT Visit Diagnosis: Unsteadiness on feet (R26.81);Other abnormalities of gait and mobility (R26.89);Difficulty in walking, not elsewhere classified (R26.2);Pain Pain - Right/Left: Right Pain - part of body: Hip     Time: 0835-0900 PT Time Calculation (min) (ACUTE ONLY): 25 min  Charges:                          Sherrilyn Rist , SPT 11/04/2018, 9:09 AM

## 2018-11-04 NOTE — Progress Notes (Signed)
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 PMH: R THR, ant approach 01/26/18, sleep apnea, anxiety depression, diabetes, obesity.    PT Comments    Pt supine in bed when PT came in and agreeable to PT session. Pt c/o of pain throughout the session as constant 8-9/10 but refused pain medication. SBA to CGA with all mobility tested today. Ambulated stairs x 4 without loss of balance.. Exercise reviewed and pt demonstrated good muscle control and activation. Will progress muscle strength, endurance, and balance in later session.  Follow Up Recommendations  Home health PT     Equipment Recommendations  Rolling walker with 5" wheels    Recommendations for Other Services       Precautions / Restrictions Precautions Precautions: Anterior Hip Precaution Booklet Issued: Yes (comment) Restrictions Weight Bearing Restrictions: Yes RLE Weight Bearing: Weight bearing as tolerated    Mobility  Bed Mobility Overal bed mobility: Needs Assistance Bed Mobility: Supine to Sit     Supine to sit: Supervision     General bed mobility comments: Increased time and effort and pt used bed railing for BUE support to get OOB.  Transfers Overall transfer level: Needs assistance Equipment used: Rolling walker (2 wheeled) Transfers: Sit to/from Stand Sit to Stand: Supervision         General transfer comment: Vc's for hand positioning with RW. Increase pain when stand up but able to tolerate without loss of balance  Ambulation/Gait Ambulation/Gait assistance: Supervision Gait Distance (Feet): (120 feet + stairs +120 feet) Assistive device: Rolling walker (2 wheeled) Gait Pattern/deviations: Step-through pattern;Decreased step length - right Gait  velocity: decrease   General Gait Details: Requires cuing for RW management and body positioning inside of the RW.   Stairs Stairs: Yes Stairs assistance: Supervision;Min guard Stair Management: Two rails Number of Stairs: 4 General stair comments: vc's provided for BLE sequencing. Pt demonstrated good understanding of given instructions.   Wheelchair Mobility    Modified Rankin (Stroke Patients Only)       Balance Overall balance assessment: Modified Independent Sitting-balance support: No upper extremity supported;Feet supported Sitting balance-Leahy Scale: Good Sitting balance - Comments: steady sitting and reaching within BOS. Good static and dynamic seated balance during functional activity.   Standing balance support: No upper extremity supported Standing balance-Leahy Scale: Good Standing balance comment: steady standing and reaching within BOS                            Cognition Arousal/Alertness: Awake/alert Behavior During Therapy: WFL for tasks assessed/performed Overall Cognitive Status: Within Functional Limits for tasks assessed                                        Exercises Total Joint Exercises Ankle Circles/Pumps: AROM;Strengthening;Both;10 reps;Supine Long Arc Quad: AROM;Strengthening;Right;10 reps;Seated General Exercises - Lower Extremity Hip ABduction/ADduction: AROM;Strengthening;Right;10 reps;Supine Hip Flexion/Marching: AROM;Strengthening;Right;10 reps;Seated    General Comments General comments (skin integrity, edema, etc.): Pt states didn't want to take pain medication and nurse notified.      Pertinent Vitals/Pain Pain Assessment: 0-10 Pain Score: 8  Pain Location: R hip incision Pain Descriptors / Indicators: Sore;Aching;Guarding;Grimacing Pain Intervention(s): Limited activity within patient's  tolerance;Monitored during session;Repositioned(Pt states didn't want to take pain medication and nurse  notified.)    Home Living                      Prior Function            PT Goals (current goals can now be found in the care plan section) Acute Rehab PT Goals Patient Stated Goal: to go home PT Goal Formulation: With patient Time For Goal Achievement: 11/17/18 Potential to Achieve Goals: Good Progress towards PT goals: Progressing toward goals    Frequency    BID      PT Plan Current plan remains appropriate    Co-evaluation              AM-PAC PT "6 Clicks" Mobility   Outcome Measure  Help needed turning from your back to your side while in a flat bed without using bedrails?: A Little Help needed moving from lying on your back to sitting on the side of a flat bed without using bedrails?: A Little Help needed moving to and from a bed to a chair (including a wheelchair)?: A Little Help needed standing up from a chair using your arms (e.g., wheelchair or bedside chair)?: A Little Help needed to walk in hospital room?: A Little Help needed climbing 3-5 steps with a railing? : A Little 6 Click Score: 18    End of Session Equipment Utilized During Treatment: Gait belt Activity Tolerance: Patient limited by pain Patient left: (Pt left sitting at toilet; Nurse tech present and agreed with taking pt back bed. Pt educated for asking help before get up. Call bell on site and reachable by pt) Nurse Communication: Mobility status;Weight bearing status;Precautions PT Visit Diagnosis: Unsteadiness on feet (R26.81);Other abnormalities of gait and mobility (R26.89);Difficulty in walking, not elsewhere classified (R26.2);Pain Pain - Right/Left: Right Pain - part of body: Hip     Time: 1411-1440 PT Time Calculation (min) (ACUTE ONLY): 29 min  Charges:                           Sherrilyn Rist, SPT 11/04/2018, 2:51 PM

## 2018-11-04 NOTE — TOC Benefit Eligibility Note (Signed)
Transition of Care [facility]) Benefit Eligibility Note    Patient Details  Name: Mario [name] MRN: [id number] Date of Birth: 07/30/1950   Medication/Dose: Enoxaparin 40 mg daily x 14 days        Prescription Coverage Preferred Pharmacy: Preferred:  [facility] or [facility]  Spoke with Person/Company/Phone Number:: Fr[place]ico [place], [phone]  Co-Pay: $228.90 for Enoxaparin, Mail order amount would be $182.15        Additional Notes: Lovenox not on formulary    [place] Phone Number: 11/04/2018, 10:41 AM

## 2018-11-04 NOTE — Progress Notes (Signed)
   Subjective: 2 Days Post-Op Procedure(s) (LRB): TOTAL HIP REVISION WITH EXCISION OF HETEROTOPIC OSSIFICATION (Right) Patient reports pain as mild.  Anxiety controlled. Minimal medications taken for anxiety over last 2 days. Patient is well, and has had no acute complaints or problems Denies any CP, SOB, ABD pain. We will continue therapy today. Making good progress with PT Plan is to go Home after hospital stay.  Objective: Vital signs in last 24 hours: Temp:  [97.8 F (36.6 C)-98.6 F (37 C)] 98.6 F (37 C) (08/27 0752) Pulse Rate:  [60-92] 67 (08/27 0752) Resp:  [18] 18 (08/26 2332) BP: (118-145)/(57-67) 145/57 (08/27 0752) SpO2:  [98 %-100 %] 98 % (08/27 0752)  Intake/Output from previous day: 08/26 0701 - 08/27 0700 In: 240 [P.O.:240] Out: 1550 [Urine:1550] Intake/Output this shift: No intake/output data recorded.  Recent Labs    11/02/18 1216 11/03/18 0432  HGB 13.6 12.6*   Recent Labs    11/02/18 1216 11/03/18 0432  WBC 10.1 10.8*  RBC 4.58 4.21*  HCT 41.6 38.2*  PLT 173 176   Recent Labs    11/02/18 1216 11/03/18 0432  NA  --  137  K  --  5.1  CL  --  104  CO2  --  26  BUN  --  9  CREATININE 0.79 0.80  GLUCOSE  --  147*  CALCIUM  --  8.6*   No results for input(s): LABPT, INR in the last 72 hours.  EXAM General - Patient is Alert, Appropriate and Oriented Extremity - Neurovascular intact Sensation intact distally Intact pulses distally Dorsiflexion/Plantar flexion intact No cellulitis present Compartment soft Dressing - dressing C/D/I and no drainage Praveena intact with no drainage Motor Function - intact, moving foot and toes well on exam.   Past Medical History:  Diagnosis Date  . Anginal pain ([place])   . Arthritis   . Back pain    lower  . Claustrophobia   . Depression   . Diabetes mellitus without complication ([place])   . Dyspnea   . Elevated lipids   . Elevated lipids   . Environmental and seasonal allergies   . Fatty liver    . GERD (gastroesophageal reflux disease)   . Glaucoma   . Hepatitis    type A  . HH (hiatus hernia)   . Hip pain Right  . History of kidney stones   . Knee pain    right  . Panic attacks   . Sleep apnea     Assessment/Plan:   2 Days Post-Op Procedure(s) (LRB): TOTAL HIP REVISION WITH EXCISION OF HETEROTOPIC OSSIFICATION (Right) Active Problems:   S/P revision of total hip  Estimated body mass index is 42 kg/m as calculated from the following:   Height as of this encounter: 5\' 9"  (1.753 m).   Weight as of this encounter: 129 kg. Advance diet Up with therapy  Needs bowel movement Labs stable Vital signs are stable Pain well controlled Anxiety controlled Care management to assist with discharge to home with HHPT Friday  DVT Prophylaxis - Lovenox, TED hose and SCDs Weight-Bearing as tolerated to right leg   T. Rachelle Hora, PA-C [place] 11/04/2018, 8:59 AM

## 2018-11-05 LAB — GLUCOSE, CAPILLARY
Glucose-Capillary: 134 mg/dL — ABNORMAL HIGH (ref 70–99)
Glucose-Capillary: 97 mg/dL (ref 70–99)
Glucose-Capillary: 98 mg/dL (ref 70–99)
Glucose-Capillary: 122 mg/dL — ABNORMAL HIGH (ref 70–99)

## 2018-11-05 MED ORDER — OXYCODONE HCL 5 MG PO TABS: 5.0000 mg | ORAL_TABLET | ORAL | 0 refills | Status: DC | PRN

## 2018-11-05 MED ORDER — TRAMADOL HCL 50 MG PO TABS: 50.0000 mg | ORAL_TABLET | Freq: Four times a day (QID) | ORAL | 0 refills | Status: DC

## 2018-11-05 NOTE — Progress Notes (Signed)
Pt ready for discharge home today per MD. Patient assessment unchanged from this morning. Pt met PT goals. Hospital wound vac switched to prevena prior to d/c. Reviewed discharge instructions and prescriptions with pt; all questions answered and pt verbalized understanding. PIV removed, VSS. Pt assisted to car via NT.   Mario [name]  

## 2018-11-05 NOTE — Care Management Important Message (Signed)
Important Message  Patient Details  Name: Mario [name] MRN: [id number] Date of Birth: 12/09/1950   Medicare Important Message Given:  Yes     Juliann Pulse A Stevenson [place] 11/05/2018, 10:39 AM

## 2018-11-05 NOTE — Progress Notes (Signed)
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 PMH: R THR, ant approach 01/26/18, sleep apnea, anxiety depression, diabetes, obesity.    PT Comments    Pt supine at bed and agreeable to PT session when PT came in. Pt tolerated session well with reduced pain throughout the session. Hand held min A assist from supine to sitting on flat bed. Supervision with ambulation and didn't requires standing breaks for ambulation around nursing loop. Pt tolerated the exercise well during the session. Nurse communicated with pt's R hip pain and level of assist with mobility. Car transfer reviewed and pt showing good understanding verbally. Will progress ambulation distance, muscle endurance in later session.      Follow Up Recommendations  Home health PT     Equipment Recommendations  (pt has RW at home)    Recommendations for Other Services       Precautions / Restrictions Precautions Precautions: Anterior Hip Precaution Booklet Issued: Yes (comment) Restrictions Weight Bearing Restrictions: Yes RLE Weight Bearing: Weight bearing as tolerated    Mobility  Bed Mobility Overal bed mobility: Needs Assistance Bed Mobility: Supine to Sit     Supine to sit: Min assist     General bed mobility comments: Min A hand held assist from flat bed; increased time and effort; vc's provided for hand and body positioning  Transfers Overall transfer level: Needs assistance Equipment used: Rolling walker (2 wheeled) Transfers: Sit to/from Stand Sit to Stand: Supervision         General transfer comment: Vc's for hand positioning with RW. Increase pain when stand up but able to tolerate without loss of balance  Ambulation/Gait Ambulation/Gait assistance:  Supervision Gait Distance (Feet): 200 Feet Assistive device: Rolling walker (2 wheeled) Gait Pattern/deviations: Step-through pattern;Decreased step length - right Gait velocity: decrease   General Gait Details: Requires less cuing for RW management and body positioning inside of the RW.   Stairs             Wheelchair Mobility    Modified Rankin (Stroke Patients Only)       Balance Overall balance assessment: Modified Independent Sitting-balance support: No upper extremity supported;Feet supported Sitting balance-Leahy Scale: Good Sitting balance - Comments: steady sitting and reaching within BOS. Good static and dynamic seated balance during functional activity.   Standing balance support: No upper extremity supported Standing balance-Leahy Scale: Good Standing balance comment: steady standing and reaching within BOS                            Cognition Arousal/Alertness: Awake/alert Behavior During Therapy: WFL for tasks assessed/performed Overall Cognitive Status: Within Functional Limits for tasks assessed                                        Exercises Total Joint Exercises Ankle Circles/Pumps: AROM;Strengthening;Both;10 reps;Supine Short Arc Quad: AROM;Strengthening;Right;10 reps;Supine Heel Slides: AROM;Strengthening;Right;10 reps;Supine Hip ABduction/ADduction: AROM;Strengthening;Right;10 reps;Supine Straight Leg Raises: AROM;Strengthening;Right;10 reps;Supine    General Comments General comments (skin integrity, edema, etc.): Dressing intact at beginning/end of the session; nurse communicated      Pertinent Vitals/Pain Pain Assessment: 0-10 Pain Score: 7  Pain Location: R hip incision Pain Descriptors /  Indicators: Sore;Aching;Guarding;Grimacing Pain Intervention(s): Limited activity within patient's tolerance;Monitored during session;Premedicated before session;Repositioned    Home Living Family/patient expects to be  discharged to:: Private residence Living Arrangements: Spouse/significant other                  Prior Function            PT Goals (current goals can now be found in the care plan section) Acute Rehab PT Goals Patient Stated Goal: to go home PT Goal Formulation: With patient Time For Goal Achievement: 11/17/18 Potential to Achieve Goals: Good Progress towards PT goals: Progressing toward goals    Frequency    BID      PT Plan Current plan remains appropriate    Co-evaluation              AM-PAC PT "6 Clicks" Mobility   Outcome Measure  Help needed turning from your back to your side while in a flat bed without using bedrails?: A Little Help needed moving from lying on your back to sitting on the side of a flat bed without using bedrails?: A Little Help needed moving to and from a bed to a chair (including a wheelchair)?: A Little Help needed standing up from a chair using your arms (e.g., wheelchair or bedside chair)?: A Little Help needed to walk in hospital room?: A Little Help needed climbing 3-5 steps with a railing? : A Little 6 Click Score: 18    End of Session Equipment Utilized During Treatment: Gait belt Activity Tolerance: Patient limited by pain   Nurse Communication: Mobility status;Weight bearing status;Precautions PT Visit Diagnosis: Unsteadiness on feet (R26.81);Other abnormalities of gait and mobility (R26.89);Difficulty in walking, not elsewhere classified (R26.2);Pain Pain - Right/Left: Right Pain - part of body: Hip     Time: [id number] PT Time Calculation (min) (ACUTE ONLY): 30 min  Charges:               Sherrilyn Rist, SPT 11/05/2018, 10:43 AM

## 2018-11-05 NOTE — Progress Notes (Signed)
   Subjective: 3 Days Post-Op Procedure(s) (LRB): TOTAL HIP REVISION WITH EXCISION OF HETEROTOPIC OSSIFICATION (Right) Patient reports pain as mild.  Anxiety controlled. Minimal medications taken for anxiety . Patient is well, and has had no acute complaints or problems Denies any CP, SOB, ABD pain. We will continue therapy today. Making good progress with PT Plan is to go Home after hospital stay.  Objective: Vital signs in last 24 hours: Temp:  [98.1 F (36.7 C)-99 F (37.2 C)] 98.1 F (36.7 C) (08/27 2332) Pulse Rate:  [70-86] 79 (08/27 2337) Resp:  [16] 16 (08/27 2332) BP: (100-131)/(33-64) 131/64 (08/27 2337) SpO2:  [96 %-98 %] 96 % (08/27 2332)  Intake/Output from previous day: 08/27 0701 - 08/28 0700 In: 240 [P.O.:240] Out: 1550 [Urine:1550] Intake/Output this shift: No intake/output data recorded.  Recent Labs    11/02/18 1216 11/03/18 0432  HGB 13.6 12.6*   Recent Labs    11/02/18 1216 11/03/18 0432  WBC 10.1 10.8*  RBC 4.58 4.21*  HCT 41.6 38.2*  PLT 173 176   Recent Labs    11/02/18 1216 11/03/18 0432  NA  --  137  K  --  5.1  CL  --  104  CO2  --  26  BUN  --  9  CREATININE 0.79 0.80  GLUCOSE  --  147*  CALCIUM  --  8.6*   No results for input(s): LABPT, INR in the last 72 hours.  EXAM General - Patient is Alert, Appropriate and Oriented Extremity - Neurovascular intact Sensation intact distally Intact pulses distally Dorsiflexion/Plantar flexion intact No cellulitis present Compartment soft Dressing - dressing C/D/I and no drainage Praveena intact with no drainage Motor Function - intact, moving foot and toes well on exam.   Past Medical History:  Diagnosis Date  . Anginal pain ([place])   . Arthritis   . Back pain    lower  . Claustrophobia   . Depression   . Diabetes mellitus without complication ([place])   . Dyspnea   . Elevated lipids   . Elevated lipids   . Environmental and seasonal allergies   . Fatty liver   . GERD  (gastroesophageal reflux disease)   . Glaucoma   . Hepatitis    type A  . HH (hiatus hernia)   . Hip pain Right  . History of kidney stones   . Knee pain    right  . Panic attacks   . Sleep apnea     Assessment/Plan:   3 Days Post-Op Procedure(s) (LRB): TOTAL HIP REVISION WITH EXCISION OF HETEROTOPIC OSSIFICATION (Right) Active Problems:   S/P revision of total hip  Estimated body mass index is 42 kg/m as calculated from the following:   Height as of this encounter: 5\' 9"  (1.753 m).   Weight as of this encounter: 129 kg. Advance diet Up with therapy  Labs stable Vital signs are stable Pain well controlled Anxiety controlled Care management to assist with discharge to home with HHPT today  DVT Prophylaxis - Lovenox, TED hose and SCDs Weight-Bearing as tolerated to right leg   T. Rachelle Hora, PA-C [place] 11/05/2018, 8:10 AM

## 2018-12-06 ENCOUNTER — Other Ambulatory Visit: Payer: Self-pay

## 2018-12-06 ENCOUNTER — Ambulatory Visit
Admission: RE | Admit: 2018-12-06 | Discharge: 2018-12-06 | Disposition: A | Discharge status: Home / Self Care | Payer: Medicare Other | Source: Ambulatory Visit | Attending: Radiation Oncology | Admitting: Radiation Oncology

## 2018-12-06 DIAGNOSIS — M898X9 Other specified disorders of bone, unspecified site: Secondary | ICD-10-CM

## 2018-12-06 NOTE — Progress Notes (Signed)
Radiation Oncology Follow up Note  Name: Mario [name]   Date:   12/06/2018 MRN:  [id number] DOB: March 15, 1950    This 68 y.o. 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  REFERRING PROVIDER: Baxter Hire, MD  HPI: Patient is a 68 year old male now seen at 1 month having completed radiation therapy to his right hip to prevent heterotrophic ossification who just underwent revision of right total hip arthroplasty from original surgery in November 2019 seen today he states his pain is 80% improved he is ambulating well currently undergoing rehab.  He states he has a right knee and possibly his left hip that may need surgery in the future..  COMPLICATIONS OF TREATMENT: none  FOLLOW UP COMPLIANCE: keeps appointments   PHYSICAL EXAM:  BP (!) (P) 157/83 (BP Location: Left Arm, Patient Position: Sitting)   Pulse (P) 79   Temp (P) 98 F (36.7 C) (Tympanic)   Resp (P) 18  Range of motion of his lower extremities does not elicit pain motor or sensory and DTR levels are equal and symmetric in lower extremities.  Well-developed well-nourished patient in NAD. HEENT reveals PERLA, EOMI, discs not visualized.  Oral cavity is clear. No oral mucosal lesions are identified. Neck is clear without evidence of cervical or supraclavicular adenopathy. Lungs are clear to A&P. Cardiac examination is essentially unremarkable with regular rate and rhythm without murmur rub or thrill. Abdomen is benign with no organomegaly or masses noted. Motor sensory and DTR levels are equal and symmetric in the upper and lower extremities. Cranial nerves II through XII are grossly intact. Proprioception is intact. No peripheral adenopathy or edema is identified. No motor or sensory levels are noted. Crude visual fields are within normal range.  RADIOLOGY RESULTS: No current films for review  PLAN:  Present time patient is doing well seems to be ambulating without major pain or side effect.  I am pleased with his overall progress.  I will turn follow-up care over to orthopedic surgery.  I be happy to reevaluate the patient anytime the future should further treatment to prevent heterotropic ossification be indicated.  I would like to take this opportunity to thank you for allowing me to participate in the care of your patient.Noreene Filbert, MD

## 2018-12-15 ENCOUNTER — Other Ambulatory Visit: Payer: Self-pay | Admitting: Orthopedic Surgery

## 2018-12-15 DIAGNOSIS — M545 Low back pain, unspecified: Secondary | ICD-10-CM

## 2018-12-24 ENCOUNTER — Other Ambulatory Visit: Payer: Self-pay

## 2018-12-24 ENCOUNTER — Ambulatory Visit
Admission: RE | Admit: 2018-12-24 | Discharge: 2018-12-24 | Disposition: A | Discharge status: Home / Self Care | Payer: Medicare Other | Source: Ambulatory Visit | Attending: Orthopedic Surgery | Admitting: Orthopedic Surgery

## 2018-12-24 DIAGNOSIS — M545 Low back pain, unspecified: Secondary | ICD-10-CM

## 2018-12-24 IMAGING — CT CT L SPINE W/O CM
3 of 4 series · 12 of 33 positions shown, 14 images · non-contrast
Comparison: Bone scan [DATE]. CT abdomen [DATE].

CLINICAL DATA: Left-sided low back pain over the last 2 years.
Previous fusion surgery.

EXAM:
CT LUMBAR SPINE WITHOUT CONTRAST
TECHNIQUE: Multidetector CT imaging of the lumbar spine was performed without
intravenous contrast administration. Multiplanar CT image
reconstructions were also generated.

[Series 2: lspine st l-spine · axial · 0.35mm/px · z∈[+1584,+1770]mm · 4 of 141 slices shown, 5 images]
[im 24/141  soft-tissue]
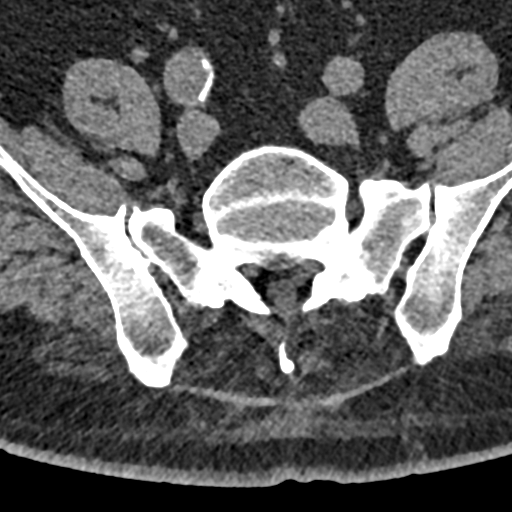
[im 24/141  bone]
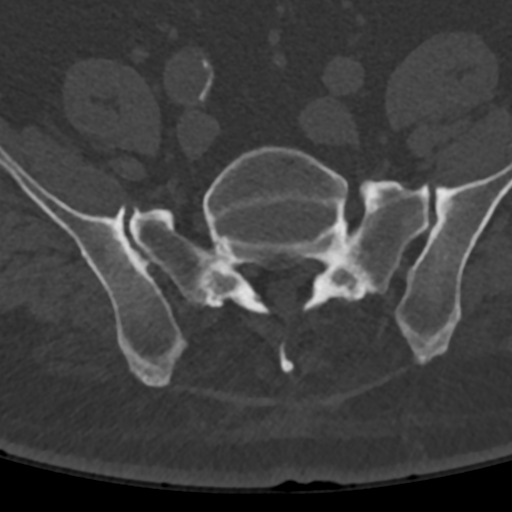
[im 47/141  bone]
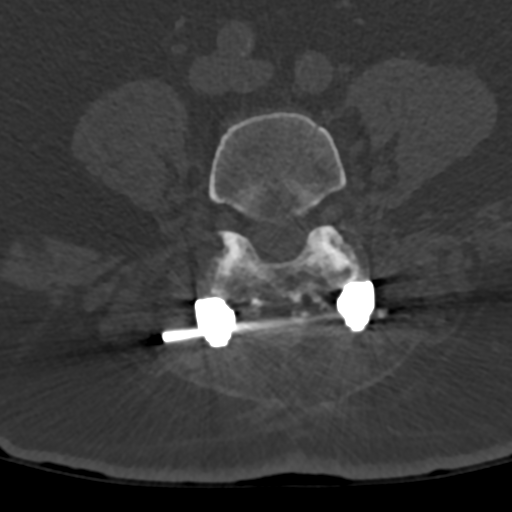
[im 94/141  bone]
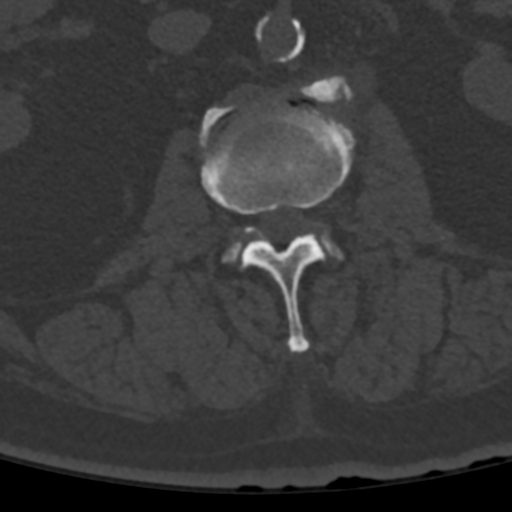
[im 117/141  bone]
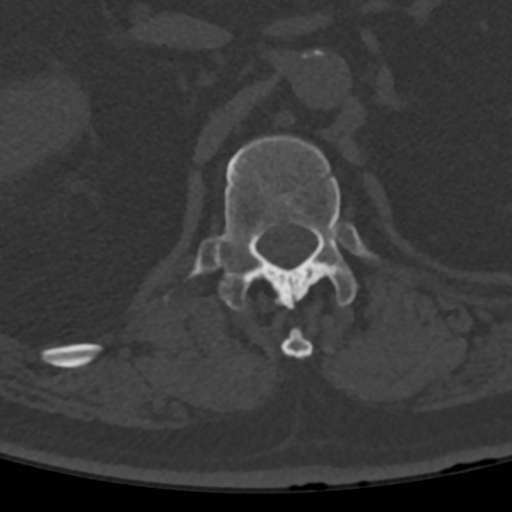

[Series 4: sag bone l-spine · sagittal · 0.35mm/px · 5 of 97 slices shown, 6 images]
[im 33/97  bone]
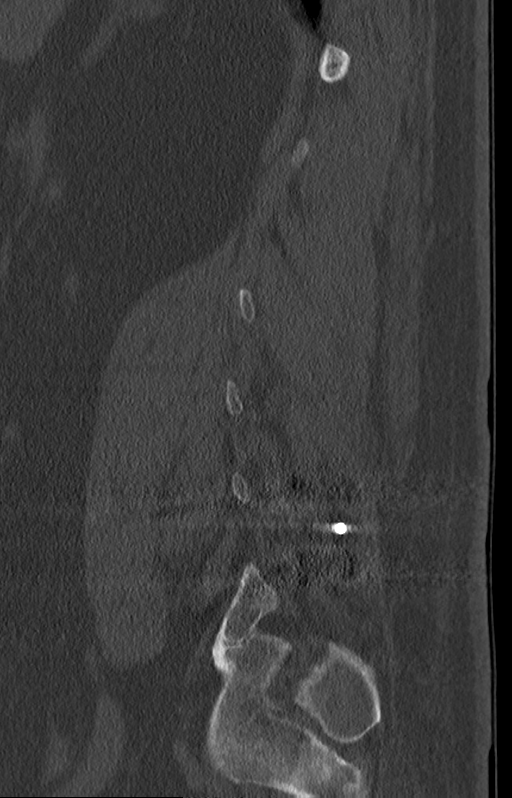
[im 41/97  bone]
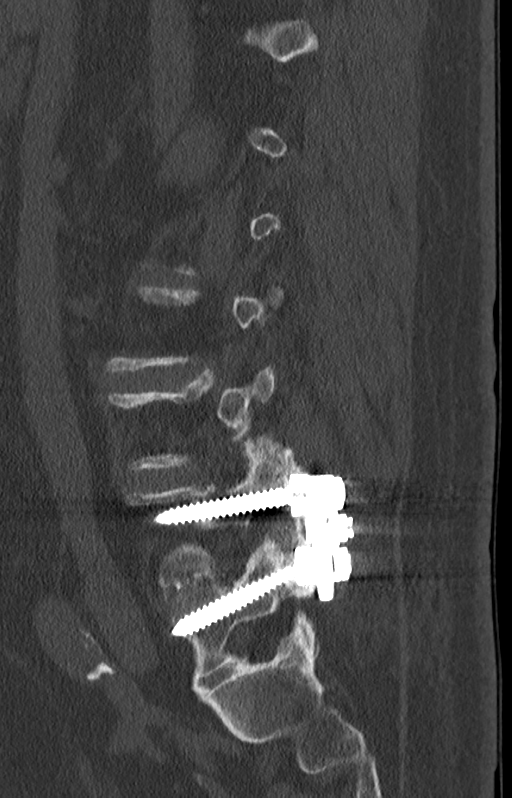
[im 49/97  soft-tissue]
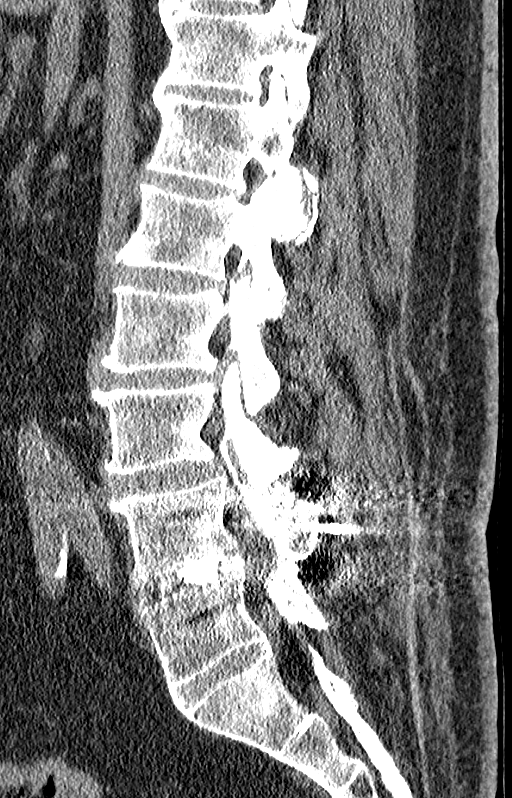
[im 49/97  bone]
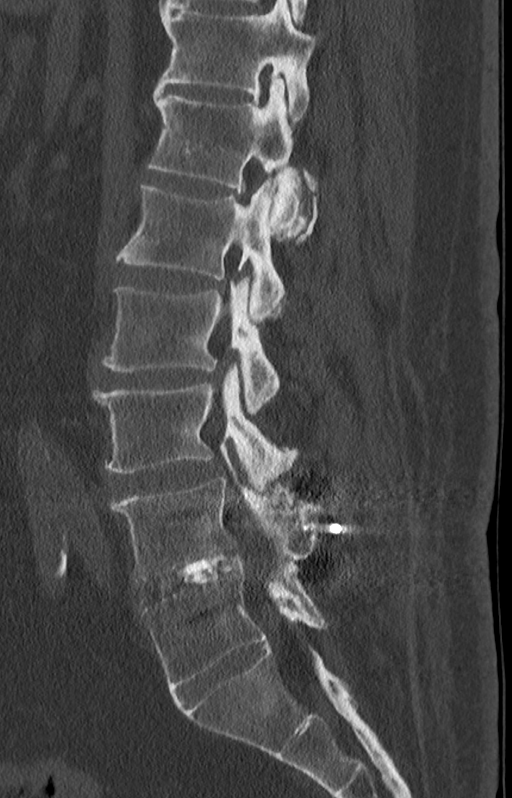
[im 57/97  bone]
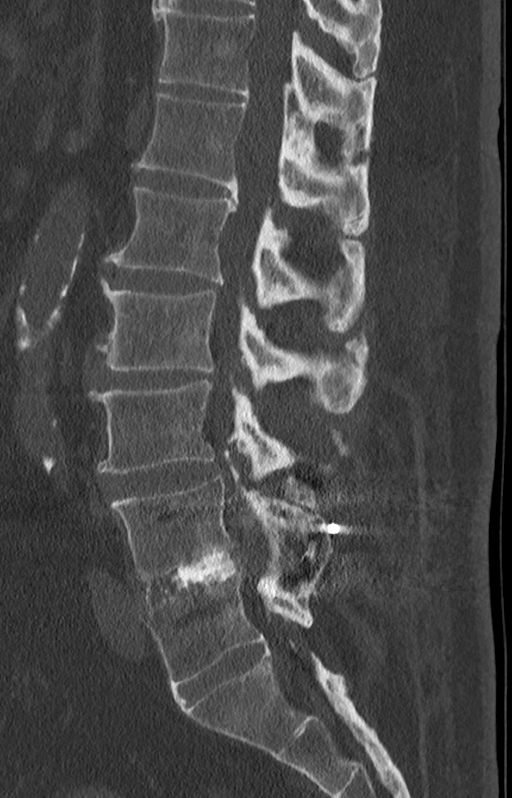
[im 65/97  bone]
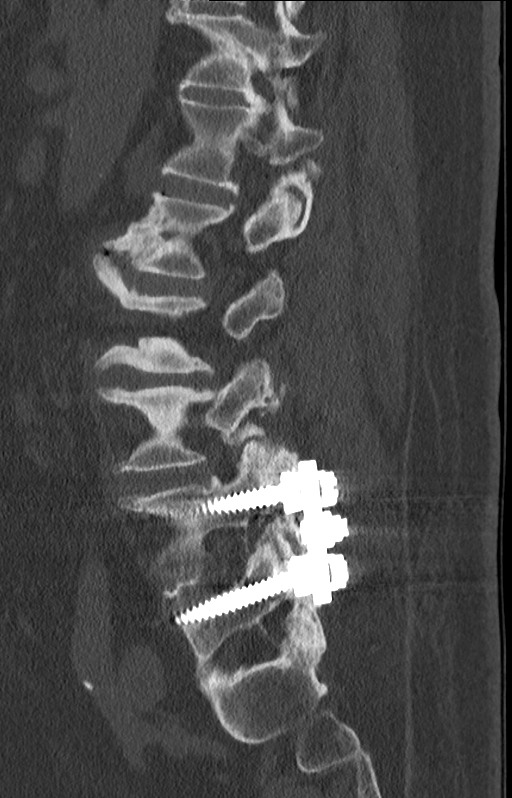

[Series 6: cor bone l-spine · coronal · 0.38mm/px · 3 of 90 slices shown]
[im 18/90  bone]
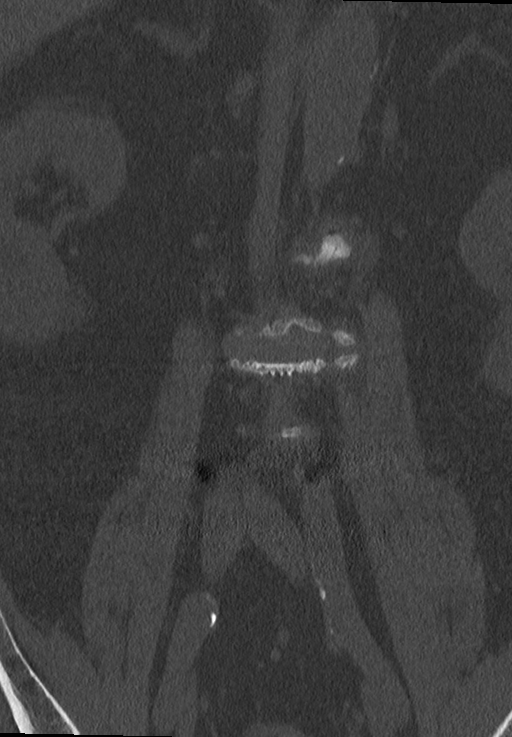
[im 36/90  bone]
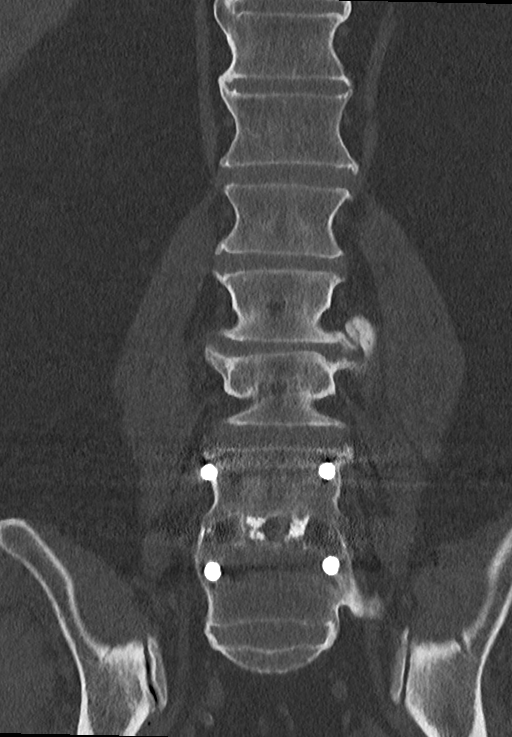
[im 54/90  bone]
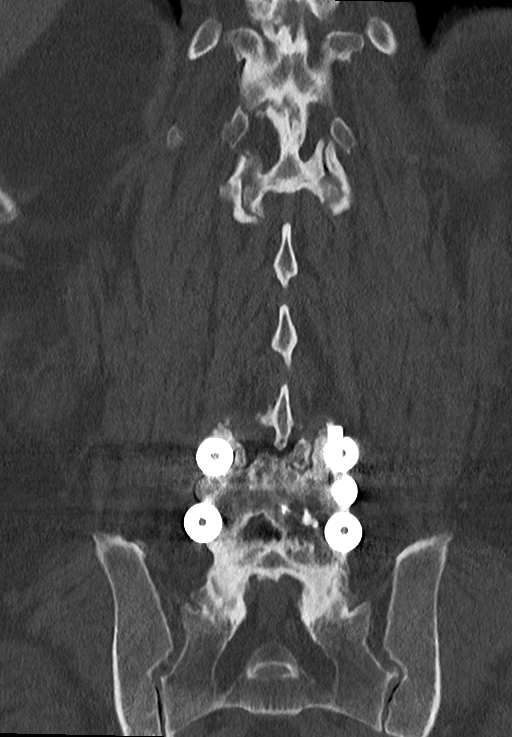

[12 of 33 positions shown; findings below may reference images not displayed]

FINDINGS: Segmentation: 5 lumbar type vertebral bodies. S1 has some
transitional features.

Alignment: 2 mm degenerative anterolisthesis L4-5. Otherwise normal.

Vertebrae: No fracture or primary bone lesion. Previous diskectomy,
decompression and fusion procedure at L5-S1 with solid union. Solid
anterior and posterior fusion at T12-L1.

Paraspinal and other soft tissues: Negative except for aortic
atherosclerosis.

Disc levels: T12-L1: Solid bridging osteophytes both anterior and
posterior. Non-compressive narrowing of the canal and foramina.

L1-2: Annular bulging and calcification. Bilateral facet
degeneration ligamentous calcification. Mild canal and foraminal
stenosis but no likely neural compression

L2-3: Annular bulging and calcification. Facet and ligamentous
hypertrophy with ligamentous calcification. Moderate multifactorial
stenosis at this level which could possibly be symptomatic.

L3-4: Endplate osteophytes and bulging of the disc. Facet and
ligamentous hypertrophy with ligamentous calcification. Moderate
stenosis of the canal and foramina which could possibly be
symptomatic.

L4-5: Bilateral facet arthropathy with hypertrophic degenerative
change and ligamentous calcification, allowing anterolisthesis of 2
mm. Bulging of the disc. Severe multifactorial stenosis at this
level that could cause neural compression on either or both sides.

L5-S1: Distant posterior decompression, diskectomy and fusion
procedure. Fusion is solid. Sufficient patency of the and foramina.
Hardware complication.

S1-2: Transitional level. Wide patency of the and foramina.

Bilateral sacroiliac osteoarthritis.
IMPRESSION: 1. Good appearance at the L5-S1 level. Solid union.
2. L2-3: Moderate multifactorial stenosis which could possibly be
symptomatic.
3. L3-4: Moderate multifactorial stenosis of the canal and foramina
which could possibly be symptomatic.
4. L4-5: Severe multifactorial spinal stenosis likely to cause
neural compression on either or both sides.
5. Bilateral sacroiliac osteoarthritis.

Aortic Atherosclerosis ([6F]-[6F]).

## 2019-01-04 ENCOUNTER — Other Ambulatory Visit: Payer: Self-pay | Admitting: Gastroenterology

## 2019-01-04 DIAGNOSIS — K7469 Other cirrhosis of liver: Secondary | ICD-10-CM

## 2019-01-17 ENCOUNTER — Other Ambulatory Visit: Payer: Self-pay

## 2019-01-17 ENCOUNTER — Ambulatory Visit
Admission: RE | Admit: 2019-01-17 | Discharge: 2019-01-17 | Disposition: A | Discharge status: Home / Self Care | Payer: Medicare Other | Source: Ambulatory Visit | Attending: Gastroenterology | Admitting: Gastroenterology

## 2019-01-17 DIAGNOSIS — K7469 Other cirrhosis of liver: Secondary | ICD-10-CM | POA: Insufficient documentation

## 2019-01-17 IMAGING — US US ABDOMEN LIMITED
1 series · 14 of 25 positions shown · non-contrast
Comparison: Right upper quadrant ultrasound dated [DATE]

CLINICAL DATA: 68-year-old male with cirrhosis.

EXAM:
ULTRASOUND ABDOMEN LIMITED RIGHT UPPER QUADRANT

[Series 1: us abdomen limited · 0.28mm/px · 14 of 40 slices shown]
[im 1/40]
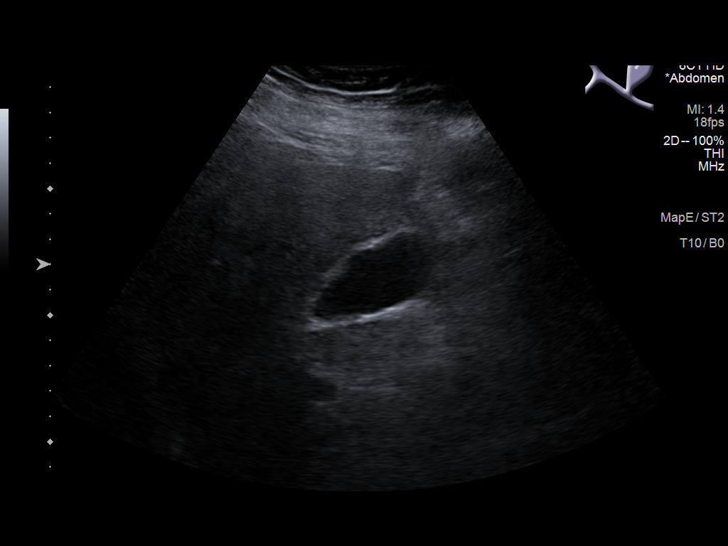
[im 4/40]
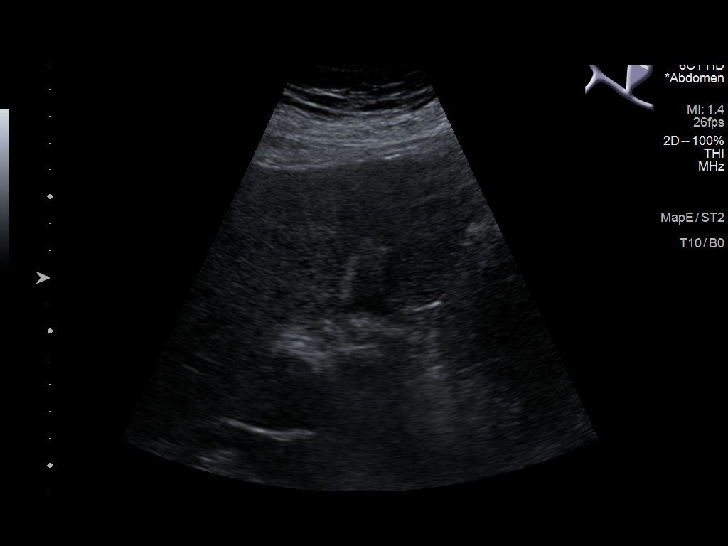
[im 7/40]
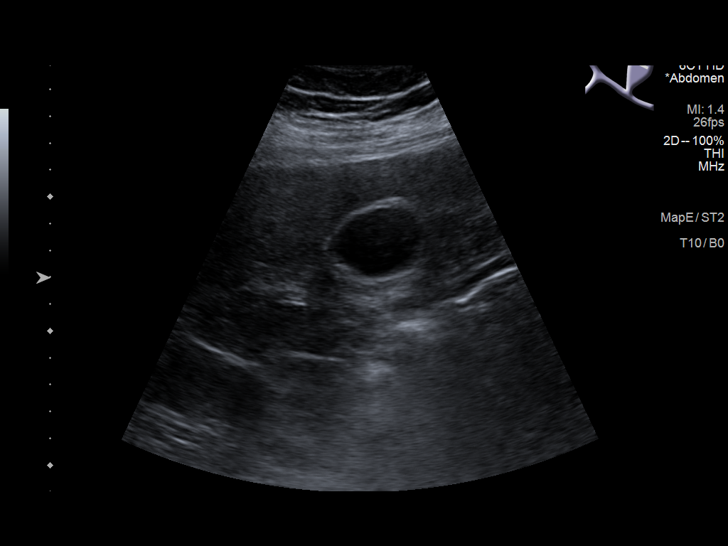
[im 10/40]
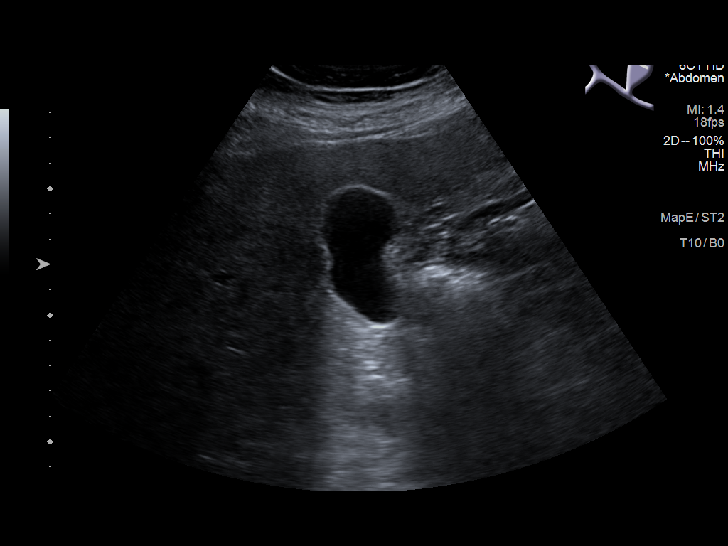
[im 14/40]
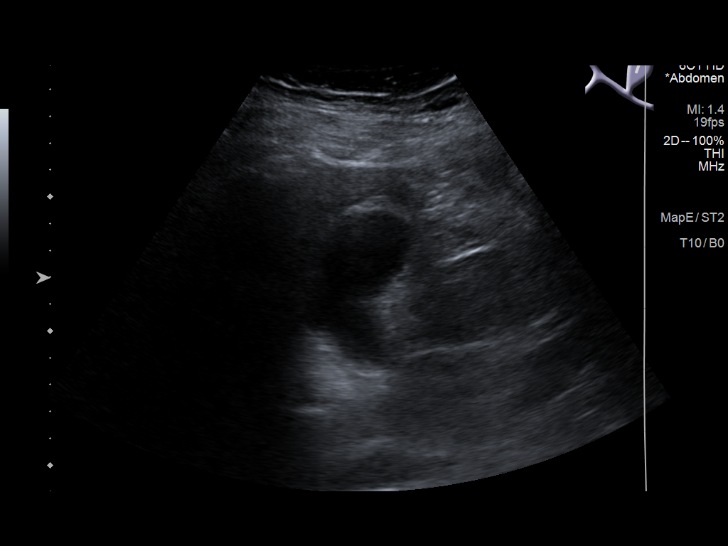
[im 15/40]
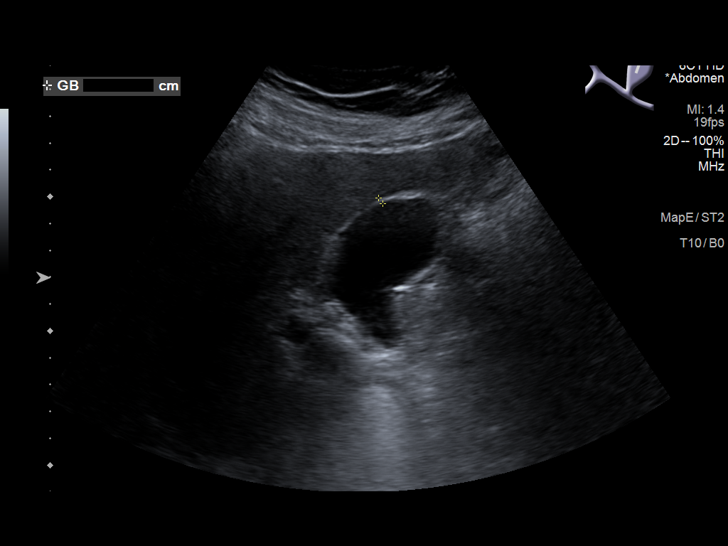
[im 18/40]
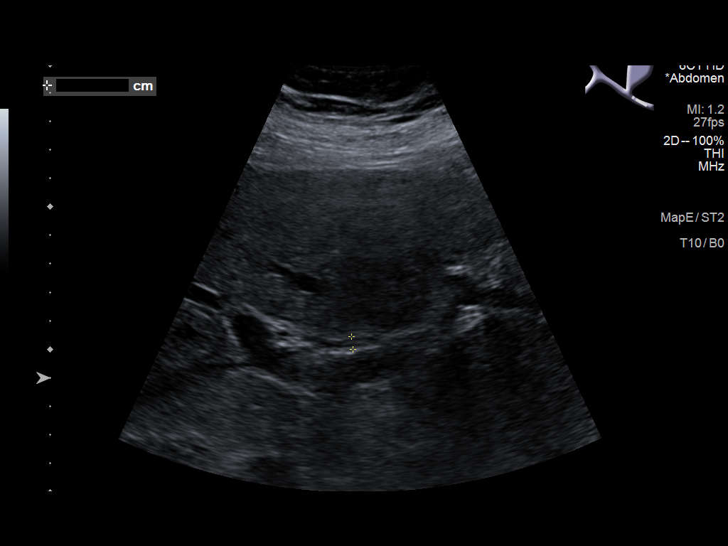
[im 22/40]
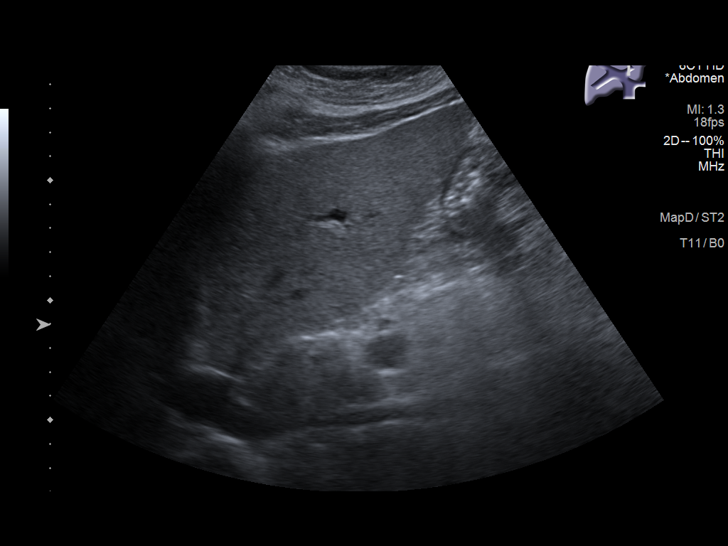
[im 25/40]
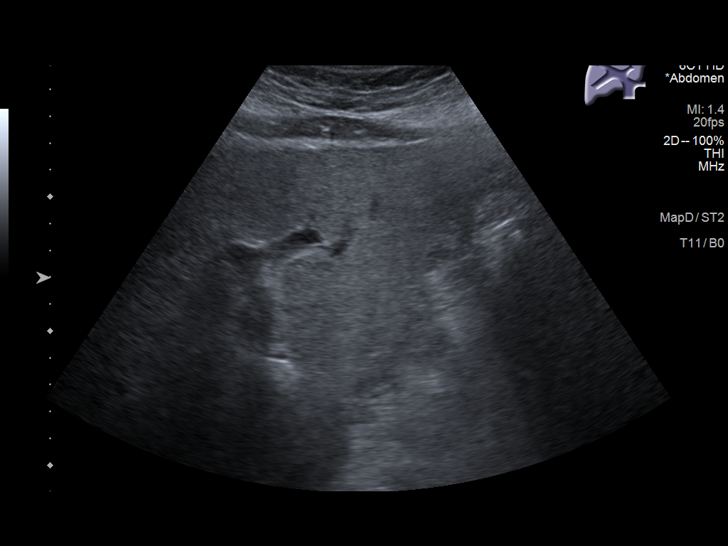
[im 27/40]
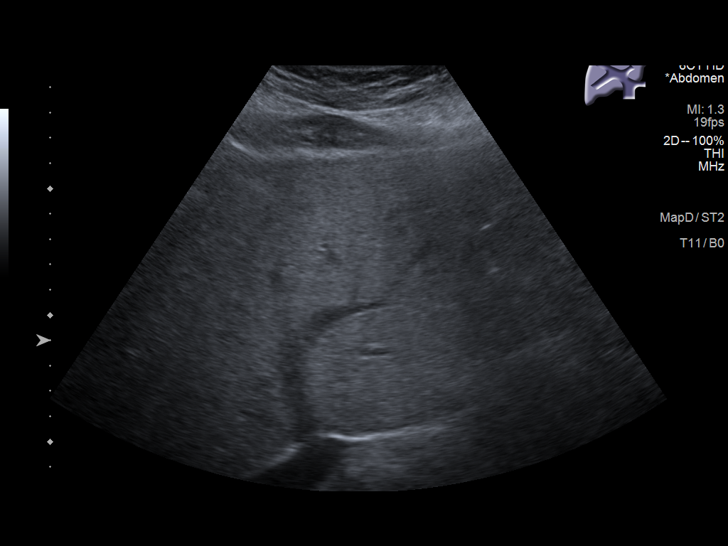
[im 30/40]
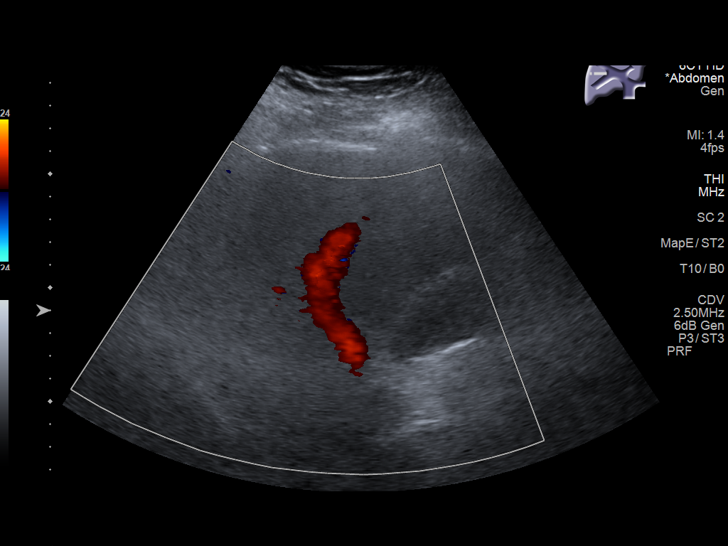
[im 33/40]
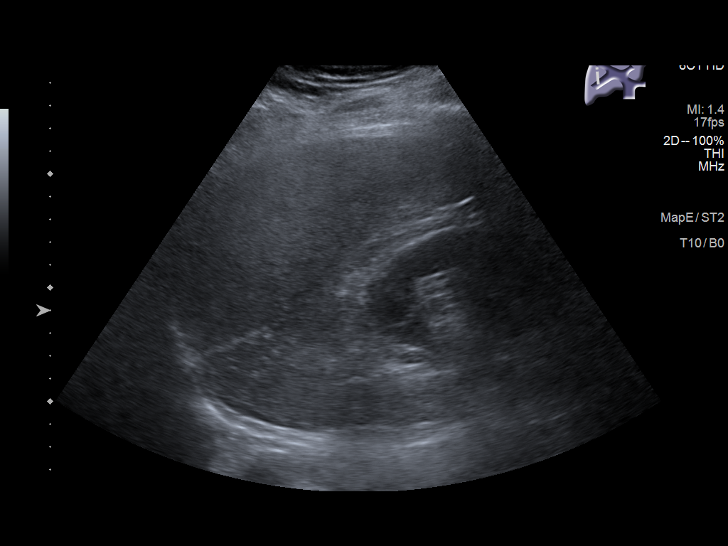
[im 36/40]
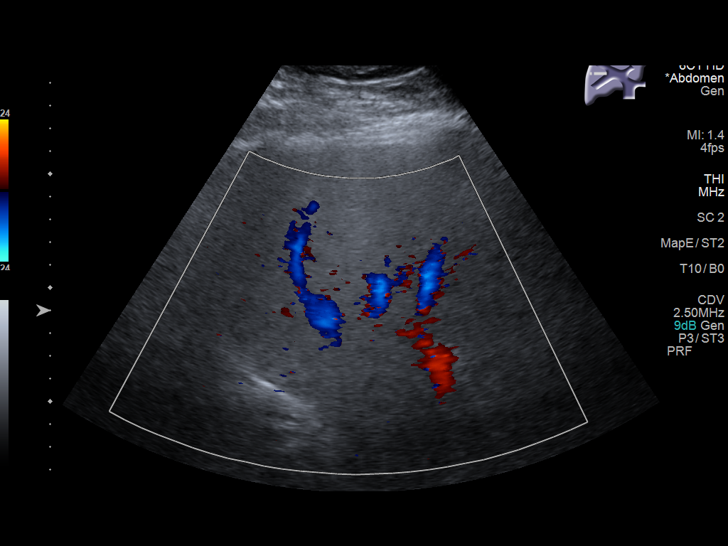
[im 40/40]
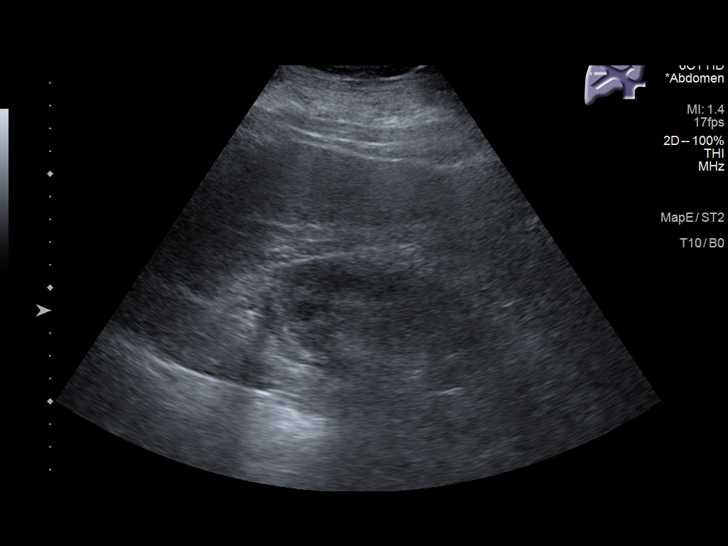

[14 of 25 positions shown; findings below may reference images not displayed]

FINDINGS: Gallbladder:

No gallstones or wall thickening visualized. No sonographic Murphy
sign noted by sonographer.

Common bile duct:

Diameter: 5 mm

Liver:

There is diffuse increased liver echogenicity, likely combination of
fatty infiltration and known cirrhosis. Evaluation for liver mass is
very limited due to increased background echogenicity. Portal vein
is patent on color Doppler imaging with normal direction of blood
flow towards the liver.

Other: None.
IMPRESSION: 1. Echogenic liver.
2. No gallstone.
3. Patent main portal vein with hepatopetal flow.
4. No ascites.

## 2019-05-16 ENCOUNTER — Encounter: Payer: Self-pay | Admitting: Student in an Organized Health Care Education/Training Program

## 2019-05-16 ENCOUNTER — Other Ambulatory Visit: Payer: Self-pay

## 2019-05-16 ENCOUNTER — Ambulatory Visit
Payer: Medicare Other | Attending: Student in an Organized Health Care Education/Training Program | Admitting: Student in an Organized Health Care Education/Training Program

## 2019-05-16 VITALS — BP 137/70 | HR 75 | Temp 97.3°F | Resp 18 | Ht 69.0 in | Wt 292.0 lb

## 2019-05-16 DIAGNOSIS — G894 Chronic pain syndrome: Secondary | ICD-10-CM | POA: Diagnosis present

## 2019-05-16 DIAGNOSIS — M48062 Spinal stenosis, lumbar region with neurogenic claudication: Secondary | ICD-10-CM | POA: Diagnosis present

## 2019-05-16 DIAGNOSIS — Z96649 Presence of unspecified artificial hip joint: Secondary | ICD-10-CM | POA: Insufficient documentation

## 2019-05-16 DIAGNOSIS — M5416 Radiculopathy, lumbar region: Secondary | ICD-10-CM | POA: Diagnosis present

## 2019-05-16 DIAGNOSIS — M47816 Spondylosis without myelopathy or radiculopathy, lumbar region: Secondary | ICD-10-CM | POA: Diagnosis present

## 2019-05-16 DIAGNOSIS — G8929 Other chronic pain: Secondary | ICD-10-CM | POA: Diagnosis present

## 2019-05-16 DIAGNOSIS — M47818 Spondylosis without myelopathy or radiculopathy, sacral and sacrococcygeal region: Secondary | ICD-10-CM | POA: Insufficient documentation

## 2019-05-16 DIAGNOSIS — M533 Sacrococcygeal disorders, not elsewhere classified: Secondary | ICD-10-CM | POA: Insufficient documentation

## 2019-05-16 DIAGNOSIS — Z96641 Presence of right artificial hip joint: Secondary | ICD-10-CM | POA: Diagnosis present

## 2019-05-16 DIAGNOSIS — Z981 Arthrodesis status: Secondary | ICD-10-CM | POA: Diagnosis present

## 2019-05-16 NOTE — Progress Notes (Signed)
Patient: Mario [name]  Service Category: E/M  Provider: Gillis Santa, MD  DOB: 1951-01-02  DOS: 05/16/2019  Referring Provider: Meade Maw, MD  MRN: [id number]  Setting: Ambulatory outpatient  PCP: Baxter Hire, MD  Type: New Patient  Specialty: Interventional Pain Management    Location: Office  Delivery: Face-to-face     Primary Reason(s) for Visit: Encounter for initial evaluation of one or more chronic problems (new to examiner) potentially causing chronic pain, and posing a threat to normal musculoskeletal function. (Level of risk: High) CC: Back Pain (lumbar bilateral ), Knee Pain (right ), Shoulder Pain (bilateral.  very limited ROM), and Hip Pain (right)  HPI  Mario [name] is a 69 y.o. year old, male patient, who comes today to see [name] for the first time for an initial evaluation of his chronic pain. He has Status post total hip replacement, right; S/P revision of total hip; SI joint arthritis; Sacroiliac joint pain; History of lumbar fusion  (Hx of L5/S1 in 2000); Spinal stenosis, lumbar region, with neurogenic claudication; Lumbar radiculopathy; Lumbar facet arthropathy; and Chronic pain syndrome on their problem list. Today he comes in for evaluation of his Back Pain (lumbar bilateral ), Knee Pain (right ), Shoulder Pain (bilateral.  very limited ROM), and Hip Pain (right)  Pain Assessment: Location: Lower, Left, Right Back Radiating: into both hips and around to the groin area and testicles Onset: More than a month ago Duration: Chronic pain Quality: Discomfort, Aching, Sharp, Dull, Constant Severity: 7 /10 (subjective, self-reported pain score)  Note: Reported level is compatible with observation.                         When using our objective Pain Scale, levels between 6 and 10/10 are said to belong in an emergency room, as it progressively worsens from a 6/10, described as severely limiting, requiring emergency care not usually available at an outpatient pain management  facility. At a 6/10 level, communication becomes difficult and requires great effort. Assistance to reach the emergency department may be required. Facial flushing and profuse sweating along with potentially dangerous increases in heart rate and blood pressure will be evident. Effect on ADL: sitting too long is a problem, very limited ROM Timing: Constant Modifying factors: nothing currently BP: 137/70  HR: 75  Onset and Duration: Gradual and Present longer than 3 months Cause of pain: "old age" Severity: Getting worse, NAS-11 at its worse: 7/10, NAS-11 at its best: 5/10, NAS-11 now: 6/10 and NAS-11 on the average: 6/10 Timing: Not influenced by the time of the day, During activity or exercise and After activity or exercise Aggravating Factors: Bending, Bowel movements, Climbing, Intercourse (sex), Kneeling, Lifiting, Prolonged sitting, Prolonged standing, Squatting, Stooping , Surgery made it worse, Twisting, Walking, Walking uphill and Walking downhill Alleviating Factors: TENS and denies any relief  Associated Problems: Constipation, Day-time cramps, Night-time cramps, Depression, Dizziness, Erectile dysfunction, Fatigue, Impotence, Nausea, Numbness, Personality changes, Sadness, Spasms, Sweating, Temperature changes, Tingling, Weakness, Pain that wakes patient up and Pain that does not allow patient to sleep Quality of Pain: Aching, Agonizing, Annoying, Burning, Deep, Disabling, Distressing, Dull, Exhausting, Fearful, Nagging, Pressure-like, Pulsating, Sharp, Shooting, Sickening, Stabbing, Tender, Throbbing, Tingling and Uncomfortable Previous Examinations or Tests: CT scan, Endoscopy, Epidurogram, MRI scan, Myelogram, Nerve block, Spinal tap and X-rays Previous Treatments: Epidural steroid injections, Narcotic medications, Physical Therapy, Pool exercises, Steroid treatments by mouth, TENS and Traction  The patient comes into the clinics today for the first  time for a chronic pain management  evaluation.   Patient is a pleasant 69 year old male with a history of right hip total arthroplasty in August 2020, L5-S1 fusion in 2000 who presents with a chief complaint of low back pain which is bilateral, right hip pain, right SI joint pain, bilateral shoulder pain as well.  He states that the majority of his pain is localized in his low back and his hip region.  He has difficulty walking and performing basic ADLs such as putting on his shoes, putting on his socks, using the bathroom.  He has significant pain with these activities.  Patient has had a L4 trigger point injection in the past which he states amplified his pain.  He has been evaluated by Dr. Cari Caraway neurosurgery.  Surgical intervention was not recommended given the patient's extent of his lumbar facet arthrosis.  Patient has significant and severe multifactorial spinal stenosis at L4-L5 and moderate at L2-L3 and L3-L4.  He has tried physical therapy in the past and has also tried steroid medications by mouth.  He would like to avoid opioid medications as they are not helpful.  Historic Controlled Substance Pharmacotherapy Review   Aberrant behavior: None observed or detected today Risk factors for fatal opioid overdose: Significant anxiety, did not respond previously to opioid medications Fatal overdose hazard ratio (HR): Calculation deferred Non-fatal overdose hazard ratio (HR): Calculation deferred Risk of opioid abuse or dependence: 0.7-3.0% with doses ? 36 MME/day and 6.1-26% with doses ? 120 MME/day. Substance use disorder (SUD) risk level: High Personal History of Substance Abuse (SUD-Substance use disorder):  Alcohol: Negative  Illegal Drugs: Negative  Rx Drugs: Negative  ORT Risk Level calculation: High Risk Opioid Risk Tool - 05/16/19 1020      Family History of Substance Abuse   Alcohol  Positive Male    Illegal Drugs  Negative    Rx Drugs  Positive Male or Male      Personal History of Substance Abuse    Alcohol  Negative    Illegal Drugs  Negative    Rx Drugs  Negative      Psychological Disease   Psychological Disease  Positive    ADD  Negative    OCD  Negative    Bipolar  Negative    Schizophrenia  Negative    Depression  Positive   extreme anxiety     Total Score   Opioid Risk Tool Scoring  10    Opioid Risk Interpretation  High Risk      ORT Scoring interpretation table:  Score <3 = Low Risk for SUD  Score between 4-7 = Moderate Risk for SUD  Score >8 = High Risk for Opioid Abuse     Pharmacologic Plan: No opioid analgesics.            Initial impression: Mr. Mathieu indicated having no interest in opioid therapy, at this point.  Meds   Current Outpatient Medications:  .  BAYER ASPIRIN EC LOW DOSE 81 MG EC tablet, Take 81 mg by mouth daily., Disp: , Rfl:  .  CALCIUM-MAGNESIUM-ZINC PO, Take 1 tablet by mouth 2 (two) times a week. , Disp: , Rfl:  .  Cholecalciferol (VITAMIN D3 PO), Take 1,000 Units by mouth 2 (two) times a week. , Disp: , Rfl:  .  Cyanocobalamin (HM SUPER VITAMIN B12) 2500 MCG CHEW, Chew 2,500 mcg by mouth 2 (two) times a week. , Disp: , Rfl:  .  glimepiride (AMARYL) 4 MG tablet, Take  4 mg by mouth at bedtime., Disp: , Rfl:  .  Magnesium 400 MG CAPS, Take 400 mg by mouth daily. , Disp: , Rfl:  .  metFORMIN (GLUCOPHAGE) 1000 MG tablet, Take 1,000 mg by mouth 2 (two) times daily with a meal., Disp: , Rfl:  .  pravastatin (PRAVACHOL) 40 MG tablet, Take 40 mg by mouth daily., Disp: , Rfl:  .  traZODone (DESYREL) 100 MG tablet, Take 100 mg by mouth at bedtime., Disp: , Rfl:  .  acetaminophen (TYLENOL) 500 MG tablet, Take 500 mg by mouth every 6 (six) hours as needed (pain). , Disp: , Rfl:  .  diazepam (VALIUM) 5 MG tablet, Take 1 tablet (5 mg total) by mouth every 6 (six) hours as needed for anxiety., Disp: 10 tablet, Rfl: 0 .  LORazepam (ATIVAN) 0.5 MG tablet, Take 0.5 mg by mouth every 8 (eight) hours., Disp: , Rfl:  .  oxyCODONE (OXY IR/ROXICODONE) 5 MG  immediate release tablet, Take 1-2 tablets (5-10 mg total) by mouth every 4 (four) hours as needed for moderate pain (pain score 4-6). (Patient not taking: Reported on 12/06/2018), Disp: 20 tablet, Rfl: 0 .  traMADol (ULTRAM) 50 MG tablet, Take 1 tablet (50 mg total) by mouth every 6 (six) hours., Disp: 30 tablet, Rfl: 0 .  zolpidem (AMBIEN) 10 MG tablet, Take 10 mg by mouth at bedtime as needed for sleep., Disp: , Rfl:   Imaging Review    Results for orders placed during the hospital encounter of 12/24/18  CT LUMBAR SPINE WO CONTRAST   Narrative CLINICAL DATA:  Left-sided low back pain over the last 2 years. Previous fusion surgery.  EXAM: CT LUMBAR SPINE WITHOUT CONTRAST  TECHNIQUE: Multidetector CT imaging of the lumbar spine was performed without intravenous contrast administration. Multiplanar CT image reconstructions were also generated.  COMPARISON:  Bone scan 09/23/2018. CT abdomen 03/17/2018.  FINDINGS: Segmentation: 5 lumbar type vertebral bodies. S1 has some transitional features.  Alignment: 2 mm degenerative anterolisthesis L4-5. Otherwise normal.  Vertebrae: No fracture or primary bone lesion. Previous diskectomy, decompression and fusion procedure at L5-S1 with solid union. Solid anterior and posterior fusion at T12-L1.  Paraspinal and other soft tissues: Negative except for aortic atherosclerosis.  Disc levels: T12-L1: Solid bridging osteophytes both anterior and posterior. Non-compressive narrowing of the canal and foramina.  L1-2: Annular bulging and calcification. Bilateral facet degeneration ligamentous calcification. Mild canal and foraminal stenosis but no likely neural compression  L2-3: Annular bulging and calcification. Facet and ligamentous hypertrophy with ligamentous calcification. Moderate multifactorial stenosis at this level which could possibly be symptomatic.  L3-4: Endplate osteophytes and bulging of the disc. Facet and ligamentous  hypertrophy with ligamentous calcification. Moderate stenosis of the canal and foramina which could possibly be symptomatic.  L4-5: Bilateral facet arthropathy with hypertrophic degenerative change and ligamentous calcification, allowing anterolisthesis of 2 mm. Bulging of the disc. Severe multifactorial stenosis at this level that could cause neural compression on either or both sides.  L5-S1: Distant posterior decompression, diskectomy and fusion procedure. Fusion is solid. Sufficient patency of the and foramina. Hardware complication.  S1-2: Transitional level. Wide patency of the and foramina.  Bilateral sacroiliac osteoarthritis.  IMPRESSION: 1. Good appearance at the L5-S1 level. Solid union. 2. L2-3: Moderate multifactorial stenosis which could possibly be symptomatic. 3. L3-4: Moderate multifactorial stenosis of the canal and foramina which could possibly be symptomatic. 4. L4-5: Severe multifactorial spinal stenosis likely to cause neural compression on either or both sides. 5. Bilateral sacroiliac  osteoarthritis.  Aortic Atherosclerosis (ICD10-I70.0).   Electronically Signed   By: Nelson Chimes M.D.   On: 12/24/2018 19:15    Hip-R DG 2-3 views:  Results for orders placed during the hospital encounter of 11/02/18  DG HIP UNILAT W OR W/O PELVIS 2-3 VIEWS RIGHT   Narrative CLINICAL DATA:  69 year old male status post right hip revision.  EXAM: DG HIP (WITH OR WITHOUT PELVIS) 2-3V RIGHT  COMPARISON:  CT pelvis 09/23/2018.  FINDINGS: AP and cross-table lateral views at 0958 hours. Bipolar hip arthroplasty is in place and appears normally located. Heterotopic ossification redemonstrated about the lateral right hip. No adverse hardware features. No acute osseous abnormality identified. Skin staples overlie the right hip. Partially visible lumbar spine hardware.  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   Electronically  Signed   By: Genevie Ann M.D.   On: 11/02/2018 10:21     Results for orders placed during the hospital encounter of 03/17/18  CT KNEE RIGHT WO CONTRAST   Narrative CLINICAL DATA:  Preop right knee replacement  EXAM: CT OF THE RIGHT KNEE WITHOUT CONTRAST  TECHNIQUE: Multidetector CT imaging of the RIGHT knee was performed according to the standard protocol. Multiplanar CT image reconstructions were also generated.  COMPARISON:  None.  FINDINGS: Bones/Joint/Cartilage  The hip demonstrates no fracture or dislocation. There is no lytic or blastic lesion. There is mild osteoarthritis of the right SI joint. 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 There is no hardware failure or complication.  The knee demonstrates no fracture or dislocation. There is no lytic or blastic lesion. There is mild patellofemoral compartment joint space narrowing with marginal osteophytes most severe in the medial femorotibial compartment. There is severe lateral femorotibial compartment joint space narrowing with marginal osteophytes and mild subchondral sclerosis. There is mild-moderate medial femorotibial compartment joint space narrowing with small marginal osteophytes. There is a small joint effusion.  The ankle demonstrates no fracture or dislocation. There is no lytic or blastic lesion.  Ligaments  Suboptimally assessed by CT.  Muscles and Tendons  The muscles are normal.  There is no muscle atrophy.  Soft tissues  There is no fluid collection or hematoma. There is no soft tissue mass.  IMPRESSION: 1. Tricompartmental osteoarthritis of the right knee.   Electronically Signed   By: Kathreen Devoid   On: 03/18/2018 08:01    Complexity Note: Imaging results reviewed. Results shared with Mr. Hardge, using Layman's terms.                         ROS  Cardiovascular: Daily Aspirin intake Pulmonary or Respiratory: Shortness of breath Neurological: No reported neurological signs or  symptoms such as seizures, abnormal skin sensations, urinary and/or fecal incontinence, being born with an abnormal open spine and/or a tethered spinal cord Psychological-Psychiatric: Anxiousness Gastrointestinal: Heartburn due to stomach pushing into lungs (Hiatal hernia) and Reflux or heatburn Genitourinary: Passing kidney stones Hematological: No reported hematological signs or symptoms such as prolonged bleeding, low or poor functioning platelets, bruising or bleeding easily, hereditary bleeding problems, low energy levels due to low hemoglobin or being anemic Endocrine: 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 Colavito  is allergic to levaquin [levofloxacin].  Laboratory Chemistry Profile   Renal Lab Results  Component Value Date   BUN 9 11/03/2018   CREATININE 0.80 11/03/2018   GFRAA >60 11/03/2018   GFRNONAA >60 11/03/2018   PROTEINUR NEGATIVE 10/26/2018    Electrolytes Lab Results  Component Value Date   NA 137 11/03/2018   K 5.1 11/03/2018   CL 104 11/03/2018   CALCIUM 8.6 (L) 11/03/2018    Hepatic No results found for: AST, ALT, ALBUMIN, ALKPHOS, AMYLASE, LIPASE, AMMONIA  ID Lab Results  Component Value Date   SARSCOV2NAA NEGATIVE 11/01/2018   STAPHAUREUS NEGATIVE 10/26/2018   MRSAPCR NEGATIVE 10/26/2018    Bone No results found for: VD25OH, UX323FT7DUK, GU5427CW2, BJ6283TD1, 25OHVITD1, 25OHVITD2, 25OHVITD3, TESTOFREE, TESTOSTERONE  Endocrine Lab Results  Component Value Date   GLUCOSE 147 (H) 11/03/2018   GLUCOSEU NEGATIVE 10/26/2018   HGBA1C 6.0 (H) 11/02/2018    Neuropathy Lab Results   Component Value Date   HGBA1C 6.0 (H) 11/02/2018    CNS No results found for: COLORCSF, APPEARCSF, RBCCOUNTCSF, WBCCSF, POLYSCSF, LYMPHSCSF, EOSCSF, PROTEINCSF, GLUCCSF, JCVIRUS, CSFOLI, IGGCSF, LABACHR, ACETBL, LABACHR, ACETBL  Inflammation (CRP: Acute  ESR: Chronic) Lab Results  Component Value Date   ESRSEDRATE 22 (H) 10/26/2018    Rheumatology No results found for: RF, ANA, LABURIC, URICUR, LYMEIGGIGMAB, LYMEABIGMQN, HLAB27  Coagulation Lab Results  Component Value Date   INR 1.1 10/26/2018   LABPROT 13.7 10/26/2018   APTT 36 10/26/2018   PLT 176 11/03/2018    Cardiovascular Lab Results  Component Value Date   HGB 12.6 (L) 11/03/2018   HCT 38.2 (L) 11/03/2018    Screening Lab Results  Component Value Date   SARSCOV2NAA NEGATIVE 11/01/2018   STAPHAUREUS NEGATIVE 10/26/2018   MRSAPCR NEGATIVE 10/26/2018    Cancer No results found for: CEA, CA125, LABCA2  Allergens No results found for: ALMOND, APPLE, ASPARAGUS, AVOCADO, BANANA, BARLEY, BASIL, BAYLEAF, GREENBEAN, LIMABEAN, WHITEBEAN, BEEFIGE, REDBEET, BLUEBERRY, BROCCOLI, CABBAGE, MELON, CARROT, CASEIN, CASHEWNUT, CAULIFLOWER, CELERY    Note: Lab results reviewed.   [place]  Drug: Mr. Gleaves  reports no history of drug use. Alcohol:  reports previous alcohol use. Tobacco:  reports that he has never smoked. He has never used smokeless tobacco. Medical:  has a past medical history of Anginal pain ([place]), Arthritis, Back pain, Claustrophobia, Depression, Diabetes mellitus without complication ([place]), Dyspnea, Elevated lipids, Elevated lipids, Environmental and seasonal allergies, Fatty liver, GERD (gastroesophageal reflux disease), Glaucoma, Hepatitis, HH (hiatus hernia), Hip pain (Right), History of kidney stones, Knee pain, Panic attacks, and Sleep apnea. Family: family history is not on file.  Past Surgical History:  Procedure Laterality Date  . BACK SURGERY     L4-L5 fusion  . BONE MARROW TRANSPLANT    . COLONOSCOPY  WITH PROPOFOL N/A 04/13/2018   Procedure: COLONOSCOPY WITH PROPOFOL;  Surgeon: Toledo, Benay Pike, MD;  Location: [facility] END[facility]COPY;  Service: Gastroenterology;  Laterality: N/A;  . ESOPHAGOGASTRODUODENOSCOPY (EGD) WITH PROPOFOL N/A 04/13/2018   Procedure: ESOPHAGOGASTRODUODENOSCOPY (EGD) WITH PROPOFOL;  Surgeon: Toledo, Benay Pike, MD;  Location: [facility] ENDOSCOPY;  Service: Gastroenterology;  Laterality: N/A;  . JOINT REPLACEMENT    . REPLACEMENT TOTAL KNEE Left   . TOTAL HIP ARTHROPLASTY Right 01/26/2018   Procedure: TOTAL HIP ARTHROPLASTY ANTERIOR APPROACH;  Surgeon: Hessie Knows, MD;  Location: [facility] ORS;  Service: Orthopedics;  Laterality: Right;  . TOTAL HIP REVISION Right 11/02/2018   Procedure: TOTAL HIP REVISION WITH EXCISION OF HETEROTOPIC OSSIFICATION;  Surgeon: Hessie Knows, MD;  Location:  [facility] ORS;  Service: Orthopedics;  Laterality: Right;   Active Ambulatory Problems    Diagnosis Date Noted  . Status post total hip replacement, right 01/26/2018  . 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 SI joint arthritis 05/16/2019  . Sacroiliac joint pain 05/16/2019  . History of lumbar fusion  (Hx of L5/S1 in 2000) 05/16/2019  . Spinal stenosis, lumbar region, with neurogenic claudication 05/16/2019  . Lumbar radiculopathy 05/16/2019  . Lumbar facet arthropathy 05/16/2019  . Chronic pain syndrome 05/16/2019   Resolved Ambulatory Problems    Diagnosis Date Noted  . No Resolved Ambulatory Problems   Past Medical History:  Diagnosis Date  . Anginal pain ([place])   . Arthritis   . Back pain   . Claustrophobia   . Depression   . Diabetes mellitus without complication ([place])   . Dyspnea   . Elevated lipids   . Elevated lipids   . Environmental and seasonal allergies   . Fatty liver   . GERD (gastroesophageal reflux disease)   . Glaucoma   . Hepatitis   . HH (hiatus hernia)   . Hip pain Right  . History of kidney stones   . Knee pain   . Panic attacks   . Sleep apnea    Constitutional  Exam  General appearance: Well nourished, well developed, and well hydrated. In no apparent acute distress Vitals:   05/16/19 1009  BP: 137/70  Pulse: 75  Resp: 18  Temp: (!) 97.3 F (36.3 C)  TempSrc: Temporal  SpO2: 100%  Weight: 292 lb (132.5 kg)  Height: '5\' 9"'  (1.753 m)   BMI Assessment: Estimated body mass index is 43.12 kg/m as calculated from the following:   Height as of this encounter: '5\' 9"'  (1.753 m).   Weight as of this encounter: 292 lb (132.5 kg).  BMI interpretation table: BMI level Category Range association with higher incidence of chronic pain  <18 kg/m2 Underweight   18.5-24.9 kg/m2 Ideal body weight   25-29.9 kg/m2 Overweight Increased incidence by 20%  30-34.9 kg/m2 Obese (Class I) Increased incidence by 68%  35-39.9 kg/m2 Severe obesity (Class II) Increased incidence by 136%  >40 kg/m2 Extreme obesity (Class III) Increased incidence by 254%   Patient's current BMI Ideal Body weight  Body mass index is 43.12 kg/m. Ideal body weight: 70.7 kg (155 lb 13.8 oz) Adjusted ideal body weight: 95.4 kg (210 lb 5.1 oz)   BMI Readings from Last 4 Encounters:  05/16/19 43.12 kg/m  11/02/18 42.00 kg/m  10/26/18 42.09 kg/m  10/05/18 41.32 kg/m   Wt Readings from Last 4 Encounters:  05/16/19 292 lb (132.5 kg)  11/02/18 284 lb 6.3 oz (129 kg)  10/26/18 285 lb (129.3 kg)  10/05/18 279 lb 12.8 oz (126.9 kg)    Psych/Mental status: Alert, oriented x 3 (person, place, & time)       Eyes: PERLA Respiratory: No evidence of acute respiratory distress  Cervical Spine Exam  Skin & Axial Inspection: No masses, redness, edema, swelling, or associated skin lesions Alignment: Symmetrical Functional ROM: Unrestricted ROM      Stability: No instability detected Muscle Tone/Strength: Functionally intact. No obvious neuro-muscular anomalies detected. Sensory (Neurological): Musculoskeletal pain pattern Palpation: No palpable anomalies              Upper Extremity (UE)  Exam    Side: Right upper extremity  Side: Left upper extremity  Skin & Extremity Inspection: Skin color, temperature, and hair growth are WNL. No peripheral  edema or cyanosis. No masses, redness, swelling, asymmetry, or associated skin lesions. No contractures.  Skin & Extremity Inspection: Skin color, temperature, and hair growth are WNL. No peripheral edema or cyanosis. No masses, redness, swelling, asymmetry, or associated skin lesions. No contractures.  Functional ROM: Decreased ROM          Functional ROM: Decreased ROM          Muscle Tone/Strength: Functionally intact. No obvious neuro-muscular anomalies detected.  Muscle Tone/Strength: Functionally intact. No obvious neuro-muscular anomalies detected.  Sensory (Neurological): Unimpaired          Sensory (Neurological): Unimpaired          Palpation: No palpable anomalies              Palpation: No palpable anomalies              Provocative Test(s):  Phalen's test: deferred Tinel's test: deferred Apley's scratch test (touch opposite shoulder):  Action 1 (Across chest): Decreased ROM Action 2 (Overhead): Decreased ROM Action 3 (LB reach): Decreased ROM   Provocative Test(s):  Phalen's test: deferred Tinel's test: deferred Apley's scratch test (touch opposite shoulder):  Action 1 (Across chest): Decreased ROM Action 2 (Overhead): Decreased ROM Action 3 (LB reach): Decreased ROM    Thoracic Spine Area Exam  Skin & Axial Inspection: No masses, redness, or swelling Alignment: Symmetrical Functional ROM: Pain restricted ROM Stability: No instability detected Muscle Tone/Strength: Functionally intact. No obvious neuro-muscular anomalies detected. Sensory (Neurological): Musculoskeletal pain pattern Muscle strength & Tone: No palpable anomalies  Lumbar Exam  Skin & Axial Inspection: No masses, redness, or swelling Alignment: Symmetrical Functional ROM: Decreased ROM       Stability: No instability detected Muscle Tone/Strength:  Functionally intact. No obvious neuro-muscular anomalies detected. Sensory (Neurological): Musculoskeletal pain pattern Palpation: No palpable anomalies       Provocative Tests: Hyperextension/rotation test: (+) bilaterally for facet joint pain. Lumbar quadrant test (Kemp's test): (+) bilaterally for facet joint pain. Lateral bending test: (+) ipsilateral radicular pain, bilaterally. Positive for bilateral foraminal stenosis. Patrick's Maneuver: (+) for bilateral S-I arthralgia             FABER* test: (+) for bilateral S-I arthralgia and for right hip arthralgia S-I anterior distraction/compression test: (+)   S-I arthralgia/arthropathy S-I lateral compression test: deferred today         S-I Thigh-thrust test: (+)   S-I arthralgia/arthropathy S-I Gaenslen's test: deferred today         *(Flexion, ABduction and External Rotation)  Gait & Posture Assessment  Ambulation: Limited Gait: Antalgic Posture: Difficulty standing up straight, due to pain   Lower Extremity Exam    Side: Right lower extremity  Side: Left lower extremity  Stability: No instability observed          Stability: No instability observed          Skin & Extremity Inspection: Skin color, temperature, and hair growth are WNL. No peripheral edema or cyanosis. No masses, redness, swelling, asymmetry, or associated skin lesions. No contractures.  Skin & Extremity Inspection: Skin color, temperature, and hair growth are WNL. No peripheral edema or cyanosis. No masses, redness, swelling, asymmetry, or associated skin lesions. No contractures.  Functional ROM: Pain restricted ROM                  Functional ROM: Pain restricted ROM                  Muscle Tone/Strength: Functionally  intact. No obvious neuro-muscular anomalies detected.  Muscle Tone/Strength: Functionally intact. No obvious neuro-muscular anomalies detected.  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 Diagnoses of Sacroiliac joint pain, Status post total hip replacement, right, S/P revision of total hip, History of lumbar fusion  (Hx of L5/S1 in 2000), Spinal stenosis, lumbar region, with neurogenic claudication, Lumbar facet arthropathy, Chronic radicular lumbar pain, Lumbar radiculopathy, and Chronic pain syndrome were also pertinent to this visit.  Visit Diagnosis (New problems to examiner): 1. SI joint arthritis   2. Sacroiliac joint pain   3. Status post total hip replacement, right   4. S/P revision of total hip   5. History of lumbar fusion  (Hx of L5/S1 in 2000)   6. Spinal stenosis, lumbar region, with neurogenic claudication   7. Lumbar facet arthropathy   8. Chronic radicular lumbar pain   9. Lumbar radiculopathy   10. Chronic pain syndrome    Pain is multifactorial but is likely related to his severe lumbar spinal stenosis most pronounced at L4-L5 along with bilateral SI joint arthritis.  Would like to start with diagnostic bilateral SI joint injections.  Patient's CT of lumbar spine show significant bilateral SI joint arthritis.  If this is not effective can consider diagnostic lumbar epidural steroid injection at L4-L5.  Patient would like to avoid any medication management at this time.  Plan of Care (Initial workup plan)   Procedure Orders     SACROILIAC JOINT INJECTION  Interventional management options: Mr. Fofana was informed that there is no guarantee that he would be a candidate for interventional therapies. The decision will be based on the results of diagnostic studies, as well as Mr. Dubuc risk profile.   Procedure(s) under consideration:  Diagnostic SI joint injection Diagnostic L4-L5 epidural steroid injection Diagnostic lumbar facet medial branch nerve blocks at L2-L3, L3-L4, L4-L5 Possible spinal cord stimulator trial (evaluate interlaminar windows, facet arthrosis at L1-L2, L2-L3 along with moderate spinal canal stenosis at L2-L3)    Provider-requested follow-up: Return in about 1 week (around 05/23/2019) for Bilateral SI joint block, with sedation.  Future Appointments  Date Time Provider [place]  05/23/2019 10:00 AM Gillis Santa, MD [facility] None    Note by: Gillis Santa, MD Date: 05/16/2019; Time: 11:04 AM

## 2019-05-16 NOTE — Patient Instructions (Signed)
____________________________________________________________________________________________  General Risks and Possible Complications  Patient Responsibilities: It is important that you read this as it is part of your informed consent. It is our duty to inform you of the risks and possible complications associated with treatments offered to you. It is your responsibility as a patient to read this and to ask questions about anything that is not clear or that you believe was not covered in this document.  Patient's Rights: You have the right to refuse treatment. You also have the right to change your mind, even after initially having agreed to have the treatment done. However, under this last option, if you wait until the last second to change your mind, you may be charged for the materials used up to that point.  Introduction: Medicine is not an exact science. Everything in Medicine, including the lack of treatment(s), carries the potential for danger, harm, or loss (which is by definition: Risk). In Medicine, a complication is a secondary problem, condition, or disease that can aggravate an already existing one. All treatments carry the risk of possible complications. The fact that a side effects or complications occurs, does not imply that the treatment was conducted incorrectly. It must be clearly understood that these can happen even when everything is done following the highest safety standards.  No treatment: You can choose not to proceed with the proposed treatment alternative. The "PRO(s)" would include: avoiding the risk of complications associated with the therapy. The "CON(s)" would include: not getting any of the treatment benefits. These benefits fall under one of three categories: diagnostic; therapeutic; and/or palliative. Diagnostic benefits include: getting information which can ultimately lead to improvement of the disease or symptom(s). Therapeutic benefits are those associated with the  successful treatment of the disease. Finally, palliative benefits are those related to the decrease of the primary symptoms, without necessarily curing the condition (example: decreasing the pain from a flare-up of a chronic condition, such as incurable terminal cancer).  General Risks and Complications: These are associated to most interventional treatments. They can occur alone, or in combination. They fall under one of the following six (6) categories: no benefit or worsening of symptoms; bleeding; infection; nerve damage; allergic reactions; and/or death. 1. No benefits or worsening of symptoms: In Medicine there are no guarantees, only probabilities. No healthcare provider can ever guarantee that a medical treatment will work, they can only state the probability that it may. Furthermore, there is always the possibility that the condition may worsen, either directly, or indirectly, as a consequence of the treatment. 2. Bleeding: This is more common if the patient is taking a blood thinner, either prescription or over the counter (example: Goody Powders, Fish oil, Aspirin, Garlic, etc.), or if suffering a condition associated with impaired coagulation (example: Hemophilia, cirrhosis of the liver, low platelet counts, etc.). However, even if you do not have one on these, it can still happen. If you have any of these conditions, or take one of these drugs, make sure to notify your treating physician. 3. Infection: This is more common in patients with a compromised immune system, either due to disease (example: diabetes, cancer, human immunodeficiency virus [HIV], etc.), or due to medications or treatments (example: therapies used to treat cancer and rheumatological diseases). However, even if you do not have one on these, it can still happen. If you have any of these conditions, or take one of these drugs, make sure to notify your treating physician. 4. Nerve Damage: This is more common when the   treatment is  an invasive one, but it can also happen with the use of medications, such as those used in the treatment of cancer. The damage can occur to small secondary nerves, or to large primary ones, such as those in the spinal cord and brain. This damage may be temporary or permanent and it may lead to impairments that can range from temporary numbness to permanent paralysis and/or brain death. 5. Allergic Reactions: Any time a substance or material comes in contact with our body, there is the possibility of an allergic reaction. These can range from a mild skin rash (contact dermatitis) to a severe systemic reaction (anaphylactic reaction), which can result in death. 6. Death: In general, any medical intervention can result in death, most of the time due to an unforeseen complication. ____________________________________________________________________________________________  ____________________________________________________________________________________________  Preparing for Procedure with Sedation  Procedure appointments are limited to planned procedures: . No Prescription Refills. . No disability issues will be discussed. . No medication changes will be discussed.  Instructions: . Oral Intake: Do not eat or drink anything for at least 8 hours prior to your procedure. . Transportation: Public transportation is not allowed. Bring an adult driver. The driver must be physically present in our waiting room before any procedure can be started. Marland Kitchen Physical Assistance: Bring an adult physically capable of assisting you, in the event you need help. This adult should keep you company at home for at least 6 hours after the procedure. . Blood Pressure Medicine: Take your blood pressure medicine with a sip of water the morning of the procedure. . Blood thinners: Notify our staff if you are taking any blood thinners. Depending on which one you take, there will be specific instructions on how and when to stop  it. . Diabetics on insulin: Notify the staff so that you can be scheduled 1st case in the morning. If your diabetes requires high dose insulin, take only  of your normal insulin dose the morning of the procedure and notify the staff that you have done so. . Preventing infections: Shower with an antibacterial soap the morning of your procedure. . Build-up your immune system: Take 1000 mg of Vitamin C with every meal (3 times a day) the day prior to your procedure. Marland Kitchen Antibiotics: Inform the staff if you have a condition or reason that requires you to take antibiotics before dental procedures. . Pregnancy: If you are pregnant, call and cancel the procedure. . Sickness: If you have a cold, fever, or any active infections, call and cancel the procedure. . Arrival: You must be in the facility at least 30 minutes prior to your scheduled procedure. . Children: Do not bring children with you. . Dress appropriately: Bring dark clothing that you would not mind if they get stained. . Valuables: Do not bring any jewelry or valuables.  Reasons to call and reschedule or cancel your procedure: (Following these recommendations will minimize the risk of a serious complication.) . Surgeries: Avoid having procedures within 2 weeks of any surgery. (Avoid for 2 weeks before or after any surgery). . Flu Shots: Avoid having procedures within 2 weeks of a flu shots or . (Avoid for 2 weeks before or after immunizations). . Barium: Avoid having a procedure within 7-10 days after having had a radiological study involving the use of radiological contrast. (Myelograms, Barium swallow or enema study). . Heart attacks: Avoid any elective procedures or surgeries for the initial 6 months after a "Myocardial Infarction" (Heart Attack). . Blood thinners: It  is imperative that you stop these medications before procedures. Let us know if you if you take any blood thinner.  . Infection: Avoid procedures during or within two weeks of an  infection (including chest colds or gastrointestinal problems). Symptoms associated with infections include: Localized redness, fever, chills, night sweats or profuse sweating, burning sensation when voiding, cough, congestion, stuffiness, runny nose, sore throat, diarrhea, nausea, vomiting, cold or Flu symptoms, recent or current infections. It is specially important if the infection is over the area that we intend to treat. Marland Kitchen Heart and lung problems: Symptoms that may suggest an active cardiopulmonary problem include: cough, chest pain, breathing difficulties or shortness of breath, dizziness, ankle swelling, uncontrolled high or unusually low blood pressure, and/or palpitations. If you are experiencing any of these symptoms, cancel your procedure and contact your primary care physician for an evaluation.  Remember:  Regular Business hours are:  Monday to Thursday 8:00 AM to 4:00 PM  Provider's Schedule: Milinda Pointer, MD:  Procedure days: Tuesday and Thursday 7:30 AM to 4:00 PM  Gillis Santa, MD:  Procedure days: Monday and Wednesday 7:30 AM to 4:00 PM ____________________________________________________________________________________________  Sacroiliac (SI) Joint Injection Patient Information  Description: The sacroiliac joint connects the scrum (very low back and tailbone) to the ilium (a pelvic bone which also forms half of the hip joint).  Normally this joint experiences very little motion.  When this joint becomes inflamed or unstable low back and or hip and pelvis pain may result.  Injection of this joint with local anesthetics (numbing medicines) and steroids can provide diagnostic information and reduce pain.  This injection is performed with the aid of x-ray guidance into the tailbone area while you are lying on your stomach.   You may experience an electrical sensation down the leg while this is being done.  You may also experience numbness.  We also may ask if we are  reproducing your normal pain during the injection.  Conditions which may be treated SI injection:   Low back, buttock, hip or leg pain  Preparation for the Injection:  1. Do not eat any solid food or dairy products within 8 hours of your appointment.  2. You may drink clear liquids up to 3 hours before appointment.  Clear liquids include water, black coffee, juice or soda.  No milk or cream please. 3. You may take your regular medications, including pain medications with a sip of water before your appointment.  Diabetics should hold regular insulin (if take separately) and take 1/2 normal NPH dose the morning of the procedure.  Carry some sugar containing items with you to your appointment. 4. A driver must accompany you and be prepared to drive you home after your procedure. 5. Bring all of your current medications with you. 6. An IV may be inserted and sedation may be given at the discretion of the physician. 7. A blood pressure cuff, EKG and other monitors will often be applied during the procedure.  Some patients may need to have extra oxygen administered for a short period.  8. You will be asked to provide medical information, including your allergies, prior to the procedure.  We must know immediately if you are taking blood thinners (like Coumadin/Warfarin) or if you are allergic to IV iodine contrast (dye).  We must know if you could possible be pregnant.  Possible side effects:   Bleeding from needle site  Infection (rare, may require surgery)  Nerve injury (rare)  Numbness & tingling (temporary)  A brief convulsion or seizure  Light-headedness (temporary)  Pain at injection site (several days)  Decreased blood pressure (temporary)  Weakness in the leg (temporary)   Call if you experience:   New onset weakness or numbness of an extremity below the injection site that last more than 8 hours.  Hives or difficulty breathing ( go to the emergency room)  Inflammation  or drainage at the injection site  Any new symptoms which are concerning to you  Please note:  Although the local anesthetic injected can often make your back/ hip/ buttock/ leg feel good for several hours after the injections, the pain will likely return.  It takes 3-7 days for steroids to work in the sacroiliac area.  You may not notice any pain relief for at least that one week.  If effective, we will often do a series of three injections spaced 3-6 weeks apart to maximally decrease your pain.  After the initial series, we generally will wait some months before a repeat injection of the same type.  If you have any questions, please call [phone] [facility]

## 2019-05-17 ENCOUNTER — Ambulatory Visit: Payer: PRIVATE HEALTH INSURANCE | Admitting: Student in an Organized Health Care Education/Training Program

## 2019-05-23 ENCOUNTER — Encounter: Payer: Self-pay | Admitting: Student in an Organized Health Care Education/Training Program

## 2019-05-23 ENCOUNTER — Ambulatory Visit
Admission: RE | Admit: 2019-05-23 | Discharge: 2019-05-23 | Disposition: A | Discharge status: Home / Self Care | Payer: Medicare Other | Source: Ambulatory Visit | Attending: Student in an Organized Health Care Education/Training Program | Admitting: Student in an Organized Health Care Education/Training Program

## 2019-05-23 ENCOUNTER — Other Ambulatory Visit: Payer: Self-pay

## 2019-05-23 ENCOUNTER — Ambulatory Visit (HOSPITAL_BASED_OUTPATIENT_CLINIC_OR_DEPARTMENT_OTHER): Payer: Medicare Other | Admitting: Student in an Organized Health Care Education/Training Program

## 2019-05-23 VITALS — BP 133/70 | HR 70 | Temp 97.3°F | Resp 12 | Ht 69.0 in | Wt 292.0 lb

## 2019-05-23 DIAGNOSIS — Z881 Allergy status to other antibiotic agents status: Secondary | ICD-10-CM | POA: Diagnosis not present

## 2019-05-23 DIAGNOSIS — Z7984 Long term (current) use of oral hypoglycemic drugs: Secondary | ICD-10-CM | POA: Diagnosis not present

## 2019-05-23 DIAGNOSIS — M461 Sacroiliitis, not elsewhere classified: Secondary | ICD-10-CM | POA: Insufficient documentation

## 2019-05-23 DIAGNOSIS — M47818 Spondylosis without myelopathy or radiculopathy, sacral and sacrococcygeal region: Secondary | ICD-10-CM | POA: Diagnosis not present

## 2019-05-23 DIAGNOSIS — M533 Sacrococcygeal disorders, not elsewhere classified: Secondary | ICD-10-CM | POA: Diagnosis not present

## 2019-05-23 DIAGNOSIS — Z9481 Bone marrow transplant status: Secondary | ICD-10-CM | POA: Insufficient documentation

## 2019-05-23 DIAGNOSIS — Z96652 Presence of left artificial knee joint: Secondary | ICD-10-CM | POA: Diagnosis not present

## 2019-05-23 DIAGNOSIS — M25551 Pain in right hip: Secondary | ICD-10-CM | POA: Diagnosis present

## 2019-05-23 DIAGNOSIS — Z7982 Long term (current) use of aspirin: Secondary | ICD-10-CM | POA: Diagnosis not present

## 2019-05-23 DIAGNOSIS — Z79899 Other long term (current) drug therapy: Secondary | ICD-10-CM | POA: Insufficient documentation

## 2019-05-23 DIAGNOSIS — Z96641 Presence of right artificial hip joint: Secondary | ICD-10-CM | POA: Diagnosis not present

## 2019-05-23 MED ORDER — ROPIVACAINE HCL 2 MG/ML IJ SOLN
INTRAMUSCULAR | Status: AC
  Filled 2019-05-23: qty 10

## 2019-05-23 MED ORDER — DEXAMETHASONE SODIUM PHOSPHATE 10 MG/ML IJ SOLN
INTRAMUSCULAR | Status: AC
  Filled 2019-05-23: qty 2

## 2019-05-23 MED ORDER — IOHEXOL 180 MG/ML  SOLN
10.0000 mL | Freq: Once | INTRAMUSCULAR | Status: AC
  Administered 2019-05-23: 10 mL via EPIDURAL
  Filled 2019-05-23: qty 20

## 2019-05-23 MED ORDER — LIDOCAINE HCL 2 % IJ SOLN
20.0000 mL | Freq: Once | INTRAMUSCULAR | Status: AC
  Administered 2019-05-23: 400 mg
  Filled 2019-05-23: qty 20

## 2019-05-23 MED ORDER — ROPIVACAINE HCL 2 MG/ML IJ SOLN
9.0000 mL | Freq: Once | INTRAMUSCULAR | Status: AC
  Administered 2019-05-23: 11:00:00 9 mL via PERINEURAL

## 2019-05-23 MED ORDER — MIDAZOLAM HCL 5 MG/5ML IJ SOLN
1.0000 mg | INTRAMUSCULAR | Status: DC | PRN
  Administered 2019-05-23: 11:00:00 0.5 mg via INTRAVENOUS
  Filled 2019-05-23: qty 5

## 2019-05-23 MED ORDER — DIAZEPAM 5 MG PO TABS
5.0000 mg | ORAL_TABLET | Freq: Once | ORAL | Status: AC
  Administered 2019-05-23: 5 mg via ORAL

## 2019-05-23 MED ORDER — DEXAMETHASONE SODIUM PHOSPHATE 10 MG/ML IJ SOLN
10.0000 mg | Freq: Once | INTRAMUSCULAR | Status: AC
  Administered 2019-05-23: 10 mg

## 2019-05-23 MED ORDER — DIAZEPAM 5 MG PO TABS
ORAL_TABLET | ORAL | Status: AC
  Filled 2019-05-23: qty 1

## 2019-05-23 MED ORDER — FENTANYL CITRATE (PF) 100 MCG/2ML IJ SOLN
25.0000 ug | INTRAMUSCULAR | Status: AC | PRN
  Administered 2019-05-23 (×2): 50 ug via INTRAVENOUS
  Filled 2019-05-23: qty 2

## 2019-05-23 NOTE — Progress Notes (Signed)
Safety precautions to be maintained throughout the outpatient stay will include: orient to surroundings, keep bed in low position, maintain call bell within reach at all times, provide assistance with transfer out of bed and ambulation.  

## 2019-05-23 NOTE — Progress Notes (Signed)
PROVIDER NOTE: Information contained herein reflects review and annotations entered in association with encounter. Interpretation of such information and data should be left to medically-trained personnel. Information provided to patient can be located elsewhere in the medical record under "Patient Instructions". Document created using STT-dictation technology, any transcriptional errors that may result from process are unintentional.    Patient: Mario [name]  Service Category: Procedure  Provider: Gillis Santa, MD  DOB: 08-Mar-1951  DOS: 05/23/2019  Location: [facility]  MRN: [id number]  Setting: Ambulatory - outpatient  Referring Provider: Baxter Hire, MD  Type: Established Patient  Specialty: Interventional Pain Management  PCP: Baxter Hire, MD   Primary Reason for Visit: Interventional Pain Management Treatment. CC: Hip Pain (bilateral )  Procedure:          Anesthesia, Analgesia, Anxiolysis:  Type: Diagnostic Sacroiliac Joint Steroid Injection #1  Region: Inferior Lumbosacral Region Level: PIIS (Posterior Inferior Iliac Spine) Laterality: Bilateral  Type: Moderate (Conscious) Sedation combined with Local Anesthesia Indication(s): Analgesia and Anxiety Route: Intravenous (IV) IV Access: Secured Sedation: Meaningful verbal contact was maintained at all times during the procedure  Local Anesthetic: Lidocaine 1-2%  Position: Prone           Indications: 1. SI joint arthritis   2. Sacroiliac joint pain    Pain Score: Pre-procedure: 7 /10 Post-procedure: 2 /10   Pre-op Assessment:  Mario [name] is a 69 y.o. (year old), male patient, seen today for interventional treatment. He  has a past surgical history that includes Back surgery; Replacement total knee (Left); Bone marrow transplant; Total hip arthroplasty (Right, 01/26/2018); Colonoscopy with propofol (N/A, 04/13/2018); Esophagogastroduodenoscopy (egd) with propofol (N/A, 04/13/2018); Joint replacement; and Total  hip revision (Right, 11/02/2018). Mario [name] has a current medication list which includes the following prescription(s): bayer aspirin ec low dose, calcium-magnesium-zinc, cholecalciferol, cyanocobalamin, glimepiride, magnesium, metformin, pravastatin, trazodone, and vitamin c, and the following Facility-Administered Medications: midazolam. His primarily concern today is the Hip Pain (bilateral )  Initial Vital Signs:  Pulse/HCG Rate: 72ECG Heart Rate: 68 Temp: (!) 97.3 F (36.3 C) Resp: 16 BP: (!) 144/67 SpO2: 100 %  BMI: Estimated body mass index is 43.12 kg/m as calculated from the following:   Height as of this encounter: 5\' 9"  (1.753 m).   Weight as of this encounter: 292 lb (132.5 kg).  Risk Assessment: Allergies: Reviewed. He is allergic to levaquin [levofloxacin].  Allergy Precautions: None required Coagulopathies: Reviewed. None identified.  Blood-thinner therapy: None at this time Active Infection(s): Reviewed. None identified. Mario [name] is afebrile  Site Confirmation: Mario [name] was asked to confirm the procedure and laterality before marking the site Procedure checklist: Completed Consent: Before the procedure and under the influence of no sedative(s), amnesic(s), or anxiolytics, the patient was informed of the treatment options, risks and possible complications. To fulfill our ethical and legal obligations, as recommended by the American Medical Association's Code of Ethics, I have informed the patient of my clinical impression; the nature and purpose of the treatment or procedure; the risks, benefits, and possible complications of the intervention; the alternatives, including doing nothing; the risk(s) and benefit(s) of the alternative treatment(s) or procedure(s); and the risk(s) and benefit(s) of doing nothing. The patient was provided information about the general risks and possible complications associated with the procedure. These may include, but are not limited to: failure  to achieve desired goals, infection, bleeding, organ or nerve damage, allergic reactions, paralysis, and death. In addition, the patient was informed of those  risks and complications associated to the procedure, such as failure to decrease pain; infection; bleeding; organ or nerve damage with subsequent damage to sensory, motor, and/or autonomic systems, resulting in permanent pain, numbness, and/or weakness of one or several areas of the body; allergic reactions; (i.e.: anaphylactic reaction); and/or death. Furthermore, the patient was informed of those risks and complications associated with the medications. These include, but are not limited to: allergic reactions (i.e.: anaphylactic or anaphylactoid reaction(s)); adrenal axis suppression; blood sugar elevation that in diabetics may result in ketoacidosis or comma; water retention that in patients with history of congestive heart failure may result in shortness of breath, pulmonary edema, and decompensation with resultant heart failure; weight gain; swelling or edema; medication-induced neural toxicity; particulate matter embolism and blood vessel occlusion with resultant organ, and/or nervous system infarction; and/or aseptic necrosis of one or more joints. Finally, the patient was informed that Medicine is not an exact science; therefore, there is also the possibility of unforeseen or unpredictable risks and/or possible complications that may result in a catastrophic outcome. The patient indicated having understood very clearly. We have given the patient no guarantees and we have made no promises. Enough time was given to the patient to ask questions, all of which were answered to the patient's satisfaction. Mario [name] has indicated that he wanted to continue with the procedure. Attestation: I, the ordering provider, attest that I have discussed with the patient the benefits, risks, side-effects, alternatives, likelihood of achieving goals, and potential  problems during recovery for the procedure that I have provided informed consent. Date  Time: 05/23/2019  9:42 AM  Pre-Procedure Preparation:  Monitoring: As per clinic protocol. Respiration, ETCO2, SpO2, BP, heart rate and rhythm monitor placed and checked for adequate function Safety Precautions: Patient was assessed for positional comfort and pressure points before starting the procedure. Time-out: I initiated and conducted the "Time-out" before starting the procedure, as per protocol. The patient was asked to participate by confirming the accuracy of the "Time Out" information. Verification of the correct person, site, and procedure were performed and confirmed by me, the nursing staff, and the patient. "Time-out" conducted as per Joint Commission's Universal Protocol (UP.01.01.01). Time: 1043  Description of Procedure:          Target Area: Inferior, posterior, aspect of the sacroiliac fissure Approach: Posterior, paraspinal, ipsilateral approach. Area Prepped: Entire Lower Lumbosacral Region Prepping solution: DuraPrep (Iodine Povacrylex [0.7% available iodine] and Isopropyl Alcohol, 74% w/w) Safety Precautions: Aspiration looking for blood return was conducted prior to all injections. At no point did we inject any substances, as a needle was being advanced. No attempts were made at seeking any paresthesias. Safe injection practices and needle disposal techniques used. Medications properly checked for expiration [name]. SDV (single dose vial) medications used. Description of the Procedure: Protocol guidelines were followed. The patient was placed in position over the procedure table. The target area was identified and the area prepped in the usual manner. Skin & deeper tissues infiltrated with local anesthetic. Appropriate amount of time allowed to pass for local anesthetics to take effect. The procedure needle was advanced under fluoroscopic guidance into the sacroiliac joint until a firm  endpoint was obtained. Proper needle placement secured. Negative aspiration confirmed. Solution injected in intermittent fashion, asking for systemic symptoms every 0.5cc of injectate. The needles were then removed and the area cleansed, making sure to leave some of the prepping solution back to take advantage of its long term bactericidal properties. Vitals:   05/23/19 1055  05/23/19 1105 05/23/19 1115 05/23/19 1125  BP: 118/71 (!) 142/65 (!) 130/50 133/70  Pulse: 70     Resp: 18 16 16 12   Temp:      TempSrc:      SpO2: 97% 93% 96% 97%  Weight:      Height:        Start Time: 1043 hrs. End Time: 1053 hrs. Materials:  Needle(s) Type: Spinal Needle Gauge: 22G Length: 3.5-in Medication(s): Please see orders for medications and dosing details. 5 cc solution made of 4 cc of 0.2% ropivacaine, 1 cc of Decadron 10 mg/cc.  2.5 cc injected intra-articular, 2.5 cc injected periarticular for right SI joint 5 cc solution made of 4 cc of 0.2% ropivacaine, 1 cc of Decadron 10 mg/cc.  2.5 cc injected intra-articular, 2.5 cc injected periarticular for left SI joint Total steroid dose equals 20 mg Decadron Imaging Guidance (Non-Spinal):          Type of Imaging Technique: Fluoroscopy Guidance (Non-Spinal) Indication(s): Assistance in needle guidance and placement for procedures requiring needle placement in or near specific anatomical locations not easily accessible without such assistance. Exposure Time: Please see nurses notes. Contrast: Before injecting any contrast, we confirmed that the patient did not have an allergy to iodine, shellfish, or radiological contrast. Once satisfactory needle placement was completed at the desired level, radiological contrast was injected. Contrast injected under live fluoroscopy. No contrast complications. See chart for type and volume of contrast used. Fluoroscopic Guidance: I was personally present during the use of fluoroscopy. "Tunnel Vision Technique" used to obtain  the best possible view of the target area. Parallax error corrected before commencing the procedure. "Direction-depth-direction" technique used to introduce the needle under continuous pulsed fluoroscopy. Once target was reached, antero-posterior, oblique, and lateral fluoroscopic projection used confirm needle placement in all planes. Images permanently stored in EMR. Interpretation: I personally interpreted the imaging intraoperatively. Adequate needle placement confirmed in multiple planes. Appropriate spread of contrast into desired area was observed. No evidence of afferent or efferent intravascular uptake. Permanent images saved into the patient's record.  Antibiotic Prophylaxis:   Anti-infectives (From admission, onward)   None     Indication(s): None identified  Post-operative Assessment:  Post-procedure Vital Signs:  Pulse/HCG Rate: 7060 Temp: (!) 97.3 F (36.3 C) Resp: 12 BP: 133/70 SpO2: 97 %  EBL: None  Complications: No immediate post-treatment complications observed by team, or reported by patient.  Note: The patient tolerated the entire procedure well. A repeat set of vitals were taken after the procedure and the patient was kept under observation following institutional policy, for this type of procedure. Post-procedural neurological assessment was performed, showing return to baseline, prior to discharge. The patient was provided with post-procedure discharge instructions, including a section on how to identify potential problems. Should any problems arise concerning this procedure, the patient was given instructions to immediately contact us, at any time, without hesitation. In any case, we plan to contact the patient by telephone for a follow-up status report regarding this interventional procedure.  Comments:  No additional relevant information.  Plan of Care  Orders:  Orders Placed This Encounter  Procedures  . DG PAIN CLINIC C-ARM 1-60 MIN NO REPORT     Intraoperative interpretation by procedural physician at [place].    Standing Status:   Standing    Number of Occurrences:   1    Order Specific Question:   Reason for exam:    Answer:   Assistance in needle guidance  and placement for procedures requiring needle placement in or near specific anatomical locations not easily accessible without such assistance.   Medications ordered for procedure: Meds ordered this encounter  Medications  . diazepam (VALIUM) tablet 5 mg  . iohexol (OMNIPAQUE) 180 MG/ML injection 10 mL    Must be Myelogram-compatible. If not available, you may substitute with a water-soluble, non-ionic, hypoallergenic, myelogram-compatible radiological contrast medium.  Marland Kitchen lidocaine (XYLOCAINE) 2 % (with pres) injection 400 mg  . midazolam (VERSED) 5 MG/5ML injection 1-2 mg    Make sure Flumazenil is available in the pyxis when using this medication. If oversedation occurs, administer 0.2 mg IV over 15 sec. If after 45 sec no response, administer 0.2 mg again over 1 min; may repeat at 1 min intervals; not to exceed 4 doses (1 mg)  . fentaNYL (SUBLIMAZE) injection 25-50 mcg    Make sure Narcan is available in the pyxis when using this medication. In the event of respiratory depression (RR< 8/min): Titrate NARCAN (naloxone) in increments of 0.1 to 0.2 mg IV at 2-3 minute intervals, until desired degree of reversal.  . dexamethasone (DECADRON) injection 10 mg  . ropivacaine (PF) 2 mg/mL (0.2%) (NAROPIN) injection 9 mL   Medications administered: We administered diazepam, iohexol, lidocaine, midazolam, fentaNYL, dexamethasone, and ropivacaine (PF) 2 mg/mL (0.2%).  See the medical record for exact dosing, route, and time of administration.  Follow-up plan:   Return in about 4 weeks (around 06/20/2019) for Post Procedure Evaluation, virtual.      Status post bilateral SI joint injection on 05/23/2019   Recent Visits Date Type Provider Dept  05/16/19 Office Visit  Gillis Santa, MD [facility]  Showing recent visits within past 90 days and meeting all other requirements   Today's Visits Date Type Provider Dept  05/23/19 Procedure visit Gillis Santa, MD [facility]  Showing today's visits and meeting all other requirements   Future Appointments Date Type Provider Dept  06/21/19 Appointment Gillis Santa, MD [facility]  Showing future appointments within next 90 days and meeting all other requirements   Disposition: Discharge home  Discharge (Date  Time): 05/23/2019; 1125 hrs.   Primary Care Physician: Baxter Hire, MD Location: [facility] Note by: Gillis Santa, MD Date: 05/23/2019; Time: 2:28 PM  Disclaimer:  Medicine is not an exact science. The only guarantee in medicine is that nothing is guaranteed. It is important to note that the decision to proceed with this intervention was based on the information collected from the patient. The Data and conclusions were drawn from the patient's questionnaire, the interview, and the physical examination. Because the information was provided in large part by the patient, it cannot be guaranteed that it has not been purposely or unconsciously manipulated. Every effort has been made to obtain as much relevant data as possible for this evaluation. It is important to note that the conclusions that lead to this procedure are derived in large part from the available data. Always take into account that the treatment will also be dependent on availability of resources and existing treatment guidelines, considered by other Pain Management Practitioners as being common knowledge and practice, at the time of the intervention. For Medico-Legal purposes, it is also important to point out that variation in procedural techniques and pharmacological choices are the acceptable norm. The indications, contraindications, technique, and results of the above procedure  should only be interpreted and judged by a Board-Certified Interventional Pain Specialist with extensive familiarity and  expertise in the same exact procedure and technique.

## 2019-05-23 NOTE — Patient Instructions (Signed)

## 2019-05-25 ENCOUNTER — Encounter: Payer: Self-pay | Admitting: Student in an Organized Health Care Education/Training Program

## 2019-05-30 ENCOUNTER — Encounter: Payer: Self-pay | Admitting: Student in an Organized Health Care Education/Training Program

## 2019-05-31 ENCOUNTER — Encounter: Payer: Self-pay | Admitting: Student in an Organized Health Care Education/Training Program

## 2019-05-31 ENCOUNTER — Ambulatory Visit
Payer: Medicare Other | Attending: Student in an Organized Health Care Education/Training Program | Admitting: Student in an Organized Health Care Education/Training Program

## 2019-05-31 ENCOUNTER — Telehealth: Payer: Self-pay | Admitting: *Deleted

## 2019-05-31 ENCOUNTER — Other Ambulatory Visit: Payer: Self-pay

## 2019-05-31 DIAGNOSIS — M48062 Spinal stenosis, lumbar region with neurogenic claudication: Secondary | ICD-10-CM

## 2019-05-31 DIAGNOSIS — G8929 Other chronic pain: Secondary | ICD-10-CM

## 2019-05-31 DIAGNOSIS — M5416 Radiculopathy, lumbar region: Secondary | ICD-10-CM

## 2019-05-31 DIAGNOSIS — G894 Chronic pain syndrome: Secondary | ICD-10-CM | POA: Diagnosis not present

## 2019-05-31 DIAGNOSIS — Z981 Arthrodesis status: Secondary | ICD-10-CM | POA: Diagnosis not present

## 2019-05-31 NOTE — Telephone Encounter (Signed)
Attempted to call for pre appointment review of allergies/meds. Message left. 

## 2019-05-31 NOTE — Progress Notes (Signed)
Patient: Davinder Challis  Service Category: E/M  Provider: Bilal Lateef, MD  DOB: 01/18/1951  DOS: 05/31/2019  Location: Office  MRN: [id number]  Setting: Ambulatory outpatient  Referring Provider: Johnston, John D, MD  Type: Established Patient  Specialty: Interventional Pain Management  PCP: Johnston, John D, MD  Location: Home  Delivery: TeleHealth     Virtual Encounter - Pain Management PROVIDER NOTE: Information contained herein reflects review and annotations entered in association with encounter. Interpretation of such information and data should be left to medically-trained personnel. Information provided to patient can be located elsewhere in the medical record under "Patient Instructions". Document created using STT-dictation technology, any transcriptional errors that may result from process are unintentional.    Contact & Pharmacy Preferred: [phone] Home: [phone] (home) Mobile: [phone] (mobile) E-mail: [email or web address]  [facility] - [place], [place] - [address] [place] [address] Phone: [phone] Fax: [phone]   Pre-screening  Mr. Waltermire offered "in-person" vs "virtual" encounter. He indicated preferring virtual for this encounter.   Reason COVID-19*  Social distancing based on CDC and AMA recommendations.   I contacted Rion Carder on 05/31/2019 via telephone.      I clearly identified myself as Bilal Lateef, MD. I verified that I was speaking with the correct person using two identifiers (Name: Ryland Shankar, and date of birth: 09/12/1950).  This visit was completed via telephone due to the restrictions of the COVID-19 pandemic. All issues as above were discussed and addressed but no physical exam was performed. If it was felt that the patient should be evaluated in the office, they were directed there. The patient verbally consented to this visit. Patient was unable to complete an audio/visual visit due to Technical difficulties  and/or Lack of internet. Due to the catastrophic nature of the COVID-19 pandemic, this visit was done through audio contact only.  Location of the patient: home address (see Epic for details)  Location of the provider: office Consent I sought verbal advanced consent from Katlin Ingalls for virtual visit interactions. I informed Mr. Larmon of possible security and privacy concerns, risks, and limitations associated with providing "not-in-person" medical evaluation and management services. I also informed Mr. Patrie of the availability of "in-person" appointments. Finally, I informed him that there would be a charge for the virtual visit and that he could be  personally, fully or partially, financially responsible for it. Mr. Serpa expressed understanding and agreed to proceed.   Historic Elements   Mr. Wilton Parkhurst is a 69 y.o. year old, male patient evaluated today after his last contact with our practice on 05/31/2019. Mr. Ketcherside  has a past medical history of Anginal pain (HCC), Arthritis, Back pain, Claustrophobia, Depression, Diabetes mellitus without complication (HCC), Dyspnea, Elevated lipids, Elevated lipids, Environmental and seasonal allergies, Fatty liver, GERD (gastroesophageal reflux disease), Glaucoma, Hepatitis, HH (hiatus hernia), Hip pain (Right), History of kidney stones, Knee pain, Panic attacks, and Sleep apnea. He also  has a past surgical history that includes Back surgery; Replacement total knee (Left); Bone marrow transplant; Total hip arthroplasty (Right, 01/26/2018); Colonoscopy with propofol (N/A, 04/13/2018); Esophagogastroduodenoscopy (egd) with propofol (N/A, 04/13/2018); Joint replacement; and Total hip revision (Right, 11/02/2018). Mr. Dame has a current medication list which includes the following prescription(s): bayer aspirin ec low dose, calcium-magnesium-zinc, cholecalciferol, cyanocobalamin, glimepiride, magnesium, metformin, pravastatin, trazodone, and vitamin c. He  reports  that he has never smoked. He has never used smokeless tobacco. He reports previous alcohol use. He reports that he does not   use drugs. Mr. Quintero is allergic to levaquin [levofloxacin].   HPI  Today, he is being contacted for a post-procedure assessment.    S/P bilateral SI-J on 3/15, very short term pain relief, last 2 days maximum with return to baseline. CT Lumbar spine showed L3-L4 moderate multifactorial stenosis as well as L4-L5 severe spinal stenosis with associated L5-S1 lumbar fusion.  Patient continues to have low back and radiating buttock and proximal thigh pain.  His last epidural steroid injection was in the 1980s.  We discussed an interlaminar epidural steroid injection at L3-L4.  Risks and benefits were reviewed and patient would like to proceed.  Laboratory Chemistry Profile   Renal Lab Results  Component Value Date   BUN 9 11/03/2018   CREATININE 0.80 11/03/2018   GFRAA >60 11/03/2018   GFRNONAA >60 11/03/2018    Hepatic No results found for: AST, ALT, ALBUMIN, ALKPHOS, HCVAB, AMYLASE, LIPASE, AMMONIA  Electrolytes Lab Results  Component Value Date   NA 137 11/03/2018   K 5.1 11/03/2018   CL 104 11/03/2018   CALCIUM 8.6 (L) 11/03/2018    Bone No results found for: VD25OH, VD125OH2TOT, VD3125OH2, VD2125OH2, 25OHVITD1, 25OHVITD2, 25OHVITD3, TESTOFREE, TESTOSTERONE  Inflammation (CRP: Acute Phase) (ESR: Chronic Phase) Lab Results  Component Value Date   ESRSEDRATE 22 (H) 10/26/2018      Note: Above Lab results reviewed.  Imaging  DG PAIN CLINIC C-ARM 1-60 MIN NO REPORT Fluoro was used, but no Radiologist interpretation will be provided.  Please refer to "NOTES" tab for provider progress note.  Assessment  The primary encounter diagnosis was Chronic radicular lumbar pain. Diagnoses of Lumbar radiculopathy, Spinal stenosis, lumbar region, with neurogenic claudication, History of lumbar fusion  (Hx of L5/S1 in 2000), and Chronic pain syndrome were also pertinent  to this visit.  Plan of Care   Mr. Nethan Russon has a current medication list which includes the following long-term medication(s): glimepiride, metformin, pravastatin, and trazodone.   Orders:  Orders Placed This Encounter  Procedures  . Lumbar Epidural Injection    Standing Status:   Future    Standing Expiration Date:   07/01/2019    Scheduling Instructions:     Procedure: Interlaminar Lumbar Epidural Steroid injection (LESI)            Laterality: Midline     Sedation: with     Timeframe: ASAA    Order Specific Question:   Where will this procedure be performed?    Answer:   [facility]   Follow-up plan:   Return in about 1 week (around 06/07/2019) for L3/4 ESI , with sedation.     Status post bilateral SI joint injection on 05/23/2019- not helpful L3/4 and L4/5 spinal stenosis (L5/S1 fusion)- plan for L-ESI    Recent Visits Date Type Provider Dept  05/23/19 Procedure visit Lateef, Bilal, MD [facility]  05/16/19 Office Visit Lateef, Bilal, MD [facility]  Showing recent visits within past 90 days and meeting all other requirements   Today's Visits Date Type Provider Dept  05/31/19 Office Visit Lateef, Bilal, MD [facility]  Showing today's visits and meeting all other requirements   Future Appointments No visits were found meeting these conditions.  Showing future appointments within next 90 days and meeting all other requirements   I discussed the assessment and treatment plan with the patient. The patient was provided an opportunity to ask questions and all were answered. The patient agreed with the plan and demonstrated an   understanding of the instructions.  Patient advised to call back or seek an in-person evaluation if the symptoms or condition worsens.  Duration of encounter: 25 minutes.  Note by: Gillis Santa, MD Date: 05/31/2019; Time: 11:39 AM

## 2019-06-02 NOTE — Telephone Encounter (Signed)
Dena, I have tried to call this patient x2 and lvmails. Im going to try to send msg back to him to call for appt.  Thanks [employer]

## 2019-06-13 ENCOUNTER — Ambulatory Visit
Admission: RE | Admit: 2019-06-13 | Discharge: 2019-06-13 | Disposition: A | Discharge status: Home / Self Care | Payer: Medicare Other | Source: Ambulatory Visit | Attending: Student in an Organized Health Care Education/Training Program | Admitting: Student in an Organized Health Care Education/Training Program

## 2019-06-13 ENCOUNTER — Other Ambulatory Visit: Payer: Self-pay

## 2019-06-13 ENCOUNTER — Ambulatory Visit (HOSPITAL_BASED_OUTPATIENT_CLINIC_OR_DEPARTMENT_OTHER): Payer: Medicare Other | Admitting: Student in an Organized Health Care Education/Training Program

## 2019-06-13 ENCOUNTER — Encounter: Payer: Self-pay | Admitting: Student in an Organized Health Care Education/Training Program

## 2019-06-13 VITALS — BP 127/62 | HR 65 | Temp 98.1°F | Resp 20 | Ht 69.0 in | Wt 292.0 lb

## 2019-06-13 DIAGNOSIS — M5416 Radiculopathy, lumbar region: Secondary | ICD-10-CM | POA: Diagnosis present

## 2019-06-13 DIAGNOSIS — G8929 Other chronic pain: Secondary | ICD-10-CM

## 2019-06-13 MED ORDER — SODIUM CHLORIDE (PF) 0.9 % IJ SOLN
INTRAMUSCULAR | Status: AC
  Filled 2019-06-13: qty 10

## 2019-06-13 MED ORDER — DIAZEPAM 5 MG PO TABS
ORAL_TABLET | ORAL | Status: AC
  Filled 2019-06-13: qty 1

## 2019-06-13 MED ORDER — MIDAZOLAM HCL 5 MG/5ML IJ SOLN
INTRAMUSCULAR | Status: AC
  Filled 2019-06-13: qty 5

## 2019-06-13 MED ORDER — MIDAZOLAM HCL 5 MG/5ML IJ SOLN
1.0000 mg | INTRAMUSCULAR | Status: DC | PRN
  Administered 2019-06-13: 1 mg via INTRAVENOUS

## 2019-06-13 MED ORDER — IOHEXOL 180 MG/ML  SOLN
INTRAMUSCULAR | Status: AC
  Filled 2019-06-13: qty 20

## 2019-06-13 MED ORDER — IOHEXOL 180 MG/ML  SOLN
10.0000 mL | Freq: Once | INTRAMUSCULAR | Status: AC
  Administered 2019-06-13: 10 mL via EPIDURAL

## 2019-06-13 MED ORDER — DEXAMETHASONE SODIUM PHOSPHATE 10 MG/ML IJ SOLN
INTRAMUSCULAR | Status: AC
  Filled 2019-06-13: qty 1

## 2019-06-13 MED ORDER — SODIUM CHLORIDE 0.9% FLUSH
2.0000 mL | Freq: Once | INTRAVENOUS | Status: AC
  Administered 2019-06-13: 10 mL

## 2019-06-13 MED ORDER — FENTANYL CITRATE (PF) 100 MCG/2ML IJ SOLN
INTRAMUSCULAR | Status: AC
  Filled 2019-06-13: qty 2

## 2019-06-13 MED ORDER — FENTANYL CITRATE (PF) 100 MCG/2ML IJ SOLN
25.0000 ug | INTRAMUSCULAR | Status: DC | PRN
  Administered 2019-06-13: 50 ug via INTRAVENOUS

## 2019-06-13 MED ORDER — DIAZEPAM 5 MG PO TABS
5.0000 mg | ORAL_TABLET | Freq: Once | ORAL | Status: AC
  Administered 2019-06-13: 5 mg via ORAL
  Filled 2019-06-13: qty 1

## 2019-06-13 MED ORDER — LIDOCAINE HCL 2 % IJ SOLN
INTRAMUSCULAR | Status: AC
  Filled 2019-06-13: qty 20

## 2019-06-13 MED ORDER — ROPIVACAINE HCL 2 MG/ML IJ SOLN
INTRAMUSCULAR | Status: AC
  Filled 2019-06-13: qty 10

## 2019-06-13 MED ORDER — DEXAMETHASONE SODIUM PHOSPHATE 10 MG/ML IJ SOLN
10.0000 mg | Freq: Once | INTRAMUSCULAR | Status: AC
  Administered 2019-06-13: 10 mg

## 2019-06-13 MED ORDER — LIDOCAINE HCL 2 % IJ SOLN
20.0000 mL | Freq: Once | INTRAMUSCULAR | Status: AC
  Administered 2019-06-13: 400 mg

## 2019-06-13 MED ORDER — ROPIVACAINE HCL 2 MG/ML IJ SOLN
2.0000 mL | Freq: Once | INTRAMUSCULAR | Status: AC
  Administered 2019-06-13: 10 mL via EPIDURAL

## 2019-06-13 NOTE — Patient Instructions (Signed)

## 2019-06-13 NOTE — Progress Notes (Signed)
PROVIDER NOTE: Information contained herein reflects review and annotations entered in association with encounter. Interpretation of such information and data should be left to medically-trained personnel. Information provided to patient can be located elsewhere in the medical record under "Patient Instructions". Document created using STT-dictation technology, any transcriptional errors that may result from process are unintentional.    Patient: Mario [name]  Service Category: Procedure  Provider: Gillis Santa, MD  DOB: Sep 26, 1950  DOS: 06/13/2019  Location: [facility]  MRN: [id number]  Setting: Ambulatory - outpatient  Referring Provider: Baxter Hire, MD  Type: Established Patient  Specialty: Interventional Pain Management  PCP: Baxter Hire, MD   Primary Reason for Visit: Interventional Pain Management Treatment. CC: Back Pain (lumbar left )  Procedure:          Anesthesia, Analgesia, Anxiolysis:  Type: Diagnostic Inter-Laminar Epidural Steroid Injection  #1  Region: Lumbar Level: L3-4 Level. Laterality: Midline         Type: Moderate (Conscious) Sedation combined with Local Anesthesia Indication(s): Analgesia and Anxiety Route: Intravenous (IV) IV Access: Secured Sedation: Meaningful verbal contact was maintained at all times during the procedure  Local Anesthetic: Lidocaine 1-2%  Position: Prone with head of the table was raised to facilitate breathing.   Indications: 1. Chronic radicular lumbar pain   2. Lumbar radiculopathy    Pain Score: Pre-procedure: 6 /10 Post-procedure: 0-No pain/10   Pre-op Assessment:  Mr. Kurtzman is a 69 y.o. (year old), male patient, seen today for interventional treatment. He  has a past surgical history that includes Back surgery; Replacement total knee (Left); Bone marrow transplant; Total hip arthroplasty (Right, 01/26/2018); Colonoscopy with propofol (N/A, 04/13/2018); Esophagogastroduodenoscopy (egd) with propofol (N/A,  04/13/2018); Joint replacement; and Total hip revision (Right, 11/02/2018). Mr. Peot has a current medication list which includes the following prescription(s): bayer aspirin ec low dose, calcium-magnesium-zinc, cholecalciferol, cyanocobalamin, glimepiride, magnesium, metformin, pravastatin, trazodone, and vitamin c, and the following Facility-Administered Medications: fentanyl and midazolam. His primarily concern today is the Back Pain (lumbar left )  Initial Vital Signs:  Pulse/HCG Rate: 65ECG Heart Rate: 81 Temp: 97.7 F (36.5 C) Resp: 16 BP: 113/66 SpO2: 100 %  BMI: Estimated body mass index is 43.12 kg/m as calculated from the following:   Height as of this encounter: 5\' 9"  (1.753 m).   Weight as of this encounter: 292 lb (132.5 kg).  Risk Assessment: Allergies: Reviewed. He is allergic to levaquin [levofloxacin].  Allergy Precautions: None required Coagulopathies: Reviewed. None identified.  Blood-thinner therapy: None at this time Active Infection(s): Reviewed. None identified. Mr. Chock is afebrile  Site Confirmation: Mr. Panagopoulos was asked to confirm the procedure and laterality before marking the site Procedure checklist: Completed Consent: Before the procedure and under the influence of no sedative(s), amnesic(s), or anxiolytics, the patient was informed of the treatment options, risks and possible complications. To fulfill our ethical and legal obligations, as recommended by the American Medical Association's Code of Ethics, I have informed the patient of my clinical impression; the nature and purpose of the treatment or procedure; the risks, benefits, and possible complications of the intervention; the alternatives, including doing nothing; the risk(s) and benefit(s) of the alternative treatment(s) or procedure(s); and the risk(s) and benefit(s) of doing nothing. The patient was provided information about the general risks and possible complications associated with the procedure.  These may include, but are not limited to: failure to achieve desired goals, infection, bleeding, organ or nerve damage, allergic reactions, paralysis, and death. In  addition, the patient was informed of those risks and complications associated to Spine-related procedures, such as failure to decrease pain; infection (i.e.: Meningitis, epidural or intraspinal abscess); bleeding (i.e.: epidural hematoma, subarachnoid hemorrhage, or any other type of intraspinal or peri-dural bleeding); organ or nerve damage (i.e.: Any type of peripheral nerve, nerve root, or spinal cord injury) with subsequent damage to sensory, motor, and/or autonomic systems, resulting in permanent pain, numbness, and/or weakness of one or several areas of the body; allergic reactions; (i.e.: anaphylactic reaction); and/or death. Furthermore, the patient was informed of those risks and complications associated with the medications. These include, but are not limited to: allergic reactions (i.e.: anaphylactic or anaphylactoid reaction(s)); adrenal axis suppression; blood sugar elevation that in diabetics may result in ketoacidosis or comma; water retention that in patients with history of congestive heart failure may result in shortness of breath, pulmonary edema, and decompensation with resultant heart failure; weight gain; swelling or edema; medication-induced neural toxicity; particulate matter embolism and blood vessel occlusion with resultant organ, and/or nervous system infarction; and/or aseptic necrosis of one or more joints. Finally, the patient was informed that Medicine is not an exact science; therefore, there is also the possibility of unforeseen or unpredictable risks and/or possible complications that may result in a catastrophic outcome. The patient indicated having understood very clearly. We have given the patient no guarantees and we have made no promises. Enough time was given to the patient to ask questions, all of which were  answered to the patient's satisfaction. Mr. Tillar has indicated that he wanted to continue with the procedure. Attestation: I, the ordering provider, attest that I have discussed with the patient the benefits, risks, side-effects, alternatives, likelihood of achieving goals, and potential problems during recovery for the procedure that I have provided informed consent. Date  Time: 06/13/2019  9:00 AM  Pre-Procedure Preparation:  Monitoring: As per clinic protocol. Respiration, ETCO2, SpO2, BP, heart rate and rhythm monitor placed and checked for adequate function Safety Precautions: Patient was assessed for positional comfort and pressure points before starting the procedure. Time-out: I initiated and conducted the "Time-out" before starting the procedure, as per protocol. The patient was asked to participate by confirming the accuracy of the "Time Out" information. Verification of the correct person, site, and procedure were performed and confirmed by me, the nursing staff, and the patient. "Time-out" conducted as per Joint Commission's Universal Protocol (UP.01.01.01). Time: [id number]  Description of Procedure:          Target Area: The interlaminar space, initially targeting the lower laminar border of the superior vertebral body. Approach: Paramedial approach. Area Prepped: Entire Posterior Lumbar Region DuraPrep (Iodine Povacrylex [0.7% available iodine] and Isopropyl Alcohol, 74% w/w) Safety Precautions: Aspiration looking for blood return was conducted prior to all injections. At no point did we inject any substances, as a needle was being advanced. No attempts were made at seeking any paresthesias. Safe injection practices and needle disposal techniques used. Medications properly checked for expiration [name]. SDV (single dose vial) medications used. Description of the Procedure: Protocol guidelines were followed. The procedure needle was introduced through the skin, ipsilateral to the reported  pain, and advanced to the target area. Bone was contacted and the needle walked caudad, until the lamina was cleared. The epidural space was identified using "loss-of-resistance technique" with 2-3 ml of PF-NaCl (0.9% NSS), in a 5cc LOR glass syringe.  Vitals:   06/13/19 0951 06/13/19 1001 06/13/19 1011 06/13/19 1021  BP: 131/84 128/62 130/67 127/62  Pulse:  Resp: 16 17 20 20   Temp:  98.2 F (36.8 C)  98.1 F (36.7 C)  TempSrc:      SpO2: 94% 99% 99% 99%  Weight:      Height:        Start Time: 0943 hrs. End Time: 0950 hrs.  Materials:  Needle(s) Type: Epidural needle Gauge: 17G Length: 5-in Medication(s): Please see orders for medications and dosing details. 6 cc solution made of 3 cc of preservative-free saline, 2 cc of 0.2% ropivacaine, 1 cc of Decadron 10 mg/cc.  Imaging Guidance (Spinal):          Type of Imaging Technique: Fluoroscopy Guidance (Spinal) Indication(s): Assistance in needle guidance and placement for procedures requiring needle placement in or near specific anatomical locations not easily accessible without such assistance. Exposure Time: Please see nurses notes. Contrast: Before injecting any contrast, we confirmed that the patient did not have an allergy to iodine, shellfish, or radiological contrast. Once satisfactory needle placement was completed at the desired level, radiological contrast was injected. Contrast injected under live fluoroscopy. No contrast complications. See chart for type and volume of contrast used. Fluoroscopic Guidance: I was personally present during the use of fluoroscopy. "Tunnel Vision Technique" used to obtain the best possible view of the target area. Parallax error corrected before commencing the procedure. "Direction-depth-direction" technique used to introduce the needle under continuous pulsed fluoroscopy. Once target was reached, antero-posterior, oblique, and lateral fluoroscopic projection used confirm needle placement in  all planes. Images permanently stored in EMR. Interpretation: I personally interpreted the imaging intraoperatively. Adequate needle placement confirmed in multiple planes. Appropriate spread of contrast into desired area was observed. No evidence of afferent or efferent intravascular uptake. No intrathecal or subarachnoid spread observed. Permanent images saved into the patient's record.  Antibiotic Prophylaxis:   Anti-infectives (From admission, onward)   None     Indication(s): None identified  Post-operative Assessment:  Post-procedure Vital Signs:  Pulse/HCG Rate: 6570 Temp: 98.1 F (36.7 C) Resp: 20 BP: 127/62 SpO2: 99 %  EBL: None  Complications: No immediate post-treatment complications observed by team, or reported by patient.  Note: The patient tolerated the entire procedure well. A repeat set of vitals were taken after the procedure and the patient was kept under observation following institutional policy, for this type of procedure. Post-procedural neurological assessment was performed, showing return to baseline, prior to discharge. The patient was provided with post-procedure discharge instructions, including a section on how to identify potential problems. Should any problems arise concerning this procedure, the patient was given instructions to immediately contact us, at any time, without hesitation. In any case, we plan to contact the patient by telephone for a follow-up status report regarding this interventional procedure.  Comments:  No additional relevant information.  5 out of 5 strength bilateral lower extremity: Plantar flexion, dorsiflexion, knee flexion, knee extension.  Plan of Care  Orders:  Orders Placed This Encounter  Procedures  . DG PAIN [facility] C-ARM 1-60 MIN NO REPORT    Intraoperative interpretation by procedural physician at [facility].    Standing Status:   Standing    Number of Occurrences:   1    Order Specific Question:    Reason for exam:    Answer:   Assistance in needle guidance and placement for procedures requiring needle placement in or near specific anatomical locations not easily accessible without such assistance.   Medications ordered for procedure: Meds ordered this encounter  Medications  . iohexol (OMNIPAQUE) 180  MG/ML injection 10 mL    Must be Myelogram-compatible. If not available, you may substitute with a water-soluble, non-ionic, hypoallergenic, myelogram-compatible radiological contrast medium.  Marland Kitchen lidocaine (XYLOCAINE) 2 % (with pres) injection 400 mg  . midazolam (VERSED) 5 MG/5ML injection 1-2 mg    Make sure Flumazenil is available in the pyxis when using this medication. If oversedation occurs, administer 0.2 mg IV over 15 sec. If after 45 sec no response, administer 0.2 mg again over 1 min; may repeat at 1 min intervals; not to exceed 4 doses (1 mg)  . fentaNYL (SUBLIMAZE) injection 25-50 mcg    Make sure Narcan is available in the pyxis when using this medication. In the event of respiratory depression (RR< 8/min): Titrate NARCAN (naloxone) in increments of 0.1 to 0.2 mg IV at 2-3 minute intervals, until desired degree of reversal.  . ropivacaine (PF) 2 mg/mL (0.2%) (NAROPIN) injection 2 mL  . sodium chloride flush (NS) 0.9 % injection 2 mL  . dexamethasone (DECADRON) injection 10 mg  . diazepam (VALIUM) tablet 5 mg   Medications administered: We administered iohexol, lidocaine, midazolam, fentaNYL, ropivacaine (PF) 2 mg/mL (0.2%), sodium chloride flush, dexamethasone, and diazepam.  See the medical record for exact dosing, route, and time of administration.  Follow-up plan:   Return in about 4 weeks (around 07/11/2019) for Post Procedure Evaluation, virtual.      Status post bilateral SI joint injection on 05/23/2019- not helpful L3/4 and L4/5 spinal stenosis (L5/S1 fusion)-L3-L4 ESI #1 on 06/13/2019    Recent Visits Date Type Provider Dept  05/31/19 Office Visit Gillis Santa, MD  [facility]  05/23/19 Procedure visit Gillis Santa, MD [facility]  05/16/19 Office Visit Gillis Santa, MD [facility]  Showing recent visits within past 90 days and meeting all other requirements   Today's Visits Date Type Provider Dept  06/13/19 Procedure visit Gillis Santa, MD [facility]  Showing today's visits and meeting all other requirements   Future Appointments Date Type Provider Dept  07/11/19 Appointment Gillis Santa, MD [facility]  Showing future appointments within next 90 days and meeting all other requirements   Disposition: Discharge home  Discharge (Date  Time): 06/13/2019; 1030 hrs.   Primary Care Physician: Baxter Hire, MD Location: [facility] Note by: Gillis Santa, MD Date: 06/13/2019; Time: 11:41 AM  Disclaimer:  Medicine is not an exact science. The only guarantee in medicine is that nothing is guaranteed. It is important to note that the decision to proceed with this intervention was based on the information collected from the patient. The Data and conclusions were drawn from the patient's questionnaire, the interview, and the physical examination. Because the information was provided in large part by the patient, it cannot be guaranteed that it has not been purposely or unconsciously manipulated. Every effort has been made to obtain as much relevant data as possible for this evaluation. It is important to note that the conclusions that lead to this procedure are derived in large part from the available data. Always take into account that the treatment will also be dependent on availability of resources and existing treatment guidelines, considered by other Pain Management Practitioners as being common knowledge and practice, at the time of the intervention. For Medico-Legal purposes, it is also important to point out that variation in procedural techniques and pharmacological  choices are the acceptable norm. The indications, contraindications, technique, and results of the above procedure should only be interpreted  and judged by a Board-Certified Interventional Pain Specialist with extensive familiarity and expertise in the same exact procedure and technique.

## 2019-06-14 ENCOUNTER — Telehealth: Payer: Self-pay

## 2019-06-14 NOTE — Telephone Encounter (Signed)
POst procedure phone call.  LM  

## 2019-06-21 ENCOUNTER — Ambulatory Visit: Payer: PRIVATE HEALTH INSURANCE | Admitting: Student in an Organized Health Care Education/Training Program

## 2019-06-24 ENCOUNTER — Encounter: Payer: Self-pay | Admitting: Student in an Organized Health Care Education/Training Program

## 2019-07-07 ENCOUNTER — Telehealth: Payer: Self-pay | Admitting: *Deleted

## 2019-07-07 NOTE — Telephone Encounter (Signed)
Attempted to call for pre appointment review of allergies/meds. Message left. 

## 2019-07-11 ENCOUNTER — Other Ambulatory Visit: Payer: Self-pay

## 2019-07-11 ENCOUNTER — Encounter: Payer: Self-pay | Admitting: Student in an Organized Health Care Education/Training Program

## 2019-07-11 ENCOUNTER — Ambulatory Visit
Payer: Medicare Other | Attending: Student in an Organized Health Care Education/Training Program | Admitting: Student in an Organized Health Care Education/Training Program

## 2019-07-11 NOTE — Progress Notes (Signed)
I attempted to call the patient however no response. Voicemail left instructing patient to call front desk office at [phone] to reschedule appointment. -Dr Kobe Jansma  

## 2019-07-27 ENCOUNTER — Telehealth: Payer: Self-pay

## 2019-07-27 ENCOUNTER — Encounter: Payer: Self-pay | Admitting: Student in an Organized Health Care Education/Training Program

## 2019-07-27 NOTE — Telephone Encounter (Signed)
LM for patient to call office

## 2019-07-28 ENCOUNTER — Ambulatory Visit
Payer: Medicare Other | Attending: Student in an Organized Health Care Education/Training Program | Admitting: Student in an Organized Health Care Education/Training Program

## 2019-07-28 ENCOUNTER — Encounter: Payer: Self-pay | Admitting: Student in an Organized Health Care Education/Training Program

## 2019-07-28 ENCOUNTER — Telehealth: Payer: Self-pay

## 2019-07-28 ENCOUNTER — Other Ambulatory Visit: Payer: Self-pay

## 2019-07-28 DIAGNOSIS — Z981 Arthrodesis status: Secondary | ICD-10-CM

## 2019-07-28 DIAGNOSIS — M5416 Radiculopathy, lumbar region: Secondary | ICD-10-CM | POA: Diagnosis not present

## 2019-07-28 DIAGNOSIS — G8929 Other chronic pain: Secondary | ICD-10-CM | POA: Diagnosis not present

## 2019-07-28 DIAGNOSIS — M48062 Spinal stenosis, lumbar region with neurogenic claudication: Secondary | ICD-10-CM | POA: Diagnosis not present

## 2019-07-28 NOTE — Progress Notes (Deleted)
I attempted to call the patient however no response. Voicemail left instructing patient to call front desk office at [phone] to reschedule appointment. -Dr Yamileth Hayse  

## 2019-07-28 NOTE — Progress Notes (Signed)
Patient: Mario [name]  Service Category: E/M  Provider: Gillis Santa, MD  DOB: [birth year]  DOS: 07/28/2019  Location: Office  MRN: [id number]  Setting: Ambulatory outpatient  Referring Provider: Baxter Hire, MD  Type: Established Patient  Specialty: Interventional Pain Management  PCP: Baxter Hire, MD  Location: Home  Delivery: TeleHealth     Virtual Encounter - Pain Management PROVIDER NOTE: Information contained herein reflects review and annotations entered in association with encounter. Interpretation of such information and data should be left to medically-trained personnel. Information provided to patient can be located elsewhere in the medical record under "Patient Instructions". Document created using STT-dictation technology, any transcriptional errors that may result from process are unintentional.    Contact & Pharmacy Preferred: [phone] Home: [phone] (home) Mobile: [phone] (mobile) E-mail: [email or web address] .[facility] [place], [place] - [address] [place] [address] Phone: [phone] Fax: [phone]   Pre-screening  Mario [name] offered "in-person" vs "virtual" encounter. He indicated preferring virtual for this encounter.   Reason COVID-19*  Social distancing based on CDC and AMA recommendations.   I contacted Mario [name] on 07/28/2019 via televisit.      I clearly identified myself as Gillis Santa, MD. I verified that I was speaking with the correct person using two identifiers (Name: Mario [name], and date of birth: 02/08/1951).  Consent I sought verbal advanced consent from Mario [name] for virtual visit interactions. I informed Mario [name] of possible security and privacy concerns, risks, and limitations associated with providing "not-in-person" medical evaluation and management services. I also informed Mario [name] of the availability of "in-person" appointments. Finally, I informed him that there would be a charge  for the virtual visit and that he could be  personally, fully or partially, financially responsible for it. Mario [name] expressed understanding and agreed to proceed.   Historic Elements   Mario [name] is a 69 y.o. year old, male patient evaluated today after his last contact with our practice on 07/28/2019. Mario [name]  has a past medical history of Anginal pain ([place]), Arthritis, Back pain, Claustrophobia, Depression, Diabetes mellitus without complication ([place]), Dyspnea, Elevated lipids, Elevated lipids, Environmental and seasonal allergies, Fatty liver, GERD (gastroesophageal reflux disease), Glaucoma, Hepatitis, HH (hiatus hernia), Hip pain (Right), History of kidney stones, Knee pain, Panic attacks, and Sleep apnea. He also  has a past surgical history that includes Back surgery; Replacement total knee (Left); Bone marrow transplant; Total hip arthroplasty (Right, 01/26/2018); Colonoscopy with propofol (N/A, 04/13/2018); Esophagogastroduodenoscopy (egd) with propofol (N/A, 04/13/2018); Joint replacement; and Total hip revision (Right, 11/02/2018). Mario [name] has a current medication list which includes the following prescription(s): bayer aspirin ec low dose, calcium-magnesium-zinc, cholecalciferol, cyanocobalamin, glimepiride, magnesium, metformin, pravastatin, trazodone, and vitamin c. He  reports that he has never smoked. He has never used smokeless tobacco. He reports previous alcohol use. He reports that he does not use drugs. Mario [name] is allergic to levaquin [levofloxacin].   HPI  Today, he is being contacted for a post-procedure assessment.  Evaluation of last interventional procedure  06/13/2019 Procedure: Type: Diagnostic Inter-Laminar Epidural Steroid Injection  #1  Region: Lumbar Level: L3-4 Level. Laterality: Midline    Sedation: Please see nurses note for DOS. When no sedatives are used, the analgesic levels obtained are directly associated to the effectiveness of the local anesthetics.  However, when sedation is provided, the level of analgesia obtained during the initial 1 hour following the intervention, is believed to be the result of a  combination of factors. These factors may include, but are not limited to: 1. The effectiveness of the local anesthetics used. 2. The effects of the analgesic(s) and/or anxiolytic(s) used. 3. The degree of discomfort experienced by the patient at the time of the procedure. 4. The patients ability and reliability in recalling and recording the events. 5. The presence and influence of possible secondary gains and/or psychosocial factors. Reported result: Relief experienced during the 1st hour after the procedure: 100%   (Ultra-Short Term Relief)            Interpretative annotation: Clinically appropriate result. Analgesia during this period is likely to be Local Anesthetic and/or IV Sedative (Analgesic/Anxiolytic) related.          Effects of local anesthetic: The analgesic effects attained during this period are directly associated to the localized infiltration of local anesthetics and therefore cary significant diagnostic value as to the etiological location, or anatomical origin, of the pain. Expected duration of relief is directly dependent on the pharmacodynamics of the local anesthetic used. Long-acting (4-6 hours) anesthetics used.  Reported result: Relief during the next 4 to 6 hour after the procedure:100%   (Short-Term Relief)            Interpretative annotation: Clinically appropriate result. Analgesia during this period is likely to be Local Anesthetic-related.          Long-term benefit: Defined as the period of time past the expected duration of local anesthetics (1 hour for short-acting and 4-6 hours for long-acting). With the possible exception of prolonged sympathetic blockade from the local anesthetics, benefits during this period are typically attributed to, or associated with, other factors such as analgesic sensory neuropraxia,  antiinflammatory effects, or beneficial biochemical changes provided by agents other than the local anesthetics.  Reported result: Extended relief following procedure:60-75% with pain that is gradually returning   (Long-Term Relief)            Interpretative annotation: Clinically appropriate result. Good relief. No permanent benefit expected. Inflammation plays a part in the etiology to the pain.          Repeat ESI #2  Laboratory Chemistry Profile   Renal Lab Results  Component Value Date   BUN 9 11/03/2018   CREATININE 0.80 11/03/2018   GFRAA >60 11/03/2018   GFRNONAA >60 11/03/2018     Hepatic No results found for: AST, ALT, ALBUMIN, ALKPHOS, HCVAB, AMYLASE, LIPASE, AMMONIA   Electrolytes Lab Results  Component Value Date   NA 137 11/03/2018   K 5.1 11/03/2018   CL 104 11/03/2018   CALCIUM 8.6 (L) 11/03/2018     Bone No results found for: VD25OH, [id number], RA0762UQ3, FH5456YB6, 25OHVITD1, 25OHVITD2, 25OHVITD3, TESTOFREE, TESTOSTERONE   Inflammation (CRP: Acute Phase) (ESR: Chronic Phase) Lab Results  Component Value Date   ESRSEDRATE 22 (H) 10/26/2018       Note: Above Lab results reviewed.  Imaging  DG PAIN CLINIC C-ARM 1-60 MIN NO REPORT Fluoro was used, but no Radiologist interpretation will be provided.  Please refer to "NOTES" tab for provider progress note.  Assessment  The primary encounter diagnosis was Chronic radicular lumbar pain. Diagnoses of Lumbar radiculopathy, Spinal stenosis, lumbar region, with neurogenic claudication, and History of lumbar fusion  (Hx of L5/S1 in 2000) were also pertinent to this visit.  Plan of Care  Mario [name] has a current medication list which includes the following long-term medication(s): glimepiride, metformin, pravastatin, and trazodone.  1. Chronic radicular lumbar pain - Lumbar Epidural  Injection #2 (L3/4 midline); Future  2. Lumbar radiculopathy - Lumbar Epidural Injection; Future  3. Spinal stenosis,  lumbar region, with neurogenic claudication -L-ESI as above  4. History of lumbar fusion  (Hx of L5/S1 in 2000) -Consider SCS trial vs [employer] Medial Branch PNS  Orders:  Orders Placed This Encounter  Procedures  . Lumbar Epidural Injection    Standing Status:   Future    Standing Expiration Date:   08/28/2019    Scheduling Instructions:     Procedure: Interlaminar Lumbar Epidural Steroid injection (LESI)            Laterality: Midline     Sedation: Patient's choice.     Timeframe: ASAA    Order Specific Question:   Where will this procedure be performed?    Answer:   [facility]   Follow-up plan:   Return in about 1 week (around 08/04/2019) for L3-4 ESI #2, with sedation.     Status post bilateral SI joint injection on 05/23/2019- not helpful L3/4 and L4/5 spinal stenosis (L5/S1 fusion)-L3-L4 ESI #1 on 06/13/2019     Recent Visits Date Type Provider Dept  06/13/19 Procedure visit Gillis Santa, [facility]  05/31/19 Office Visit Gillis Santa, MD [facility]  05/23/19 Procedure visit Gillis Santa, MD [facility]  05/16/19 Office Visit Gillis Santa, MD [facility]  Showing recent visits within past 90 days and meeting all other requirements   Today's Visits Date Type Provider Dept  07/28/19 Office Visit Gillis Santa, MD [facility]  Showing today's visits and meeting all other requirements   Future Appointments No visits were found meeting these conditions.  Showing future appointments within next 90 days and meeting all other requirements   I discussed the assessment and treatment plan with the patient. The patient was provided an opportunity to ask questions and all were answered. The patient agreed with the plan and demonstrated an understanding of the instructions.  Patient advised to call back or seek an in-person evaluation if the symptoms or condition worsens.  Duration of encounter: 25 minutes.  Note by:  Gillis Santa, MD Date: 07/28/2019; Time: 11:23 AM

## 2019-07-28 NOTE — Telephone Encounter (Signed)
Pt was called and no voicemail available.

## 2019-07-28 NOTE — Addendum Note (Signed)
Addended by: Gillis Santa on: 07/28/2019 11:24 AM   Modules accepted: Orders, Level of Service

## 2019-08-03 ENCOUNTER — Other Ambulatory Visit: Payer: Self-pay

## 2019-08-03 ENCOUNTER — Ambulatory Visit
Admission: RE | Admit: 2019-08-03 | Discharge: 2019-08-03 | Disposition: A | Discharge status: Home / Self Care | Payer: Medicare Other | Source: Ambulatory Visit | Attending: Student in an Organized Health Care Education/Training Program | Admitting: Student in an Organized Health Care Education/Training Program

## 2019-08-03 ENCOUNTER — Ambulatory Visit (HOSPITAL_BASED_OUTPATIENT_CLINIC_OR_DEPARTMENT_OTHER): Payer: Medicare Other | Admitting: Student in an Organized Health Care Education/Training Program

## 2019-08-03 ENCOUNTER — Encounter: Payer: Self-pay | Admitting: Student in an Organized Health Care Education/Training Program

## 2019-08-03 VITALS — BP 113/50 | HR 63 | Temp 98.0°F | Resp 17 | Ht 69.0 in | Wt 295.0 lb

## 2019-08-03 DIAGNOSIS — G8929 Other chronic pain: Secondary | ICD-10-CM | POA: Diagnosis not present

## 2019-08-03 DIAGNOSIS — M5416 Radiculopathy, lumbar region: Secondary | ICD-10-CM

## 2019-08-03 MED ORDER — ROPIVACAINE HCL 2 MG/ML IJ SOLN
INTRAMUSCULAR | Status: AC
  Filled 2019-08-03: qty 10

## 2019-08-03 MED ORDER — FENTANYL CITRATE (PF) 100 MCG/2ML IJ SOLN
INTRAMUSCULAR | Status: AC
  Filled 2019-08-03: qty 2

## 2019-08-03 MED ORDER — SODIUM CHLORIDE (PF) 0.9 % IJ SOLN
INTRAMUSCULAR | Status: AC
  Filled 2019-08-03: qty 10

## 2019-08-03 MED ORDER — IOHEXOL 180 MG/ML  SOLN
10.0000 mL | Freq: Once | INTRAMUSCULAR | Status: AC
  Administered 2019-08-03: 10 mL via EPIDURAL

## 2019-08-03 MED ORDER — FENTANYL CITRATE (PF) 100 MCG/2ML IJ SOLN
25.0000 ug | INTRAMUSCULAR | Status: DC | PRN
  Administered 2019-08-03: 50 ug via INTRAVENOUS

## 2019-08-03 MED ORDER — IOHEXOL 180 MG/ML  SOLN
INTRAMUSCULAR | Status: AC
  Filled 2019-08-03: qty 20

## 2019-08-03 MED ORDER — MIDAZOLAM HCL 5 MG/5ML IJ SOLN
1.0000 mg | INTRAMUSCULAR | Status: DC | PRN
  Administered 2019-08-03: 1 mg via INTRAVENOUS

## 2019-08-03 MED ORDER — ROPIVACAINE HCL 2 MG/ML IJ SOLN
2.0000 mL | Freq: Once | INTRAMUSCULAR | Status: AC
  Administered 2019-08-03: 2 mL via EPIDURAL

## 2019-08-03 MED ORDER — SODIUM CHLORIDE 0.9% FLUSH
2.0000 mL | Freq: Once | INTRAVENOUS | Status: AC
  Administered 2019-08-03: 2 mL

## 2019-08-03 MED ORDER — DEXAMETHASONE SODIUM PHOSPHATE 10 MG/ML IJ SOLN
10.0000 mg | Freq: Once | INTRAMUSCULAR | Status: AC
  Administered 2019-08-03: 10 mg
  Filled 2019-08-03: qty 1

## 2019-08-03 MED ORDER — LIDOCAINE HCL 2 % IJ SOLN
20.0000 mL | Freq: Once | INTRAMUSCULAR | Status: AC
  Administered 2019-08-03: 400 mg
  Filled 2019-08-03: qty 20

## 2019-08-03 MED ORDER — MIDAZOLAM HCL 5 MG/5ML IJ SOLN
INTRAMUSCULAR | Status: AC
  Filled 2019-08-03: qty 5

## 2019-08-03 NOTE — Patient Instructions (Signed)

## 2019-08-03 NOTE — Progress Notes (Signed)
Safety precautions to be maintained throughout the outpatient stay will include: orient to surroundings, keep bed in low position, maintain call bell within reach at all times, provide assistance with transfer out of bed and ambulation.  

## 2019-08-03 NOTE — Progress Notes (Signed)
PROVIDER NOTE: Information contained herein reflects review and annotations entered in association with encounter. Interpretation of such information and data should be left to medically-trained personnel. Information provided to patient can be located elsewhere in the medical record under "Patient Instructions". Document created using STT-dictation technology, any transcriptional errors that may result from process are unintentional.    Patient: Mario [name]  Service Category: Procedure  Provider: Gillis Santa, MD  DOB: 03-20-1950  DOS: 08/03/2019  Location: [facility]  MRN: [id number]  Setting: Ambulatory - outpatient  Referring Provider: Baxter Hire, MD  Type: Established Patient  Specialty: Interventional Pain Management  PCP: Baxter Hire, MD   Primary Reason for Visit: Interventional Pain Management Treatment. CC: Back Pain  Procedure:          Anesthesia, Analgesia, Anxiolysis:  Type: Diagnostic Inter-Laminar Epidural Steroid Injection  #2  Region: Lumbar Level: L3-4 Level. Laterality: Midline         Type: Moderate (Conscious) Sedation combined with Local Anesthesia Indication(s): Analgesia and Anxiety Route: Intravenous (IV) IV Access: Secured Sedation: Meaningful verbal contact was maintained at all times during the procedure  Local Anesthetic: Lidocaine 1-2%  Position: Prone with head of the table was raised to facilitate breathing.   Indications: 1. Chronic radicular lumbar pain   2. Lumbar radiculopathy    Pain Score: Pre-procedure: 7 /10 Post-procedure: 0-No pain/10   Pre-op Assessment:  Mario [name] is a 69 y.o. (year old), male patient, seen today for interventional treatment. He  has a past surgical history that includes Back surgery; Replacement total knee (Left); Bone marrow transplant; Total hip arthroplasty (Right, 01/26/2018); Colonoscopy with propofol (N/A, 04/13/2018); Esophagogastroduodenoscopy (egd) with propofol (N/A, 04/13/2018); Joint  replacement; and Total hip revision (Right, 11/02/2018). Mario [name] has a current medication list which includes the following prescription(s): bayer aspirin ec low dose, calcium-magnesium-zinc, cholecalciferol, cyanocobalamin, glimepiride, magnesium, metformin, pravastatin, trazodone, vitamin c, and zinc gluconate, and the following Facility-Administered Medications: fentanyl and midazolam. His primarily concern today is the Back Pain  Initial Vital Signs:  Pulse/HCG Rate: 63ECG Heart Rate: 70 Temp: 98.1 F (36.7 C) Resp: (!) 23 BP: 132/73 SpO2: 98 %  BMI: Estimated body mass index is 43.56 kg/m as calculated from the following:   Height as of this encounter: 5\' 9"  (1.753 m).   Weight as of this encounter: 295 lb (133.8 kg).  Risk Assessment: Allergies: Reviewed. He is allergic to levaquin [levofloxacin].  Allergy Precautions: None required Coagulopathies: Reviewed. None identified.  Blood-thinner therapy: None at this time Active Infection(s): Reviewed. None identified. Mario [name] is afebrile  Site Confirmation: Mario [name] was asked to confirm the procedure and laterality before marking the site Procedure checklist: Completed Consent: Before the procedure and under the influence of no sedative(s), amnesic(s), or anxiolytics, the patient was informed of the treatment options, risks and possible complications. To fulfill our ethical and legal obligations, as recommended by the American Medical Association's Code of Ethics, I have informed the patient of my clinical impression; the nature and purpose of the treatment or procedure; the risks, benefits, and possible complications of the intervention; the alternatives, including doing nothing; the risk(s) and benefit(s) of the alternative treatment(s) or procedure(s); and the risk(s) and benefit(s) of doing nothing. The patient was provided information about the general risks and possible complications associated with the procedure. These may  include, but are not limited to: failure to achieve desired goals, infection, bleeding, organ or nerve damage, allergic reactions, paralysis, and death. In addition, the patient  was informed of those risks and complications associated to Spine-related procedures, such as failure to decrease pain; infection (i.e.: Meningitis, epidural or intraspinal abscess); bleeding (i.e.: epidural hematoma, subarachnoid hemorrhage, or any other type of intraspinal or peri-dural bleeding); organ or nerve damage (i.e.: Any type of peripheral nerve, nerve root, or spinal cord injury) with subsequent damage to sensory, motor, and/or autonomic systems, resulting in permanent pain, numbness, and/or weakness of one or several areas of the body; allergic reactions; (i.e.: anaphylactic reaction); and/or death. Furthermore, the patient was informed of those risks and complications associated with the medications. These include, but are not limited to: allergic reactions (i.e.: anaphylactic or anaphylactoid reaction(s)); adrenal axis suppression; blood sugar elevation that in diabetics may result in ketoacidosis or comma; water retention that in patients with history of congestive heart failure may result in shortness of breath, pulmonary edema, and decompensation with resultant heart failure; weight gain; swelling or edema; medication-induced neural toxicity; particulate matter embolism and blood vessel occlusion with resultant organ, and/or nervous system infarction; and/or aseptic necrosis of one or more joints. Finally, the patient was informed that Medicine is not an exact science; therefore, there is also the possibility of unforeseen or unpredictable risks and/or possible complications that may result in a catastrophic outcome. The patient indicated having understood very clearly. We have given the patient no guarantees and we have made no promises. Enough time was given to the patient to ask questions, all of which were answered  to the patient's satisfaction. Mario [name] has indicated that he wanted to continue with the procedure. Attestation: I, the ordering provider, attest that I have discussed with the patient the benefits, risks, side-effects, alternatives, likelihood of achieving goals, and potential problems during recovery for the procedure that I have provided informed consent. Date  Time: 08/03/2019 11:00 AM  Pre-Procedure Preparation:  Monitoring: As per clinic protocol. Respiration, ETCO2, SpO2, BP, heart rate and rhythm monitor placed and checked for adequate function Safety Precautions: Patient was assessed for positional comfort and pressure points before starting the procedure. Time-out: I initiated and conducted the "Time-out" before starting the procedure, as per protocol. The patient was asked to participate by confirming the accuracy of the "Time Out" information. Verification of the correct person, site, and procedure were performed and confirmed by me, the nursing staff, and the patient. "Time-out" conducted as per Joint Commission's Universal Protocol (UP.01.01.01). Time: 1155  Description of Procedure:          Target Area: The interlaminar space, initially targeting the lower laminar border of the superior vertebral body. Approach: Paramedial approach. Area Prepped: Entire Posterior Lumbar Region DuraPrep (Iodine Povacrylex [0.7% available iodine] and Isopropyl Alcohol, 74% w/w) Safety Precautions: Aspiration looking for blood return was conducted prior to all injections. At no point did we inject any substances, as a needle was being advanced. No attempts were made at seeking any paresthesias. Safe injection practices and needle disposal techniques used. Medications properly checked for expiration [name]. SDV (single dose vial) medications used. Description of the Procedure: Protocol guidelines were followed. The procedure needle was introduced through the skin, ipsilateral to the reported pain, and  advanced to the target area. Bone was contacted and the needle walked caudad, until the lamina was cleared. The epidural space was identified using "loss-of-resistance technique" with 2-3 ml of PF-NaCl (0.9% NSS), in a 5cc LOR glass syringe.  Vitals:   08/03/19 1211 08/03/19 1217 08/03/19 1227 08/03/19 1238  BP:  (!) 117/52 124/62 (!) 113/50  Pulse:  Resp: 19 17 15 17   Temp:  97.9 F (36.6 C)  98 F (36.7 C)  SpO2: 96% 96% 98% 97%  Weight:      Height:        Start Time: 1155 hrs. End Time: 1210 hrs.  Materials:  Needle(s) Type: Epidural needle Gauge: 17G Length: 3.5-in Medication(s): Please see orders for medications and dosing details.  7 cc solution made of 3 cc of preservative-free saline, 3 cc of 0.2% ropivacaine, 1 cc of Decadron 10 mg/cc.  Imaging Guidance (Spinal):          Type of Imaging Technique: Fluoroscopy Guidance (Spinal) Indication(s): Assistance in needle guidance and placement for procedures requiring needle placement in or near specific anatomical locations not easily accessible without such assistance. Exposure Time: Please see nurses notes. Contrast: Before injecting any contrast, we confirmed that the patient did not have an allergy to iodine, shellfish, or radiological contrast. Once satisfactory needle placement was completed at the desired level, radiological contrast was injected. Contrast injected under live fluoroscopy. No contrast complications. See chart for type and volume of contrast used. Fluoroscopic Guidance: I was personally present during the use of fluoroscopy. "Tunnel Vision Technique" used to obtain the best possible view of the target area. Parallax error corrected before commencing the procedure. "Direction-depth-direction" technique used to introduce the needle under continuous pulsed fluoroscopy. Once target was reached, antero-posterior, oblique, and lateral fluoroscopic projection used confirm needle placement in all planes. Images  permanently stored in EMR. Interpretation: I personally interpreted the imaging intraoperatively. Adequate needle placement confirmed in multiple planes. Appropriate spread of contrast into desired area was observed. No evidence of afferent or efferent intravascular uptake. No intrathecal or subarachnoid spread observed. Permanent images saved into the patient's record.  Antibiotic Prophylaxis:   Anti-infectives (From admission, onward)   None     Indication(s): None identified  Post-operative Assessment:  Post-procedure Vital Signs:  Pulse/HCG Rate: 6365 Temp: 98 F (36.7 C) Resp: 17 BP: (!) 113/50 SpO2: 97 %  EBL: None  Complications: No immediate post-treatment complications observed by team, or reported by patient.  Note: The patient tolerated the entire procedure well. A repeat set of vitals were taken after the procedure and the patient was kept under observation following institutional policy, for this type of procedure. Post-procedural neurological assessment was performed, showing return to baseline, prior to discharge. The patient was provided with post-procedure discharge instructions, including a section on how to identify potential problems. Should any problems arise concerning this procedure, the patient was given instructions to immediately contact us, at any time, without hesitation. In any case, we plan to contact the patient by telephone for a follow-up status report regarding this interventional procedure.  Comments:  No additional relevant information.  5 out of 5 strength bilateral lower extremity: Plantar flexion, dorsiflexion, knee flexion, knee extension.  Plan of Care  Orders:  Orders Placed This Encounter  Procedures  . DG PAIN CLINIC C-ARM 1-60 MIN NO REPORT    Intraoperative interpretation by procedural physician at [facility].    Standing Status:   Standing    Number of Occurrences:   1    Order Specific Question:   Reason for exam:     Answer:   Assistance in needle guidance and placement for procedures requiring needle placement in or near specific anatomical locations not easily accessible without such assistance.   Medications ordered for procedure: Meds ordered this encounter  Medications  . iohexol (OMNIPAQUE) 180 MG/ML injection 10 mL  Must be Myelogram-compatible. If not available, you may substitute with a water-soluble, non-ionic, hypoallergenic, myelogram-compatible radiological contrast medium.  Marland Kitchen lidocaine (XYLOCAINE) 2 % (with pres) injection 400 mg  . midazolam (VERSED) 5 MG/5ML injection 1-2 mg    Make sure Flumazenil is available in the pyxis when using this medication. If oversedation occurs, administer 0.2 mg IV over 15 sec. If after 45 sec no response, administer 0.2 mg again over 1 min; may repeat at 1 min intervals; not to exceed 4 doses (1 mg)  . fentaNYL (SUBLIMAZE) injection 25-50 mcg    Make sure Narcan is available in the pyxis when using this medication. In the event of respiratory depression (RR< 8/min): Titrate NARCAN (naloxone) in increments of 0.1 to 0.2 mg IV at 2-3 minute intervals, until desired degree of reversal.  . ropivacaine (PF) 2 mg/mL (0.2%) (NAROPIN) injection 2 mL  . sodium chloride flush (NS) 0.9 % injection 2 mL  . dexamethasone (DECADRON) injection 10 mg   Medications administered: We administered iohexol, lidocaine, midazolam, fentaNYL, ropivacaine (PF) 2 mg/mL (0.2%), sodium chloride flush, and dexamethasone.  See the medical record for exact dosing, route, and time of administration.  Follow-up plan:   Return in about 4 weeks (around 08/31/2019) for Post Procedure Evaluation, virtual.      Status post bilateral SI joint injection on 05/23/2019- not helpful L3/4 and L4/5 spinal stenosis (L5/S1 fusion)-L3-L4 ESI #1 on 06/13/2019, #2 08/03/2019    Recent Visits Date Type Provider Dept  07/28/19 Office Visit Gillis Santa, MD [facility]  06/13/19 Procedure visit  Gillis Santa, MD [facility]  05/31/19 Office Visit Gillis Santa, MD [facility]  05/23/19 Procedure visit Gillis Santa, MD [facility]  05/16/19 Office Visit Gillis Santa, MD [facility]  Showing recent visits within past 90 days and meeting all other requirements   Today's Visits Date Type Provider Dept  08/03/19 Procedure visit Gillis Santa, MD [facility]  Showing today's visits and meeting all other requirements   Future Appointments Date Type Provider Dept  08/31/19 Appointment Gillis Santa, MD [facility]  Showing future appointments within next 90 days and meeting all other requirements   Disposition: Discharge home  Discharge (Date  Time): 08/03/2019; 1239 hrs.   Primary Care Physician: Baxter Hire, MD Location: [facility] Note by: Gillis Santa, MD Date: 08/03/2019; Time: 3:24 PM  Disclaimer:  Medicine is not an Chief Strategy Officer. The only guarantee in medicine is that nothing is guaranteed. It is important to note that the decision to proceed with this intervention was based on the information collected from the patient. The Data and conclusions were drawn from the patient's questionnaire, the interview, and the physical examination. Because the information was provided in large part by the patient, it cannot be guaranteed that it has not been purposely or unconsciously manipulated. Every effort has been made to obtain as much relevant data as possible for this evaluation. It is important to note that the conclusions that lead to this procedure are derived in large part from the available data. Always take into account that the treatment will also be dependent on availability of resources and existing treatment guidelines, considered by other Pain Management Practitioners as being common knowledge and practice, at the time of the intervention. For Medico-Legal purposes, it is also  important to point out that variation in procedural techniques and pharmacological choices are the acceptable norm. The indications, contraindications, technique, and results of  the above procedure should only be interpreted and judged by a Board-Certified Interventional Pain Specialist with extensive familiarity and expertise in the same exact procedure and technique.

## 2019-08-04 ENCOUNTER — Telehealth: Payer: Self-pay

## 2019-08-04 NOTE — Telephone Encounter (Signed)
No answer- Left message to call if needed 

## 2019-08-24 ENCOUNTER — Encounter: Payer: Self-pay | Admitting: Student in an Organized Health Care Education/Training Program

## 2019-08-24 DIAGNOSIS — G8929 Other chronic pain: Secondary | ICD-10-CM

## 2019-08-24 DIAGNOSIS — M5136 Other intervertebral disc degeneration, lumbar region: Secondary | ICD-10-CM

## 2019-08-24 DIAGNOSIS — M48062 Spinal stenosis, lumbar region with neurogenic claudication: Secondary | ICD-10-CM

## 2019-08-24 DIAGNOSIS — M961 Postlaminectomy syndrome, not elsewhere classified: Secondary | ICD-10-CM

## 2019-08-24 DIAGNOSIS — G894 Chronic pain syndrome: Secondary | ICD-10-CM

## 2019-08-24 DIAGNOSIS — M51369 Other intervertebral disc degeneration, lumbar region without mention of lumbar back pain or lower extremity pain: Secondary | ICD-10-CM

## 2019-08-24 DIAGNOSIS — M5416 Radiculopathy, lumbar region: Secondary | ICD-10-CM

## 2019-08-30 ENCOUNTER — Telehealth: Payer: Self-pay

## 2019-08-30 NOTE — Telephone Encounter (Signed)
Called patient to review post procedure. No answer, Left message to call us so we can get some information for his VV

## 2019-08-31 ENCOUNTER — Other Ambulatory Visit: Payer: Self-pay

## 2019-08-31 ENCOUNTER — Ambulatory Visit
Payer: Medicare Other | Attending: Student in an Organized Health Care Education/Training Program | Admitting: Student in an Organized Health Care Education/Training Program

## 2019-08-31 DIAGNOSIS — G894 Chronic pain syndrome: Secondary | ICD-10-CM

## 2019-08-31 DIAGNOSIS — Z981 Arthrodesis status: Secondary | ICD-10-CM

## 2019-08-31 DIAGNOSIS — M48062 Spinal stenosis, lumbar region with neurogenic claudication: Secondary | ICD-10-CM

## 2019-08-31 DIAGNOSIS — G8929 Other chronic pain: Secondary | ICD-10-CM

## 2019-08-31 DIAGNOSIS — M5416 Radiculopathy, lumbar region: Secondary | ICD-10-CM | POA: Diagnosis not present

## 2019-08-31 NOTE — Progress Notes (Signed)
Patient: Mario [name]  Service Category: E/M  Provider: Gillis Santa, MD  DOB: [birth year]  DOS: 08/31/2019  Location: Office  MRN: [id number]  Setting: Ambulatory outpatient  Referring Provider: Baxter Hire, MD  Type: Established Patient  Specialty: Interventional Pain Management  PCP: Baxter Hire, MD  Location: Home  Delivery: TeleHealth     Virtual Encounter - Pain Management PROVIDER NOTE: Information contained herein reflects review and annotations entered in association with encounter. Interpretation of such information and data should be left to medically-trained personnel. Information provided to patient can be located elsewhere in the medical record under "Patient Instructions". Document created using STT-dictation technology, any transcriptional errors that may result from process are unintentional.    Contact & Pharmacy Preferred: [phone] Home: [phone] (home) Mobile: [phone] (mobile) E-mail: [email or web address] .[email or web address], [place] [address] [place] [address] Phone: [phone] Fax: [phone]   Pre-screening  Mr. Pavek offered "in-person" vs "virtual" encounter. He indicated preferring virtual for this encounter.   Reason COVID-19*   Social distancing based on CDC and AMA recommendations.   I contacted Rubert Frediani on 08/31/2019 via televisit.      I clearly identified myself as Gillis Santa, MD. I verified that I was speaking with the correct person using two identifiers (Name: Legrande Hao, and date of birth: [birth year]).  Consent I sought verbal advanced consent from Darden Dates for virtual visit interactions. I informed Mr. Fenter of possible security and privacy concerns, risks, and limitations associated with providing "not-in-person" medical evaluation and management services. I also informed Mr. Badley of the availability of "in-person" appointments. Finally, I informed him that there would be a charge  for the virtual visit and that he could be  personally, fully or partially, financially responsible for it. Mr. Laur expressed understanding and agreed to proceed.   Historic Elements   Mr. Randie Tallarico is a 69 y.o. year old, male patient evaluated today after his last contact with our practice on 08/30/2019. Mr. Swamy  has a past medical history of Anginal pain ([place]), Arthritis, Back pain, Claustrophobia, Depression, Diabetes mellitus without complication ([place]), Dyspnea, Elevated lipids, Elevated lipids, Environmental and seasonal allergies, Fatty liver, GERD (gastroesophageal reflux disease), Glaucoma, Hepatitis, HH (hiatus hernia), Hip pain (Right), History of kidney stones, Knee pain, Panic attacks, and Sleep apnea. He also  has a past surgical history that includes Back surgery; Replacement total knee (Left); Bone marrow transplant; Total hip arthroplasty (Right, 01/26/2018); Colonoscopy with propofol (N/A, 04/13/2018); Esophagogastroduodenoscopy (egd) with propofol (N/A, 04/13/2018); Joint replacement; and Total hip revision (Right, 11/02/2018). Mr. Ritchie has a current medication list which includes the following prescription(s): bayer aspirin ec low dose, calcium-magnesium-zinc, cholecalciferol, cyanocobalamin, glimepiride, magnesium, metformin, pravastatin, trazodone, vitamin c, and zinc gluconate. He  reports that he has never smoked. He has never used smokeless tobacco. He reports previous alcohol use. He reports that he does not use drugs. Mr. Fricker is allergic to levaquin [levofloxacin].   HPI  Today, he is being contacted for a post-procedure assessment.  Mr. Bunyard follows up virtually for postprocedural evaluation status post right lumbar epidural steroid injection #2.  Patient has a history of L5-S1 fusion with right lumbar radicular pain.  He states that the epidural injection was helpful for the first 7 to 10 days with gradual return of pain thereafter.  He states that at this point in time, he  is back to his preintervention pain level.  At this point, we have completed to  diagnostic lumbar epidural steroid injections with limited benefit.  In regards to future therapeutic options, we discussed spinal cord stimulation.  Patient has a history of claustrophobia and will require an open MRI to do a thoracic and lumbar MRI for SCS trial planning.  Patient does have diffuse lumbar spinal stenosis and arthropathy that was noted on his previous CT of his lumbar spine.  Would like to evaluate his thoracic spine for stenosis prior to considering spinal cord stimulator trial.  We also discussed having a representative from [facility] reach out to him to discuss SCS trial details and maybe even put him in contact with a previous trial patient.  Patient states that he will do some research on his own regarding spinal cord stimulation and will contact our clinic if he would like to pursue work-up.   Laboratory Chemistry Profile   Renal Lab Results  Component Value Date   BUN 9 11/03/2018   CREATININE 0.80 11/03/2018   GFRAA >60 11/03/2018   GFRNONAA >60 11/03/2018     Hepatic No results found for: AST, ALT, ALBUMIN, ALKPHOS, HCVAB, AMYLASE, LIPASE, AMMONIA   Electrolytes Lab Results  Component Value Date   NA 137 11/03/2018   K 5.1 11/03/2018   CL 104 11/03/2018   CALCIUM 8.6 (L) 11/03/2018     Bone No results found for: VD25OH, VE938BO1BPZ, WC5852DP8, EU2353IR4, 25OHVITD1, 25OHVITD2, 25OHVITD3, TESTOFREE, TESTOSTERONE   Inflammation (CRP: Acute Phase) (ESR: Chronic Phase) Lab Results  Component Value Date   ESRSEDRATE 22 (H) 10/26/2018       Note: Above Lab results reviewed.    Assessment  The primary encounter diagnosis was Chronic radicular lumbar pain. Diagnoses of Lumbar radiculopathy, Spinal stenosis, lumbar region, with neurogenic claudication, History of lumbar fusion  (Hx of L5/S1 in 2000), and Chronic pain syndrome were also pertinent to this visit.  Plan of Care     Status post 2 diagnostic lumbar epidural steroid injections at L3-L4 with limited analgesic and functional benefit.  In regards to treatment plan, discussed spinal cord stimulator trial.  Patient states that he will do some research on his own before considering this.  If we do plan for trial, patient will need thoracic and lumbar MRI in an open machine as the patient is claustrophobic.  Patient will follow up as needed if he is interested in repeating lumbar epidural steroid injection or pursuing SCS trial.  Follow-up plan:   Return if symptoms worsen or fail to improve.     Status post bilateral SI joint injection on 05/23/2019- not helpful L3/4 and L4/5 spinal stenosis (L5/S1 fusion)-L3-L4 ESI #1 on 06/13/2019, #2 08/03/2019     Recent Visits Date Type Provider Dept  08/03/19 Procedure visit Gillis Santa, MD [facility]  07/28/19 Office Visit Gillis Santa, MD [facility]  06/13/19 Procedure visit Gillis Santa, MD [facility]  Showing recent visits within past 90 days and meeting all other requirements Today's Visits Date Type Provider Dept  08/31/19 Telemedicine Gillis Santa, MD [facility]  Showing today's visits and meeting all other requirements Future Appointments No visits were found meeting these conditions. Showing future appointments within next 90 days and meeting all other requirements  I discussed the assessment and treatment plan with the patient. The patient was provided an opportunity to ask questions and all were answered. The patient agreed with the plan and demonstrated an understanding of the instructions.  Patient advised to call back or seek an in-person evaluation if the symptoms  or condition worsens.  Duration of encounter: 20 minutes.  Note by: Gillis Santa, MD Date: 08/31/2019; Time: 2:53 PM

## 2019-09-05 ENCOUNTER — Other Ambulatory Visit: Payer: Self-pay | Admitting: *Deleted

## 2019-09-06 DIAGNOSIS — M961 Postlaminectomy syndrome, not elsewhere classified: Secondary | ICD-10-CM | POA: Insufficient documentation

## 2019-09-06 NOTE — Telephone Encounter (Signed)
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  Patient has talked with [facility] scientific spinal cord stimulator representative and is interested in pursuing work-up of spinal cord stimulator trial.  We will order thoracic and lumbar MRI for trial and implant planning as well as referral to psychology for implant evaluation.

## 2019-09-21 ENCOUNTER — Other Ambulatory Visit: Payer: Self-pay | Admitting: Student in an Organized Health Care Education/Training Program

## 2019-09-21 DIAGNOSIS — M5416 Radiculopathy, lumbar region: Secondary | ICD-10-CM

## 2019-09-21 DIAGNOSIS — G8929 Other chronic pain: Secondary | ICD-10-CM

## 2019-09-21 NOTE — Progress Notes (Unsigned)
Orders Placed This Encounter  Procedures  . Ambulatory referral to Psychology    Referral Priority:   Routine    Referral Type:   Psychiatric    Referral Reason:   Specialty Services Required    Referred to Provider:   Renaee Munda, PhD    Requested Specialty:   Psychology    Number of Visits Requested:   1

## 2019-10-26 ENCOUNTER — Encounter: Payer: Self-pay | Admitting: Student in an Organized Health Care Education/Training Program

## 2019-10-31 ENCOUNTER — Telehealth: Payer: Self-pay | Admitting: Student in an Organized Health Care Education/Training Program

## 2019-10-31 DIAGNOSIS — F411 Generalized anxiety disorder: Secondary | ICD-10-CM

## 2019-10-31 MED ORDER — DIAZEPAM 5 MG PO TABS
5.0000 mg | ORAL_TABLET | Freq: Once | ORAL | 0 refills | Status: DC | PRN
Start: 2019-10-31 — End: 2022-11-11

## 2019-10-31 NOTE — Telephone Encounter (Signed)
Dr Holley Raring, patient would like some medication so that he can tolerate there MRI due to claustrophobia.

## 2019-10-31 NOTE — Addendum Note (Signed)
Addended by: Gillis Santa on: 10/31/2019 03:57 PM   Modules accepted: Orders

## 2019-10-31 NOTE — Telephone Encounter (Signed)
Patient called inquiring about having his MRI's for SCS trial set up. He is extremely claustrophobic and will need strong meds to (knock him out) his words, to be able to complete MRI  Patient is scheduled at this time for Sept 17-21 1:20 arrival time at [facility].  Patient states he is going to try to find a place to schedule sooner appt. As this has been going on for a year. This is the first I knew about it today. I gave him [facility] phone number to cancel if he finds another place.

## 2019-11-09 ENCOUNTER — Telehealth: Payer: Self-pay | Admitting: Student in an Organized Health Care Education/Training Program

## 2019-11-09 DIAGNOSIS — G894 Chronic pain syndrome: Secondary | ICD-10-CM

## 2019-11-09 DIAGNOSIS — M5136 Other intervertebral disc degeneration, lumbar region: Secondary | ICD-10-CM

## 2019-11-09 DIAGNOSIS — M5134 Other intervertebral disc degeneration, thoracic region: Secondary | ICD-10-CM

## 2019-11-09 DIAGNOSIS — M961 Postlaminectomy syndrome, not elsewhere classified: Secondary | ICD-10-CM

## 2019-11-09 DIAGNOSIS — Z981 Arthrodesis status: Secondary | ICD-10-CM

## 2019-11-09 DIAGNOSIS — M5416 Radiculopathy, lumbar region: Secondary | ICD-10-CM

## 2019-11-09 DIAGNOSIS — G8929 Other chronic pain: Secondary | ICD-10-CM

## 2019-11-09 DIAGNOSIS — M48062 Spinal stenosis, lumbar region with neurogenic claudication: Secondary | ICD-10-CM

## 2019-11-09 NOTE — Telephone Encounter (Signed)
Patient called stating he wants to get his MRI done at [facility] w/sedation. This requires a different order for the MRI. He has not picked up the medication sent to pharmacy by Dr. Holley Raring and this can be canceled. I left vmail with Tosha in specialty scheduling, asking what kind of order has to be sent in. Dr. Holley Raring may know this already.

## 2019-11-15 NOTE — Telephone Encounter (Signed)
[place] and cancelled valium 5 mg to be taken prior to MRI.

## 2019-11-15 NOTE — Telephone Encounter (Signed)
We need order from Dr. Holley Raring for IV sedation MRI at [facility] [place]

## 2019-11-22 ENCOUNTER — Other Ambulatory Visit: Payer: PRIVATE HEALTH INSURANCE

## 2019-11-22 ENCOUNTER — Telehealth: Payer: Self-pay | Admitting: Student in an Organized Health Care Education/Training Program

## 2019-11-22 NOTE — Telephone Encounter (Signed)
Mr. Tout has orders to have MRI at [facility] with IV sedation. He is set for October 7 at 8am to arrive at [facility] for anesthesia  We have to get him in for evaluation before that and also he needs creatine labs drawn  Zacarias Pontes is faxing over some forms to be filled out as well and will need them back by Friday the week before his Oct appt. I will give those to Nurses and will keep a copy   Nothing to Eat or Drink after Midnight  Covid Test at [place] 9:30 on 12-12-19  I am calling him with all this information and to make him an appt.  Also calling insurance to Prior Auth these 2 procedures.   Do you want both MRI's w/wo contrast ?   If so make sure both orders say this.   Thank you for your patience

## 2019-11-22 NOTE — Telephone Encounter (Signed)
Yes I am okay with all that.  Thoracic and lumbar spine MRI with and without contrast.

## 2019-11-24 ENCOUNTER — Encounter: Payer: Self-pay | Admitting: Student in an Organized Health Care Education/Training Program

## 2019-11-24 ENCOUNTER — Other Ambulatory Visit: Payer: Self-pay

## 2019-11-24 ENCOUNTER — Ambulatory Visit
Payer: Medicare Other | Attending: Student in an Organized Health Care Education/Training Program | Admitting: Student in an Organized Health Care Education/Training Program

## 2019-11-24 VITALS — BP 136/66 | HR 91 | Temp 97.2°F | Resp 16 | Ht 69.0 in | Wt 295.0 lb

## 2019-11-24 DIAGNOSIS — M961 Postlaminectomy syndrome, not elsewhere classified: Secondary | ICD-10-CM | POA: Diagnosis present

## 2019-11-24 DIAGNOSIS — G8929 Other chronic pain: Secondary | ICD-10-CM

## 2019-11-24 DIAGNOSIS — M48062 Spinal stenosis, lumbar region with neurogenic claudication: Secondary | ICD-10-CM | POA: Diagnosis present

## 2019-11-24 DIAGNOSIS — M5416 Radiculopathy, lumbar region: Secondary | ICD-10-CM

## 2019-11-24 DIAGNOSIS — G894 Chronic pain syndrome: Secondary | ICD-10-CM | POA: Diagnosis present

## 2019-11-24 NOTE — Progress Notes (Signed)
PROVIDER NOTE: Information contained herein reflects review and annotations entered in association with encounter. Interpretation of such information and data should be left to medically-trained personnel. Information provided to patient can be located elsewhere in the medical record under "Patient Instructions". Document created using STT-dictation technology, any transcriptional errors that may result from process are unintentional.    Patient: Mario [name] Dates  Service Category: E/M  Provider: Gillis Santa, MD  DOB: Dec 30, 1950  DOS: 11/24/2019  Specialty: Interventional Pain Management  MRN: [id number]  Setting: Ambulatory outpatient  PCP: Baxter Hire, MD  Type: Established Patient    Referring Provider: Baxter Hire, MD  Location: Office  Delivery: Face-to-face     HPI  Reason for encounter: [name], a 69 y.o. year old male, is here today for evaluation and management of his Chronic radicular lumbar pain [M54.16, G89.29]. Mr. Younge primary complain today is Back Pain (mid back) Last encounter: Practice (11/22/2019). My last encounter with him was on 11/22/2019. Pertinent problems: [name] has Status post total hip replacement, right; S/P revision of total hip; SI joint arthritis; Sacroiliac joint pain; History of lumbar fusion  (Hx of L5/S1 in 2000); Spinal stenosis, lumbar region, with neurogenic claudication; Chronic radicular lumbar pain; Lumbar facet arthropathy; Chronic pain syndrome; and Failed back surgical syndrome on their pertinent problem list. Pain Assessment: Severity of Chronic pain is reported as a 7 /10. Location: Back Mid, Left/mid back down to left hip and groin. Onset: More than a month ago. Quality: Aching, Burning, Shooting, Stabbing. Timing: Constant. Modifying factor(s): nothing. Vitals:  height is 5' 9" (1.753 m) and weight is 295 lb (133.8 kg). His temporal temperature is 97.2 F (36.2 C) (abnormal). His blood pressure is 136/66 and his pulse is 91. His  respiration is 16 and oxygen saturation is 100%.   Patient presents today for a face-to-face visit that is required prior to his inpatient imaging studies which include a thoracic and lumbar MRI with and without contrast for spinal cord stimulator trial and implant planning.  Patient has a history of L5-S1 lumbar spinal fusion in 2000.  He has had worsening lumbar radicular pain that has been refractory to medication management, physical therapy, injections including lumbar epidural steroid injections.  He is being considered for thoracolumbar spinal cord stimulator trial and would like to obtain thoracic MRI to rule out thoracic canal stenosis to ensure that he will be able to tolerate percutaneous or paddle lead during trial and also for implant purposes.  We will also like to obtain lumbar MRI for spinal cord stimulator trial/implant planning.  We will also obtain creatinine and BUN since patient is receiving IV contrast with his MRI which will be under sedation.  ROS  Constitutional: Denies any fever or chills Gastrointestinal: No reported hemesis, hematochezia, vomiting, or acute GI distress Musculoskeletal: Low back, bilateral leg pain, right greater than left. Neurological: No reported episodes of acute onset apraxia, aphasia, dysarthria, agnosia, amnesia, paralysis, loss of coordination, or loss of consciousness  Medication Review  Calcium-Magnesium-Zinc, Cholecalciferol, Cyanocobalamin, Magnesium, aspirin, diazepam, glimepiride, metFORMIN, pravastatin, traZODone, vitamin C, and zinc gluconate  History Review  Allergy: [name] is allergic to levaquin [levofloxacin]. Drug: [name]  reports no history of drug use. Alcohol:  reports previous alcohol use. Tobacco:  reports that he has never smoked. He has never used smokeless tobacco. Social: [name]  reports that he has never smoked. He has never used smokeless tobacco. He reports previous alcohol use. He reports that he does  not use  drugs. Medical:  has a past medical history of Anginal pain ([place]), Arthritis, Back pain, Claustrophobia, Depression, Diabetes mellitus without complication ([place]), Dyspnea, Elevated lipids, Elevated lipids, Environmental and seasonal allergies, Fatty liver, GERD (gastroesophageal reflux disease), Glaucoma, Hepatitis, HH (hiatus hernia), Hip pain (Right), History of kidney stones, Knee pain, Panic attacks, and Sleep apnea. Surgical: [name]  has a past surgical history that includes Back surgery; Replacement total knee (Left); Bone marrow transplant; Total hip arthroplasty (Right, 01/26/2018); Colonoscopy with propofol (N/A, 04/13/2018); Esophagogastroduodenoscopy (egd) with propofol (N/A, 04/13/2018); Joint replacement; and Total hip revision (Right, 11/02/2018). Family: family history is not on file.  Laboratory Chemistry Profile   Renal Lab Results  Component Value Date   BUN 9 11/03/2018   CREATININE 0.80 11/03/2018   GFRAA >60 11/03/2018   GFRNONAA >60 11/03/2018     Hepatic No results found for: AST, ALT, ALBUMIN, ALKPHOS, HCVAB, AMYLASE, LIPASE, AMMONIA   Electrolytes Lab Results  Component Value Date   NA 137 11/03/2018   K 5.1 11/03/2018   CL 104 11/03/2018   CALCIUM 8.6 (L) 11/03/2018     Bone No results found for: VD25OH, UX324MW1UUV, OZ3664QI3, KV4259DG3, 25OHVITD1, 25OHVITD2, 25OHVITD3, TESTOFREE, TESTOSTERONE   Inflammation (CRP: Acute Phase) (ESR: Chronic Phase) Lab Results  Component Value Date   ESRSEDRATE 22 (H) 10/26/2018       Note: Above Lab results reviewed.    Physical Exam  General appearance: Well nourished, well developed, and well hydrated. In no apparent acute distress Mental status: Alert, oriented x 3 (person, place, & time)       Respiratory: No evidence of acute respiratory distress Eyes: PERLA Vitals: BP 136/66 (BP Location: Right Arm, Patient Position: Sitting, Cuff Size: Large)   Pulse 91   Temp (!) 97.2 F (36.2 C) (Temporal)   Resp 16    Ht 5' 9" (1.753 m)   Wt 295 lb (133.8 kg)   SpO2 100%   BMI 43.56 kg/m  BMI: Estimated body mass index is 43.56 kg/m as calculated from the following:   Height as of this encounter: 5' 9" (1.753 m).   Weight as of this encounter: 295 lb (133.8 kg). Ideal: Ideal body weight: 70.7 kg (155 lb 13.8 oz) Adjusted ideal body weight: 95.9 kg (211 lb 8.3 oz)   Lumbar Spine Area Exam  Skin & Axial Inspection: Well healed scar from previous spine surgery detected Alignment: Asymmetric Functional ROM: Pain restricted ROM affecting both sides Stability: No instability detected Muscle Tone/Strength: Functionally intact. No obvious neuro-muscular anomalies detected. Sensory (Neurological): Dermatomal pain pattern  Gait & Posture Assessment  Ambulation: Limited Gait: Antalgic Posture: Difficulty standing up straight, due to pain  Lower Extremity Exam    Side: Right lower extremity  Side: Left lower extremity  Stability: No instability observed          Stability: No instability observed          Skin & Extremity Inspection: Evidence of prior arthroplastic surgery  Skin & Extremity Inspection: Skin color, temperature, and hair growth are WNL. No peripheral edema or cyanosis. No masses, redness, swelling, asymmetry, or associated skin lesions. No contractures.  Functional ROM: Pain restricted ROM for hip and knee joints          Functional ROM: Unrestricted ROM                  Muscle Tone/Strength: Functionally intact. No obvious neuro-muscular anomalies detected.  Muscle Tone/Strength: Functionally intact. No obvious neuro-muscular anomalies  detected.  Sensory (Neurological): Dermatomal pain pattern        Sensory (Neurological): Unimpaired        DTR: Patellar: deferred today Achilles: deferred today Plantar: deferred today  DTR: Patellar: deferred today Achilles: deferred today Plantar: deferred today  Palpation: No palpable anomalies  Palpation: No palpable anomalies    Assessment    Status Diagnosis  Persistent Persistent Persistent 1. Chronic radicular lumbar pain   2. Lumbar radiculopathy   3. Spinal stenosis, lumbar region, with neurogenic claudication   4. Failed back surgical syndrome   5. Chronic pain syndrome      Updated Problems: Problem  Failed Back Surgical Syndrome  Si Joint Arthritis  Sacroiliac Joint Pain  History of lumbar fusion  (Hx of L5/S1 in 2000)  Spinal Stenosis, Lumbar Region, With Neurogenic Claudication  Chronic Radicular Lumbar Pain  Lumbar Facet Arthropathy  Chronic Pain Syndrome  S/P Revision of Total Hip  Status Post Total Hip Replacement, Right    Plan of Care    1.  Continue with thoracic and lumbar spinal MRI with and without contrast for spinal cord stimulator trial work-up.  Patient does have severe anxiety and will be doing this procedure under general anesthesia. 2.  Follow-up after imaging studies to discuss suitability for spinal cord stimulator trial.  Given patient's severe procedural anxiety, we have discussed whether this procedure will be done with me under moderate sedation at the interventional pain clinic or whether he will have his spinal cord stimulator trial with neurosurgery under general anesthesia.  Patient prefers to do this with me however I have concerns whether he will be able to tolerate it under moderate sedation.  We will discuss further after imaging studies. 3.  BUN and creatinine prior to MRI studies.  Orders:  Orders Placed This Encounter  Procedures  . MR THORACIC SPINE W WO CONTRAST    Standing Status:   Future    Standing Expiration Date:   02/23/2020    Order Specific Question:   GRA to provide read?    Answer:   Yes    Order Specific Question:   If indicated for the ordered procedure, I authorize the administration of contrast media per Radiology protocol    Answer:   Yes    Order Specific Question:   What is the patient's sedation requirement?    Answer:   General Anesthesia  (available ONLY at [facility])    Order Specific Question:   Does the patient have a pacemaker or implanted devices?    Answer:   No    Order Specific Question:   Preferred imaging location?    Answer:   [facility] (table limit - 500 lbs)    Order Specific Question:   Call Results- Best Contact Number?    Answer:   [phone] ([facility])    Order Specific Question:   Radiology Contrast Protocol - do NOT remove file path    Answer:   _0 [email or web address]  . [facility] Neighbours    Order Specific Question:   Release to patient    Answer:   Immediate   Follow-up plan:   Return for Please have patient call to make face-to-face appointment after his MRI.     Status post bilateral SI joint injection on 05/23/2019- not helpful L3/4 and L4/5 spinal stenosis (L5/S1 fusion)-L3-L4 ESI #1 on 06/13/2019, #2 08/03/2019      Recent Visits Date Type Provider Dept  08/31/19 Telemedicine Gillis Santa, MD [facility]  Showing recent visits within past 90 days and meeting all other requirements Today's Visits Date Type Provider Dept  11/24/19 Office Visit Gillis Santa, MD [facility]  Showing today's visits and meeting all other requirements Future Appointments No visits were found meeting these conditions. Showing future appointments within next 90 days and meeting all other requirements  I discussed the assessment and treatment plan with the patient. The patient was provided an opportunity to ask questions and all were answered. The patient agreed with the plan and demonstrated an understanding of the instructions.  Patient advised to call back or seek an in-person evaluation if the symptoms or condition worsens.  Duration of encounter: 20 minutes.  Note by: Gillis Santa, MD Date: 11/24/2019; Time: 2:43 PM

## 2019-11-25 ENCOUNTER — Other Ambulatory Visit: Payer: PRIVATE HEALTH INSURANCE

## 2019-11-25 LAB — BUN+CREAT
BUN/Creatinine Ratio: 14 (ref 10–24)
BUN: 12 mg/dL (ref 8–27)
Creatinine, Ser: 0.87 mg/dL (ref 0.76–1.27)
GFR calc Af Amer: 101 mL/min/{1.73_m2} (ref >59)
GFR calc non Af Amer: 87 mL/min/{1.73_m2} (ref >59)

## 2019-12-12 ENCOUNTER — Other Ambulatory Visit
Admission: RE | Admit: 2019-12-12 | Discharge: 2019-12-12 | Disposition: A | Discharge status: Home / Self Care | Payer: Medicare Other | Source: Ambulatory Visit | Attending: Student in an Organized Health Care Education/Training Program | Admitting: Student in an Organized Health Care Education/Training Program

## 2019-12-12 ENCOUNTER — Other Ambulatory Visit: Payer: Self-pay

## 2019-12-12 DIAGNOSIS — Z20822 Contact with and (suspected) exposure to covid-19: Secondary | ICD-10-CM | POA: Insufficient documentation

## 2019-12-12 DIAGNOSIS — Z01812 Encounter for preprocedural laboratory examination: Secondary | ICD-10-CM | POA: Diagnosis present

## 2019-12-12 LAB — SARS CORONAVIRUS 2 (TAT 6-24 HRS): SARS Coronavirus 2: NEGATIVE

## 2019-12-13 ENCOUNTER — Encounter (HOSPITAL_COMMUNITY): Payer: Self-pay | Admitting: *Deleted

## 2019-12-13 ENCOUNTER — Other Ambulatory Visit: Payer: Self-pay

## 2019-12-13 NOTE — Progress Notes (Signed)
Mario [name] denies chest pain or shortness of breath. Patient was tested for Covid 12/13/19 and has been in quarantine since that time. Mario [name] was seen for chest  by [facility] cardiology 01/2019 and 2/21.  I requested EKG. Mario [name] states that he does  have chest pain, occassionaly. Patient can not tell me when the last time, " I have pain all over, so it really does not really bother me much. Mario [name] has type II diabetes.  Mario [name] states that CBGs run 120's.  I instructed patient to not take diabetes medications on Thursday am.

## 2019-12-14 NOTE — Anesthesia Preprocedure Evaluation (Addendum)
Anesthesia Evaluation  Patient identified by MRN, date of birth, ID band Patient awake    Reviewed: Allergy & Precautions, H&P , NPO status , Patient's Chart, lab work & pertinent test results  History of Anesthesia Complications (+) PROLONGED EMERGENCE and history of anesthetic complications  Airway Mallampati: II  TM Distance: >3 FB Neck ROM: Full    Dental no notable dental hx. (+) Poor Dentition, Missing, Caps   Pulmonary shortness of breath, sleep apnea and Continuous Positive Airway Pressure Ventilation ,    Pulmonary exam normal breath sounds clear to auscultation       Cardiovascular Exercise Tolerance: Good + angina Normal cardiovascular exam Rhythm:Regular Rate:Normal  evaluation with 04/14/19 with Doristine Mango, PA for dyspnea, chest heaviness, palpitation follow-up. Recent stress and Holter monitor unremarkable. She noted, "He's had an extensive cardiac workup in the past, including multiple stress test and a cardiac catheterization, revealing no significant CAD.    Neuro/Psych PSYCHIATRIC DISORDERS Anxiety Depression negative neurological ROS  negative psych ROS   GI/Hepatic hiatal hernia, GERD  Medicated,(+) Hepatitis -  Endo/Other  diabetes, Type 2  Renal/GU negative Renal ROS  negative genitourinary   Musculoskeletal  (+) Arthritis , Osteoarthritis and Rheumatoid disorders,    Abdominal   Peds negative pediatric ROS (+)  Hematology negative hematology ROS (+)   Anesthesia Other Findings   Reproductive/Obstetrics negative OB ROS                           Anesthesia Physical Anesthesia Plan  ASA: III  Anesthesia Plan: General   Post-op Pain Management:    Induction: Intravenous  PONV Risk Score and Plan: 2  Airway Management Planned: Oral ETT and LMA  Additional Equipment:   Intra-op Plan:   Post-operative Plan: Extubation in OR  Informed Consent: I have  reviewed the patients History and Physical, chart, labs and discussed the procedure including the risks, benefits and alternatives for the proposed anesthesia with the patient or authorized representative who has indicated his/her understanding and acceptance.       Plan Discussed with: Anesthesiologist and CRNA  Anesthesia Plan Comments: (PAT note written 12/14/2019 by Myra Gianotti, PA-C. )       Anesthesia Quick Evaluation

## 2019-12-14 NOTE — Progress Notes (Signed)
Anesthesia Chart Review:  Case: [id number] Date/Time: 12/15/19 0800   Procedure: MRI LUMBAR SPINE WITH AND WITHOUT CONTRAST; THORASIC SPINE WITHOUT CONTRAST (N/A )   Anesthesia type: General   Pre-op diagnosis: LOW BACK PAIN;   Location: [facility] OR RADIOLOGY ROOM / [facility] OR   Surgeons: Radiologist, Medication, MD      DISCUSSION: Patient is a 69 year old [name] scheduled for the above procedure. MRI thoracic and lumbar spine ordered by Mario Santa, MD. H&P 11/24/19.  History includes never smoker, dyspnea, claustrophobia, panic attacks, DM2, GERD, hiatal hernia, chest pain (stress test 2016, 04/2019; prior cath "no significant CAD"), glaucoma, hepatitis "A", fatty liver, cirrhosis (normal PLT count, LFTs 07/07/19), OSA (CPAP), THA (right 01/26/18, revision 11/02/18). Indicated he was sensitive to anesthesia.  Reported rare alcohol use.  Patient is followed by cardiologist Dr. Ubaldo Glassing. Last evaluation with 04/14/19 with Mario Mango, PA for dyspnea, chest heaviness, palpitation follow-up. Recent stress and Holter monitor unremarkable. She noted, "He's had an extensive cardiac workup in the past, including multiple stress test and a cardiac catheterization, revealing no significant CAD. Most recent stress test was completed in 2016." A new stress test ordered and was done on 05/04/19 and was non-ischemic.   He is a same day work-up. 12/12/2019 Preprocedure COVID-19 test negative.  Anesthesia team to evaluate on the day of procedure.    VS:   Blood Pressure 134/78 11/29/2019 11:05 AM EDT  Pulse 71 11/29/2019 11:05 AM EDT  Oxygen Saturation 98% 11/29/2019 11:05 AM EDT  Weight 136 kg (299 lb 12.8 oz) 11/29/2019 11:05 AM EDT  Height 175.3 cm (5\' 9" ) 11/29/2019 11:05 AM EDT  Body Mass Index 44.27 11/29/2019 11:05 AM EDT     PROVIDERS: Baxter Hire, MD is PCP [facility], [facility] Everywhere) Olean Ree, MD is GI [facility], [place]) Bartholome Bill, MD is cardiologist [facility], [place])   LABS: Last labs 11/22/19 through [facility] (see Care Everywhere).  Results included glucose 143, sodium 139, carbon dioxide 32.3, calcium 9.0, BUN 11, creatinine 0.8, A1c 6.8%.  CBC on 07/07/19 showed WBC 15.4, hemoglobin 13.9, hematocrit 41.8, platelet count 198 and had normal LFTs at that time.   EKG: 01/14/19 Mario [name]): NSR   CV: Nuclear stress test 05/04/19 ([facility]): Impression: ETT showed no ischemia or arrhythmia. Stress sestamibi images showed no  hypoperfusion. EF normal. Low risk study.  Echo 04/07/19 ([facility]): INTERPRETATION  NORMAL LEFT VENTRICULAR SYSTOLIC FUNCTION  NORMAL RIGHT VENTRICULAR SYSTOLIC FUNCTION  TRIVIAL REGURGITATION NOTED (See above)  NO VALVULAR STENOSIS  Closest EF: >55% (Estimated)  Mitral: TRIVIAL MR  Tricuspid: TRIVIAL TR   According to 04/14/19 note with Mario Mango, PA ([facility]), "Holter monitor on 01/14/19 revealed predominately normal sinus rhythm with an average HR of 69bpm. There were rare PVCs and PACs. No prolonged pauses."    Past Medical History:  Diagnosis Date   Anginal pain ([place])    Arthritis    Back pain    lower   Cirrhosis ([place])    Claustrophobia    Complication of anesthesia    "does not take much medication."   Depression    Diabetes mellitus without complication ([place])    type II   Dyspnea    Elevated lipids    Elevated lipids    Environmental and seasonal allergies    Fatty liver    GERD (gastroesophageal reflux disease)    Glaucoma    Hepatitis    type A  HH (hiatus hernia)    Hip pain Right   History of kidney stones    cystoscopy   Knee pain    right   Panic attacks    Pneumonia    as a child   Sleep apnea    CPAP    Past Surgical History:  Procedure Laterality Date   BACK SURGERY     L4-L5 fusion   Bone graft autograft  1954   hip to wrist bilateral. Had tumors on wrist.   COLONOSCOPY WITH PROPOFOL N/A 04/13/2018   Procedure:  COLONOSCOPY WITH PROPOFOL;  Surgeon: [name], Mario Pike, MD;  Location: [facility] ENDOSCOPY;  Service: Gastroenterology;  Laterality: N/A;   ESOPHAGOGASTRODUODENOSCOPY (EGD) WITH PROPOFOL N/A 04/13/2018   Procedure: ESOPHAGOGASTRODUODENOSCOPY (EGD) WITH PROPOFOL;  Surgeon: [name], Mario Pike, MD;  Location: [facility] ENDOSCOPY;  Service: Gastroenterology;  Laterality: N/A;   JOINT REPLACEMENT     REPLACEMENT TOTAL KNEE Left    TOTAL HIP ARTHROPLASTY Right 01/26/2018   Procedure: TOTAL HIP ARTHROPLASTY ANTERIOR APPROACH;  Surgeon: Mario Knows, MD;  Location: [facility] ORS;  Service: Orthopedics;  Laterality: Right;   TOTAL HIP REVISION Right 11/02/2018   Procedure: TOTAL HIP REVISION WITH EXCISION OF HETEROTOPIC OSSIFICATION;  Surgeon: Mario Knows, MD;  Location: [facility] ORS;  Service: Orthopedics;  Laterality: Right;    MEDICATIONS: No current facility-administered medications for this encounter.    Ascorbic Acid (VITAMIN C) 1000 MG tablet   BAYER ASPIRIN EC LOW DOSE 81 MG EC tablet   Camphor-Eucalyptus-Menthol (VICKS VAPORUB EX)   Cholecalciferol (DIALYVITE VITAMIN D 5000) 125 MCG (5000 UT) capsule   glimepiride (AMARYL) 4 MG tablet   Magnesium 400 MG CAPS   meloxicam (MOBIC) 15 MG tablet   metFORMIN (GLUCOPHAGE) 1000 MG tablet   Oxymetazoline HCl (AFRIN 12 HOUR NA)   pantoprazole (PROTONIX) 40 MG tablet   PEPPERMINT OIL PO   pravastatin (PRAVACHOL) 40 MG tablet   traZODone (DESYREL) 100 MG tablet   vitamin B-12 (CYANOCOBALAMIN) 1000 MCG tablet   zinc gluconate 50 MG tablet   diazepam (VALIUM) 5 MG tablet   tiZANidine (ZANAFLEX) 4 MG tablet    Mario Gianotti, PA-C Surgical Short Stay/Anesthesiology [facility] Phone [phone] [facility] Phone [phone] 12/14/2019 11:58 AM

## 2019-12-15 ENCOUNTER — Encounter (HOSPITAL_COMMUNITY)
Admission: RE | Disposition: A | Discharge status: Home / Self Care | Payer: Self-pay | Source: Ambulatory Visit | Attending: Student in an Organized Health Care Education/Training Program

## 2019-12-15 ENCOUNTER — Ambulatory Visit (HOSPITAL_COMMUNITY)
Admission: RE | Admit: 2019-12-15 | Discharge: 2019-12-15 | Disposition: A | Discharge status: Home / Self Care | Payer: Medicare Other | Source: Ambulatory Visit | Attending: Student in an Organized Health Care Education/Training Program | Admitting: Student in an Organized Health Care Education/Training Program

## 2019-12-15 ENCOUNTER — Ambulatory Visit (HOSPITAL_COMMUNITY): Payer: Medicare Other | Admitting: Certified Registered Nurse Anesthetist

## 2019-12-15 ENCOUNTER — Ambulatory Visit (HOSPITAL_COMMUNITY): Admission: RE | Admit: 2019-12-15 | Payer: Medicare Other | Source: Ambulatory Visit

## 2019-12-15 ENCOUNTER — Encounter (HOSPITAL_COMMUNITY): Payer: Self-pay

## 2019-12-15 ENCOUNTER — Other Ambulatory Visit: Payer: Self-pay

## 2019-12-15 DIAGNOSIS — Z981 Arthrodesis status: Secondary | ICD-10-CM | POA: Insufficient documentation

## 2019-12-15 DIAGNOSIS — M961 Postlaminectomy syndrome, not elsewhere classified: Secondary | ICD-10-CM | POA: Insufficient documentation

## 2019-12-15 DIAGNOSIS — M5416 Radiculopathy, lumbar region: Secondary | ICD-10-CM

## 2019-12-15 DIAGNOSIS — M48062 Spinal stenosis, lumbar region with neurogenic claudication: Secondary | ICD-10-CM

## 2019-12-15 DIAGNOSIS — G8929 Other chronic pain: Secondary | ICD-10-CM

## 2019-12-15 DIAGNOSIS — M5136 Other intervertebral disc degeneration, lumbar region: Secondary | ICD-10-CM

## 2019-12-15 DIAGNOSIS — M47816 Spondylosis without myelopathy or radiculopathy, lumbar region: Secondary | ICD-10-CM | POA: Insufficient documentation

## 2019-12-15 DIAGNOSIS — G894 Chronic pain syndrome: Secondary | ICD-10-CM | POA: Diagnosis not present

## 2019-12-15 HISTORY — DX: Unspecified cirrhosis of liver: K74.60

## 2019-12-15 HISTORY — DX: Other complications of anesthesia, initial encounter: T88.59XA

## 2019-12-15 HISTORY — DX: Pneumonia, unspecified organism: J18.9

## 2019-12-15 HISTORY — PX: RADIOLOGY WITH ANESTHESIA: SHX6223

## 2019-12-15 LAB — COMPREHENSIVE METABOLIC PANEL
ALT: 28 U/L (ref 0–44)
AST: 31 U/L (ref 15–41)
Albumin: 3.4 g/dL — ABNORMAL LOW (ref 3.5–5.0)
Alkaline Phosphatase: 80 U/L (ref 38–126)
Anion gap: 10 (ref 5–15)
BUN: 8 mg/dL (ref 8–23)
CO2: 23 mmol/L (ref 22–32)
Calcium: 8.8 mg/dL — ABNORMAL LOW (ref 8.9–10.3)
Chloride: 103 mmol/L (ref 98–111)
Creatinine, Ser: 0.78 mg/dL (ref 0.61–1.24)
GFR calc non Af Amer: >60 mL/min (ref >60)
Glucose, Bld: 123 mg/dL — ABNORMAL HIGH (ref 70–99)
Potassium: 3.9 mmol/L (ref 3.5–5.1)
Sodium: 136 mmol/L (ref 135–145)
Total Bilirubin: 0.4 mg/dL (ref 0.3–1.2)
Total Protein: 6.9 g/dL (ref 6.5–8.1)

## 2019-12-15 LAB — GLUCOSE, CAPILLARY
Glucose-Capillary: 131 mg/dL — ABNORMAL HIGH (ref 70–99)
Glucose-Capillary: 152 mg/dL — ABNORMAL HIGH (ref 70–99)

## 2019-12-15 IMAGING — MR MR THORACIC SPINE WO/W CM
7 of 17 series · 17 of 48 positions shown · IV contrast (Yes GAD)
Comparison: CT of the lumbar spine [DATE].

CLINICAL DATA: Spinal stenosis, lumbar region, with neurogenic
claudication. Failed back surgical syndrome; Chronic pain syndrome.

EXAM:
MRI THORACIC AND LUMBAR SPINE WITHOUT AND WITH CONTRAST
TECHNIQUE: Multiplanar and multiecho pulse sequences of the thoracic and lumbar
spine were obtained without and with intravenous contrast.
CONTRAST:  10mL GADAVIST GADOBUTROL 1 MMOL/ML IV SOLN

[Series 3: T1 · sagittal · 3.0mm · 0.90mm/px · 1 of 12 slices shown (1 of 3)]
[im 1/12]
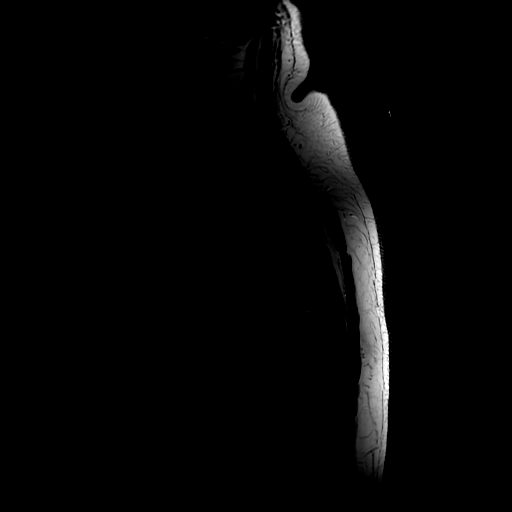

[Series 4: T2 · sagittal · 3.0mm · 0.66mm/px · 1 of 15 slices shown (1 of 4)]
[im 1/15]
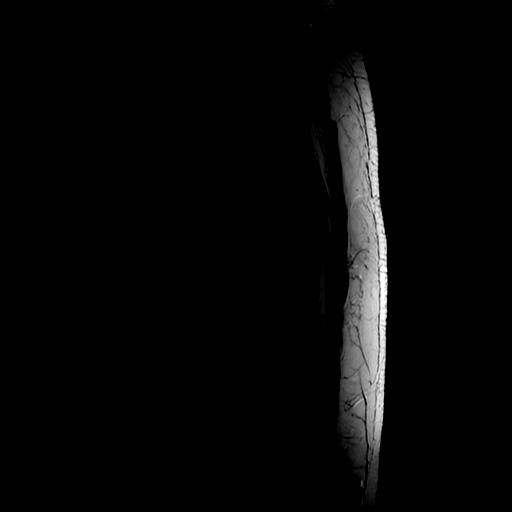

[Series 6: T1 · sagittal · 3.0mm · 0.66mm/px · 1 of 15 slices shown (2 of 3)]
[im 1/15]
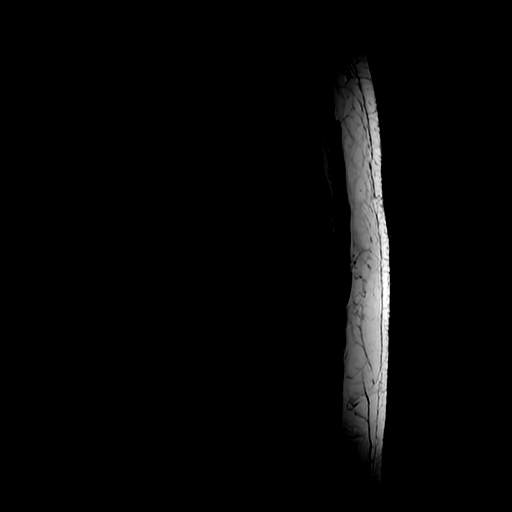

[Series 9: T2 · axial · 4.0mm · 0.39mm/px · z∈[-252,+13]mm · 5 of 40 slices shown (2 of 4)]
[im 1/40]
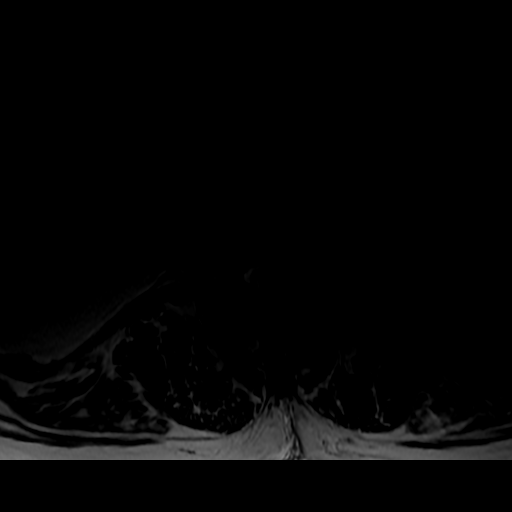
[im 10/40]
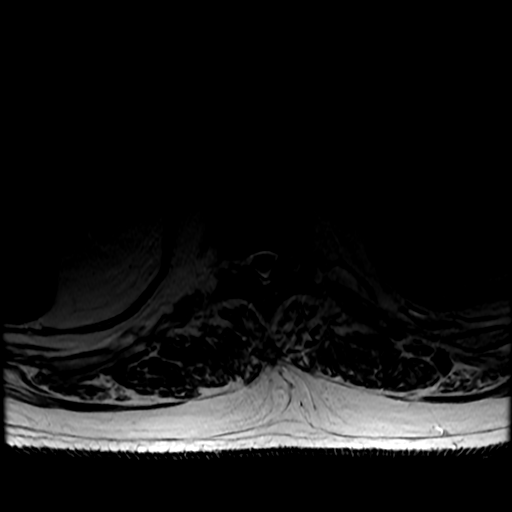
[im 20/40]
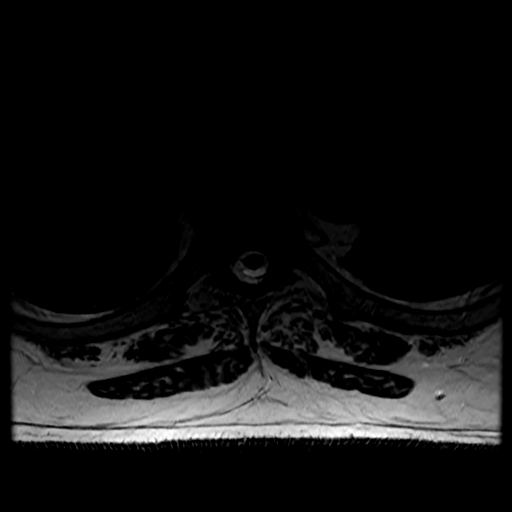
[im 30/40]
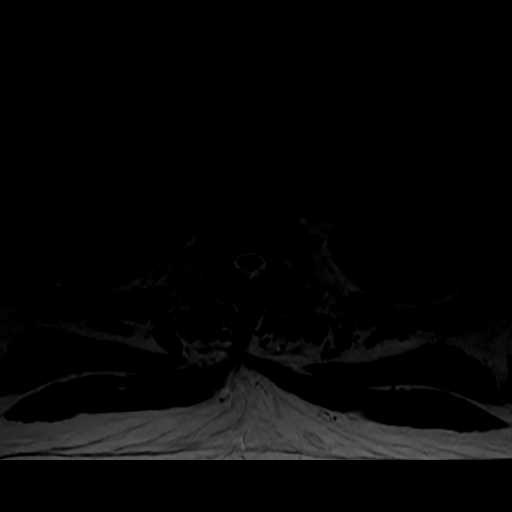
[im 40/40]
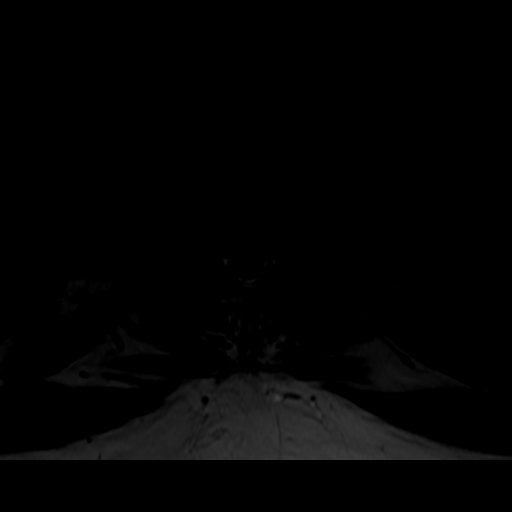

[Series 10: T1 · axial · non-contrast · 4.0mm · 0.39mm/px · z∈[-252,-101]mm · 3 of 40 slices shown (3 of 3)]
[im 1/40]
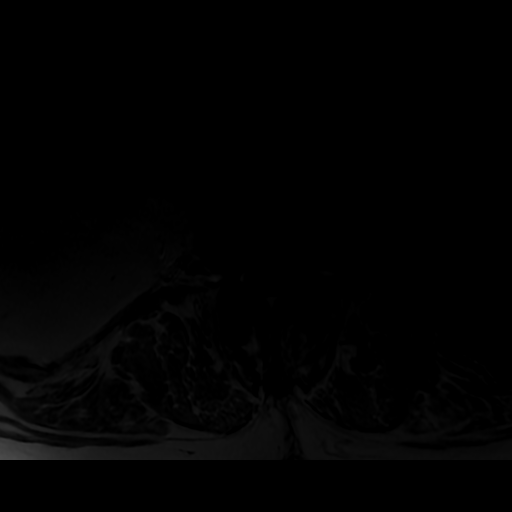
[im 10/40]
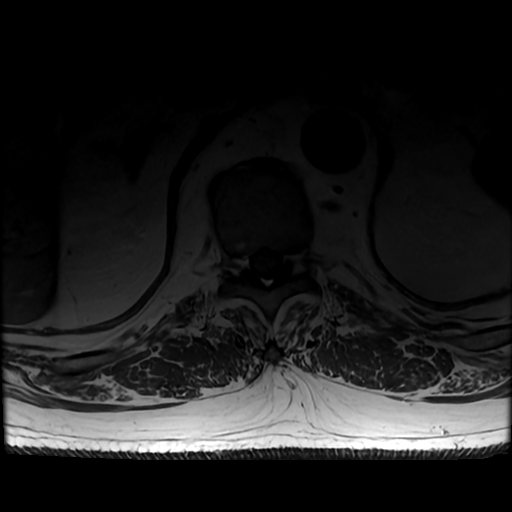
[im 20/40]
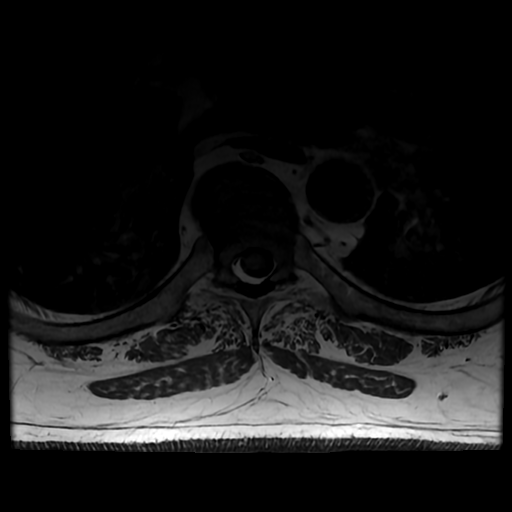

[Series 13: T2 · sagittal · 4.0mm · 0.55mm/px · 2 of 15 slices shown (3 of 4)]
[im 1/15]
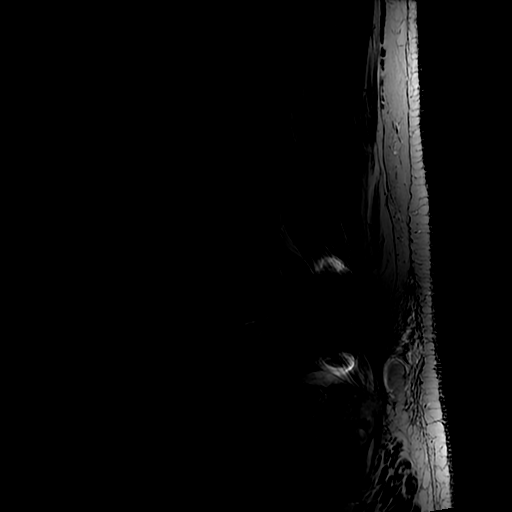
[im 15/15]
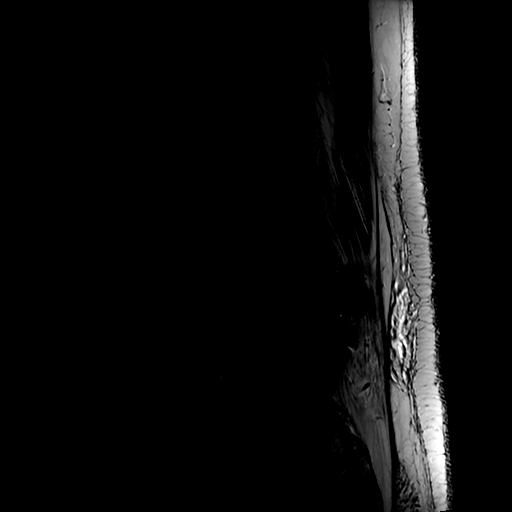

[Series 16: T2 · axial · 4.0mm · 0.39mm/px · z∈[-466,-281]mm · 4 of 32 slices shown (4 of 4)]
[im 1/32]
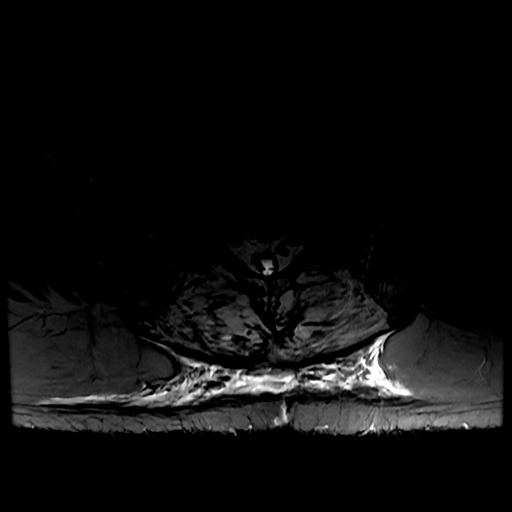
[im 11/32]
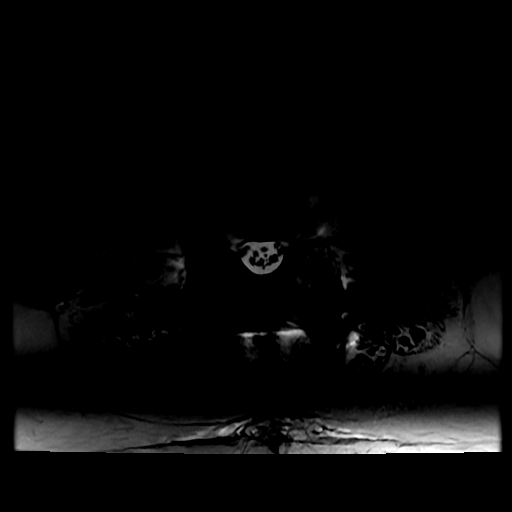
[im 21/32]
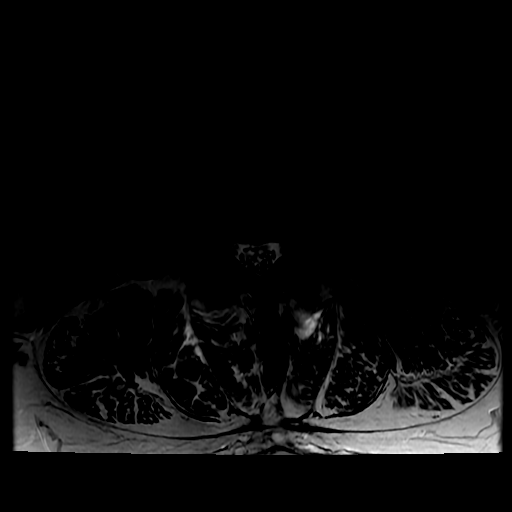
[im 32/32]
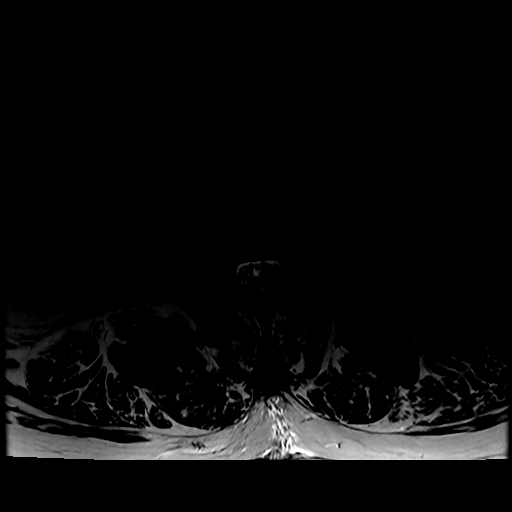

[17 of 48 positions shown; findings below may reference images not displayed]

FINDINGS: MRI THORACIC SPINE FINDINGS

Alignment:  Physiologic.

Vertebrae: No fracture, evidence of discitis, or bone lesion.
Hemangiomas in the T6, T8 and T9 vertebral bodies.

Cord:  Normal signal and morphology.

Paraspinal and other soft tissues: Bilateral posterior atelectasis.

Disc levels:

At T10-11, there are moderate facet degenerative changes with
prominent ligamentum flavum redundancy which results in mild
narrowing of the spinal canal and mild bilateral neural foraminal
narrowing.

Mild facet degenerative changes throughout the remainder of the
thoracic spine without significant spinal canal or neural foraminal
stenosis.

MRI LUMBAR SPINE FINDINGS

Segmentation:  Standard.

Alignment:  Minimal anterolisthesis of L3 over L4.

Vertebrae: Mild edema and contrast enhancement in the anterior
aspect of the L2 vertebral body, likely degenerative. There is no
change in size of this vertebral body when compared to prior CT. No
acute fracture, evidence of discitis, or bone lesion. Postsurgical
changes from interbody fusion, posterior fixation and laminectomy at
L4-5.

Conus medullaris: Extends to the T12-L1 level and appears normal.
Mild clumping of the roots of the cauda equina at the L4-5 level.

Paraspinal and other soft tissues: Negative.

Disc levels:

T12-L1: Small posterior disc protrusion and hypertrophic facet
degenerative changes without significant spinal canal or neural
foraminal stenosis.

L1-2: Shallow disc bulge and moderate facet degenerative changes
with ligamentum flavum redundancy without significant spinal canal
or neural foraminal stenosis.

L2-3: Mild loss of disc height, disc bulge, moderate facet
degenerative changes and ligamentum flavum redundancy resulting in
moderate spinal canal stenosis with narrowing of the bilateral
subarticular zones and mild bilateral neural foraminal narrowing.

L3-4: Disc bulge/disc uncovering, prominent hypertrophic facet
degenerative changes and ligamentum flavum redundancy resulting in
severe spinal canal stenosis and mild bilateral neural foraminal
narrowing.

L4-5: Status post fusion. No spinal canal or neural foraminal
stenosis. Clumping of the nerve roots of served at this level
(series 16, images [DATE] represent arachnoiditis.

L5-S1: Facet degenerative changes. No spinal canal or neural
foraminal stenosis.
IMPRESSION: 1. Postsurgical changes from interbody fusion, posterior fixation
and laminectomy at L4-5.
2. Multilevel degenerative changes of the lumbar spine with severe
spinal canal stenosis at L3-4 and moderate spinal canal stenosis at
L2-3.
3. Mild clumping of the nerve roots of the cauda equina at the L4-5
level may represent arachnoiditis.
4. No high-grade spinal canal stenosis of thoracic spine.

## 2019-12-15 SURGERY — MRI WITH ANESTHESIA
Anesthesia: General

## 2019-12-15 MED ORDER — SUCCINYLCHOLINE CHLORIDE 200 MG/10ML IV SOSY
PREFILLED_SYRINGE | INTRAVENOUS | Status: AC
  Filled 2019-12-15: qty 10

## 2019-12-15 MED ORDER — PROPOFOL 10 MG/ML IV BOLUS
INTRAVENOUS | Status: DC | PRN
  Administered 2019-12-15: 200 mg via INTRAVENOUS

## 2019-12-15 MED ORDER — DEXAMETHASONE SODIUM PHOSPHATE 10 MG/ML IJ SOLN
INTRAMUSCULAR | Status: DC | PRN
  Administered 2019-12-15: 5 mg via INTRAVENOUS

## 2019-12-15 MED ORDER — ROCURONIUM BROMIDE 10 MG/ML (PF) SYRINGE
PREFILLED_SYRINGE | INTRAVENOUS | Status: AC
  Filled 2019-12-15: qty 10

## 2019-12-15 MED ORDER — LACTATED RINGERS IV SOLN: INTRAVENOUS | Status: DC

## 2019-12-15 MED ORDER — SUCCINYLCHOLINE CHLORIDE 200 MG/10ML IV SOSY
PREFILLED_SYRINGE | INTRAVENOUS | Status: DC | PRN
  Administered 2019-12-15: 200 mg via INTRAVENOUS

## 2019-12-15 MED ORDER — LABETALOL HCL 5 MG/ML IV SOLN
INTRAVENOUS | Status: AC
  Filled 2019-12-15: qty 4

## 2019-12-15 MED ORDER — MIDAZOLAM HCL 2 MG/2ML IJ SOLN
INTRAMUSCULAR | Status: DC | PRN
  Administered 2019-12-15 (×2): 1 mg via INTRAVENOUS

## 2019-12-15 MED ORDER — ONDANSETRON HCL 4 MG/2ML IJ SOLN
INTRAMUSCULAR | Status: DC | PRN
  Administered 2019-12-15: 4 mg via INTRAVENOUS

## 2019-12-15 MED ORDER — PHENYLEPHRINE 40 MCG/ML (10ML) SYRINGE FOR IV PUSH (FOR BLOOD PRESSURE SUPPORT)
PREFILLED_SYRINGE | INTRAVENOUS | Status: AC
  Filled 2019-12-15: qty 10

## 2019-12-15 MED ORDER — SUGAMMADEX SODIUM 200 MG/2ML IV SOLN
INTRAVENOUS | Status: DC | PRN
  Administered 2019-12-15: 200 mg via INTRAVENOUS

## 2019-12-15 MED ORDER — EPHEDRINE 5 MG/ML INJ
INTRAVENOUS | Status: AC
  Filled 2019-12-15: qty 10

## 2019-12-15 MED ORDER — GLYCOPYRROLATE PF 0.2 MG/ML IJ SOSY
PREFILLED_SYRINGE | INTRAMUSCULAR | Status: AC
  Filled 2019-12-15: qty 1

## 2019-12-15 MED ORDER — LIDOCAINE 2% (20 MG/ML) 5 ML SYRINGE
INTRAMUSCULAR | Status: DC | PRN
  Administered 2019-12-15: 80 mg via INTRAVENOUS

## 2019-12-15 MED ORDER — DEXAMETHASONE SODIUM PHOSPHATE 10 MG/ML IJ SOLN
INTRAMUSCULAR | Status: AC
  Filled 2019-12-15: qty 1

## 2019-12-15 MED ORDER — PHENYLEPHRINE 40 MCG/ML (10ML) SYRINGE FOR IV PUSH (FOR BLOOD PRESSURE SUPPORT)
PREFILLED_SYRINGE | INTRAVENOUS | Status: DC | PRN
  Administered 2019-12-15 (×2): 80 ug via INTRAVENOUS
  Administered 2019-12-15: 160 ug via INTRAVENOUS

## 2019-12-15 MED ORDER — ONDANSETRON HCL 4 MG/2ML IJ SOLN
INTRAMUSCULAR | Status: AC
  Filled 2019-12-15: qty 2

## 2019-12-15 MED ORDER — PROPOFOL 10 MG/ML IV BOLUS
INTRAVENOUS | Status: AC
  Filled 2019-12-15: qty 20

## 2019-12-15 MED ORDER — ROCURONIUM BROMIDE 10 MG/ML (PF) SYRINGE
PREFILLED_SYRINGE | INTRAVENOUS | Status: DC | PRN
  Administered 2019-12-15: 20 mg via INTRAVENOUS
  Administered 2019-12-15: 50 mg via INTRAVENOUS

## 2019-12-15 MED ORDER — ORAL CARE MOUTH RINSE: 15.0000 mL | Freq: Once | OROMUCOSAL | Status: AC

## 2019-12-15 MED ORDER — LIDOCAINE 2% (20 MG/ML) 5 ML SYRINGE
INTRAMUSCULAR | Status: AC
  Filled 2019-12-15: qty 5

## 2019-12-15 MED ORDER — CHLORHEXIDINE GLUCONATE 0.12 % MT SOLN
15.0000 mL | Freq: Once | OROMUCOSAL | Status: AC
  Administered 2019-12-15: 15 mL via OROMUCOSAL
  Filled 2019-12-15: qty 15

## 2019-12-15 MED ORDER — GADOBUTROL 1 MMOL/ML IV SOLN
10.0000 mL | Freq: Once | INTRAVENOUS | Status: AC | PRN
  Administered 2019-12-15: 10 mL via INTRAVENOUS

## 2019-12-15 MED ORDER — FENTANYL CITRATE (PF) 250 MCG/5ML IJ SOLN
INTRAMUSCULAR | Status: DC | PRN
Start: 2019-12-15 — End: 2019-12-15
  Administered 2019-12-15: 50 ug via INTRAVENOUS
  Administered 2019-12-15: 25 ug via INTRAVENOUS
  Administered 2019-12-15: 50 ug via INTRAVENOUS
  Administered 2019-12-15 (×3): 25 ug via INTRAVENOUS

## 2019-12-15 MED ORDER — ESMOLOL HCL 100 MG/10ML IV SOLN
INTRAVENOUS | Status: AC
  Filled 2019-12-15: qty 10

## 2019-12-15 NOTE — Transfer of Care (Signed)
Immediate Anesthesia Transfer of Care Note  Patient: Mario [name]  Procedure(s) Performed: MRI LUMBAR SPINE WITH AND WITHOUT CONTRAST; THORASIC SPINE WITHOUT CONTRAST (N/A )  Patient Location: PACU  Anesthesia Type:General  Level of Consciousness: drowsy  Airway & Oxygen Therapy: Patient Spontanous Breathing and Patient connected to face mask oxygen  Post-op Assessment: Report given to RN, Post -op Vital signs reviewed and stable and Patient moving all extremities  Post vital signs: Reviewed and stable  Last Vitals:  Vitals Value Taken Time  BP 136/66 12/15/19 1033  Temp    Pulse 68 12/15/19 1034  Resp 12 12/15/19 1034  SpO2 100 % 12/15/19 1034  Vitals shown include unvalidated device data.  Last Pain:  Vitals:   12/15/19 0714  TempSrc:   PainSc: 7       Patients Stated Pain Goal: 5 (27/74/12 8786)  Complications: No complications documented.

## 2019-12-15 NOTE — Anesthesia Procedure Notes (Signed)
Procedure Name: Intubation Date/Time: 12/15/2019 8:52 AM Performed by: Reece Agar, CRNA Pre-anesthesia Checklist: Patient identified, Emergency Drugs available, Suction available and Patient being monitored Patient Re-evaluated:Patient Re-evaluated prior to induction Oxygen Delivery Method: Circle System Utilized Preoxygenation: Pre-oxygenation with 100% oxygen Induction Type: IV induction Laryngoscope Size: Glidescope and 4 Tube type: Oral Tube size: 7.5 mm Number of attempts: 1 Airway Equipment and Method: Stylet Placement Confirmation: ETT inserted through vocal cords under direct vision,  positive ETCO2 and breath sounds checked- equal and bilateral Secured at: 23 cm Tube secured with: Tape Dental Injury: Teeth and Oropharynx as per pre-operative assessment

## 2019-12-15 NOTE — Anesthesia Postprocedure Evaluation (Signed)
Anesthesia Post Note  Patient: Mario [name]  Procedure(s) Performed: MRI LUMBAR SPINE WITH AND WITHOUT CONTRAST; THORASIC SPINE WITHOUT CONTRAST (N/A )     Patient location during evaluation: PACU Anesthesia Type: General Level of consciousness: awake and alert Pain management: pain level controlled Vital Signs Assessment: post-procedure vital signs reviewed and stable Respiratory status: spontaneous breathing, nonlabored ventilation, respiratory function stable and patient connected to nasal cannula oxygen Cardiovascular status: blood pressure returned to baseline and stable Postop Assessment: no apparent nausea or vomiting Anesthetic complications: no   No complications documented.  Last Vitals:  Vitals:   12/15/19 1100 12/15/19 1103  BP: 137/67   Pulse: 68   Resp: 19   Temp: (!) 36.2 C (!) 36.2 C  SpO2: 98%     Last Pain:  Vitals:   12/15/19 1100  TempSrc:   PainSc: 0-No pain                 Felicha Frayne

## 2019-12-16 ENCOUNTER — Encounter (HOSPITAL_COMMUNITY): Payer: Self-pay | Admitting: Radiology

## 2020-01-05 ENCOUNTER — Encounter: Payer: Self-pay | Admitting: Student in an Organized Health Care Education/Training Program

## 2020-01-05 ENCOUNTER — Ambulatory Visit (HOSPITAL_BASED_OUTPATIENT_CLINIC_OR_DEPARTMENT_OTHER): Payer: Medicare Other | Admitting: Student in an Organized Health Care Education/Training Program

## 2020-01-05 ENCOUNTER — Ambulatory Visit
Admission: RE | Admit: 2020-01-05 | Discharge: 2020-01-05 | Disposition: A | Discharge status: Home / Self Care | Payer: Medicare Other | Source: Ambulatory Visit | Attending: Student in an Organized Health Care Education/Training Program | Admitting: Student in an Organized Health Care Education/Training Program

## 2020-01-05 ENCOUNTER — Other Ambulatory Visit: Payer: Self-pay

## 2020-01-05 VITALS — BP 153/70 | HR 101 | Temp 98.1°F | Resp 18 | Ht 69.0 in | Wt 296.0 lb

## 2020-01-05 DIAGNOSIS — M25511 Pain in right shoulder: Secondary | ICD-10-CM

## 2020-01-05 DIAGNOSIS — G894 Chronic pain syndrome: Secondary | ICD-10-CM

## 2020-01-05 DIAGNOSIS — G8929 Other chronic pain: Secondary | ICD-10-CM | POA: Diagnosis present

## 2020-01-05 DIAGNOSIS — M48062 Spinal stenosis, lumbar region with neurogenic claudication: Secondary | ICD-10-CM | POA: Diagnosis present

## 2020-01-05 DIAGNOSIS — M961 Postlaminectomy syndrome, not elsewhere classified: Secondary | ICD-10-CM | POA: Insufficient documentation

## 2020-01-05 DIAGNOSIS — M19011 Primary osteoarthritis, right shoulder: Secondary | ICD-10-CM | POA: Insufficient documentation

## 2020-01-05 DIAGNOSIS — Z981 Arthrodesis status: Secondary | ICD-10-CM | POA: Diagnosis present

## 2020-01-05 DIAGNOSIS — M5416 Radiculopathy, lumbar region: Secondary | ICD-10-CM

## 2020-01-05 MED ORDER — ROPIVACAINE HCL 2 MG/ML IJ SOLN
4.0000 mL | Freq: Once | INTRAMUSCULAR | Status: AC
  Administered 2020-01-05: 4 mL via INTRA_ARTICULAR
  Filled 2020-01-05: qty 10

## 2020-01-05 MED ORDER — IOHEXOL 180 MG/ML  SOLN
10.0000 mL | Freq: Once | INTRAMUSCULAR | Status: AC
  Administered 2020-01-05: 10 mL via INTRA_ARTICULAR

## 2020-01-05 MED ORDER — LIDOCAINE HCL 2 % IJ SOLN
20.0000 mL | Freq: Once | INTRAMUSCULAR | Status: AC
  Administered 2020-01-05: 400 mg
  Filled 2020-01-05: qty 40

## 2020-01-05 MED ORDER — METHYLPREDNISOLONE ACETATE 40 MG/ML IJ SUSP
40.0000 mg | Freq: Once | INTRAMUSCULAR | Status: AC
  Administered 2020-01-05: 40 mg via INTRA_ARTICULAR
  Filled 2020-01-05: qty 1

## 2020-01-05 NOTE — Progress Notes (Signed)
PROVIDER NOTE: Information contained herein reflects review and annotations entered in association with encounter. Interpretation of such information and data should be left to medically-trained personnel. Information provided to patient can be located elsewhere in the medical record under "Patient Instructions". Document created using STT-dictation technology, any transcriptional errors that may result from process are unintentional.    Patient: Mario [name]  Service Category: Procedure  Provider: Gillis Santa, MD  DOB: [birth year]  DOS: 01/05/2020  Location: [facility]  MRN: [id number]  Setting: Ambulatory - outpatient  Referring Provider: Baxter Hire, MD  Type: Established Patient  Specialty: Interventional Pain Management  PCP: Baxter Hire, MD   Primary Reason for Visit: Interventional Pain Management Treatment. CC: Back Pain  Procedure:          Anesthesia, Analgesia, Anxiolysis:  Type: Diagnostic Glenohumeral Joint (shoulder) Injection #1  Primary Purpose: Diagnostic Region: Anterolateral Shoulder Area Level:  Shoulder Target Area: Glenohumeral Joint (shoulder) Approach: Anterior approach. Laterality: Right-Sided  Type: Local Anesthesia  Local Anesthetic: Lidocaine 1-2%  Position: Supine   Indications: 1. Chronic right shoulder pain   2. Arthropathy of right shoulder   3. Chronic radicular lumbar pain   4. Lumbar radiculopathy   5. Spinal stenosis, lumbar region, with neurogenic claudication   6. Failed back surgical syndrome   7. History of lumbar fusion  (Hx of L5/S1 in 2000)   8. Chronic pain syndrome    Pain Score: Pre-procedure: 8 /10 Post-procedure: 2/10   Pre-op Assessment:  Mario [name] is a 69 y.o. (year old), male patient, seen today for interventional treatment. He  has a past surgical history that includes Back surgery; Replacement total knee (Left); Total hip arthroplasty (Right, 01/26/2018); Colonoscopy with propofol (N/A, 04/13/2018);  Esophagogastroduodenoscopy (egd) with propofol (N/A, 04/13/2018); Joint replacement; Total hip revision (Right, 11/02/2018); Bone graft autograft (1954); and Radiology with anesthesia (N/A, 12/15/2019). Mario [name] has a current medication list which includes the following prescription(s): vitamin c, bayer aspirin ec low dose, camphor-eucalyptus-menthol, dialyvite vitamin d 5000, glimepiride, magnesium, meloxicam, metformin, peppermint oil, pravastatin, tizanidine, trazodone, vitamin b-12, zinc gluconate, diazepam, oxymetazoline hcl, and pantoprazole. His primarily concern today is the Back Pain  Initial Vital Signs:  Pulse/HCG Rate: (!) 101ECG Heart Rate: 65 Temp: 98.1 F (36.7 C) Resp: 18 BP: (!) 154/56 SpO2: 98 %  BMI: Estimated body mass index is 43.71 kg/m as calculated from the following:   Height as of this encounter: 5\' 9"  (1.753 m).   Weight as of this encounter: 296 lb (134.3 kg).  Risk Assessment: Allergies: Reviewed. He is allergic to levaquin [levofloxacin].  Allergy Precautions: None required Coagulopathies: Reviewed. None identified.  Blood-thinner therapy: None at this time Active Infection(s): Reviewed. None identified. Mario [name] is afebrile  Site Confirmation: Mario [name] was asked to confirm the procedure and laterality before marking the site Procedure checklist: Completed Consent: Before the procedure and under the influence of no sedative(s), amnesic(s), or anxiolytics, the patient was informed of the treatment options, risks and possible complications. To fulfill our ethical and legal obligations, as recommended by the American Medical Association's Code of Ethics, I have informed the patient of my clinical impression; the nature and purpose of the treatment or procedure; the risks, benefits, and possible complications of the intervention; the alternatives, including doing nothing; the risk(s) and benefit(s) of the alternative treatment(s) or procedure(s); and the risk(s) and  benefit(s) of doing nothing. The patient was provided information about the general risks and possible complications associated with the procedure.  These may include, but are not limited to: failure to achieve desired goals, infection, bleeding, organ or nerve damage, allergic reactions, paralysis, and death. In addition, the patient was informed of those risks and complications associated to the procedure, such as failure to decrease pain; infection; bleeding; organ or nerve damage with subsequent damage to sensory, motor, and/or autonomic systems, resulting in permanent pain, numbness, and/or weakness of one or several areas of the body; allergic reactions; (i.e.: anaphylactic reaction); and/or death. Furthermore, the patient was informed of those risks and complications associated with the medications. These include, but are not limited to: allergic reactions (i.e.: anaphylactic or anaphylactoid reaction(s)); adrenal axis suppression; blood sugar elevation that in diabetics may result in ketoacidosis or comma; water retention that in patients with history of congestive heart failure may result in shortness of breath, pulmonary edema, and decompensation with resultant heart failure; weight gain; swelling or edema; medication-induced neural toxicity; particulate matter embolism and blood vessel occlusion with resultant organ, and/or nervous system infarction; and/or aseptic necrosis of one or more joints. Finally, the patient was informed that Medicine is not an exact science; therefore, there is also the possibility of unforeseen or unpredictable risks and/or possible complications that may result in a catastrophic outcome. The patient indicated having understood very clearly. We have given the patient no guarantees and we have made no promises. Enough time was given to the patient to ask questions, all of which were answered to the patient's satisfaction. Mario [name] has indicated that he wanted to continue  with the procedure. Attestation: I, the ordering provider, attest that I have discussed with the patient the benefits, risks, side-effects, alternatives, likelihood of achieving goals, and potential problems during recovery for the procedure that I have provided informed consent. Date  Time: 01/05/2020 11:30 AM  Pre-Procedure Preparation:  Monitoring: As per clinic protocol. Respiration, ETCO2, SpO2, BP, heart rate and rhythm monitor placed and checked for adequate function Safety Precautions: Patient was assessed for positional comfort and pressure points before starting the procedure. Time-out: I initiated and conducted the "Time-out" before starting the procedure, as per protocol. The patient was asked to participate by confirming the accuracy of the "Time Out" information. Verification of the correct person, site, and procedure were performed and confirmed by me, the nursing staff, and the patient. "Time-out" conducted as per Joint Commission's Universal Protocol (UP.01.01.01). Time: 1241  Description of Procedure:          Area Prepped: Entire shoulder Area DuraPrep (Iodine Povacrylex [0.7% available iodine] and Isopropyl Alcohol, 74% w/w) Safety Precautions: Aspiration looking for blood return was conducted prior to all injections. At no point did we inject any substances, as a needle was being advanced. No attempts were made at seeking any paresthesias. Safe injection practices and needle disposal techniques used. Medications properly checked for expiration [name]. SDV (single dose vial) medications used. Description of the Procedure: Protocol guidelines were followed. The patient was placed in position over the procedure table. The target area was identified and the area prepped in the usual manner. Skin & deeper tissues infiltrated with local anesthetic. Appropriate amount of time allowed to pass for local anesthetics to take effect. The procedure needles were then advanced to the target area.  Proper needle placement secured. Negative aspiration confirmed. Solution injected in intermittent fashion, asking for systemic symptoms every 0.5cc of injectate. The needles were then removed and the area cleansed, making sure to leave some of the prepping solution back to take advantage of its long term bactericidal  properties.         Vitals:   01/05/20 1139 01/05/20 1241 01/05/20 1254  BP: (!) 154/56 (!) 93/33 (!) 153/70  Pulse: (!) 101    Resp:  18 18  Temp: 98.1 F (36.7 C)    SpO2: 98% 99% 100%  Weight: 296 lb (134.3 kg)    Height: 5\' 9"  (1.753 m)      Start Time: 1241 hrs. End Time: 1249 hrs. Materials:  Needle(s) Type: Spinal Needle Gauge: 22G Length: 3.5-in Medication(s): Please see orders for medications and dosing details. 5 cc solution made of 4 cc of 0.2% ropivacaine, 1 cc of methylprednisolone, 40 mg/cc. Imaging Guidance (Non-Spinal):          Type of Imaging Technique: Fluoroscopy Guidance (Non-Spinal) Indication(s): Assistance in needle guidance and placement for procedures requiring needle placement in or near specific anatomical locations not easily accessible without such assistance. Exposure Time: Please see nurses notes. Contrast: Before injecting any contrast, we confirmed that the patient did not have an allergy to iodine, shellfish, or radiological contrast. Once satisfactory needle placement was completed at the desired level, radiological contrast was injected. Contrast injected under live fluoroscopy. No contrast complications. See chart for type and volume of contrast used. Fluoroscopic Guidance: I was personally present during the use of fluoroscopy. "Tunnel Vision Technique" used to obtain the best possible view of the target area. Parallax error corrected before commencing the procedure. "Direction-depth-direction" technique used to introduce the needle under continuous pulsed fluoroscopy. Once target was reached, antero-posterior, oblique, and lateral  fluoroscopic projection used confirm needle placement in all planes. Images permanently stored in EMR. Interpretation: I personally interpreted the imaging intraoperatively. Adequate needle placement confirmed in multiple planes. Appropriate spread of contrast into desired area was observed. No evidence of afferent or efferent intravascular uptake. Permanent images saved into the patient's record.  Antibiotic Prophylaxis:   Anti-infectives (From admission, onward)   None     Indication(s): None identified  Post-operative Assessment:  Post-procedure Vital Signs:  Pulse/HCG Rate: (!) 10178 Temp: 98.1 F (36.7 C) Resp: 18 BP: (!) 153/70 SpO2: 100 %  EBL: None  Complications: No immediate post-treatment complications observed by team, or reported by patient.  Note: The patient tolerated the entire procedure well. A repeat set of vitals were taken after the procedure and the patient was kept under observation following institutional policy, for this type of procedure. Post-procedural neurological assessment was performed, showing return to baseline, prior to discharge. The patient was provided with post-procedure discharge instructions, including a section on how to identify potential problems. Should any problems arise concerning this procedure, the patient was given instructions to immediately contact us, at any time, without hesitation. In any case, we plan to contact the patient by telephone for a follow-up status report regarding this interventional procedure.  Comments:  No additional relevant information.  Plan of Care    Chronic lumbar radicular pain, failed back surgical syndrome, history of L5-S1 fusion with diminishing pain relief from previous lumbar epidural steroid injections done at L3-L4.  Work-up for spinal cord stimulation was undertaken.  Thoracic and lumbar MRI completed.  Psychiatric assessment completed as well.  I was able to evaluate the patient's interlaminar  windows and his body habitus and his inability to stay stationary for approximately 60 minutes even with sedation will make spinal cord stimulator trial under moderate sedation somewhat challenging.  Even with our attempt today to evaluate the patient's interlaminar windows, he kept having to support himself on his elbows  due to severe low back pain.  I recommend the patient see Dr. Lacinda Axon with neurosurgery to discuss [facility] spinal cord stimulator trial under general anesthesia.  For the patient's right shoulder pain, he was experiencing significant pain relief after his right glenohumeral steroid joint injection today.  Can consider repeating.  Future considerations include right suprascapular nerve block, peripheral nerve stimulation of right axillary nerve and/or suprascapular nerve.  Orders:  Orders Placed This Encounter  Procedures  . SHOULDER INJECTION    Scheduling Instructions:     Side:RIGHT     Sedation: Patient's choice.     Timeframe: Today    Order Specific Question:   Where will this procedure be performed?    Answer:   [facility]    Comments:   Kamela Blansett  . [facility] C-ARM 1-60 MIN NO REPORT    Intraoperative interpretation by procedural physician at [place].    Standing Status:   Standing    Number of Occurrences:   1    Order Specific Question:   Reason for exam:    Answer:   Assistance in needle guidance and placement for procedures requiring needle placement in or near specific anatomical locations not easily accessible without such assistance.  . Ambulatory referral to Neurosurgery    Referral Priority:   Routine    Referral Type:   Surgical    Referral Reason:   Specialty Services Required    Referred to Provider:   Deetta Perla, MD    Requested Specialty:   Neurosurgery    Number of Visits Requested:   1   Medications ordered for procedure: Meds ordered this encounter  Medications  . iohexol (OMNIPAQUE) 180 MG/ML injection 10  mL    Must be Myelogram-compatible. If not available, you may substitute with a water-soluble, non-ionic, hypoallergenic, myelogram-compatible radiological contrast medium.  Marland Kitchen lidocaine (XYLOCAINE) 2 % (with pres) injection 400 mg  . ropivacaine (PF) 2 mg/mL (0.2%) (NAROPIN) injection 4 mL  . methylPREDNISolone acetate (DEPO-MEDROL) injection 40 mg     Follow-up plan:   Return if symptoms worsen or fail to improve.      Status post bilateral SI joint injection on 05/23/2019- not helpful L3/4 and L4/5 spinal stenosis (L5/S1 fusion)-L3-L4 ESI #1 on 06/13/2019, #2 08/03/2019       Recent Visits Date Type Provider Dept  11/24/19 Office Visit Gillis Santa, MD [facility]  Showing recent visits within past 90 days and meeting all other requirements Today's Visits Date Type Provider Dept  01/05/20 Office Visit Gillis Santa, MD [facility]  Showing today's visits and meeting all other requirements Future Appointments No visits were found meeting these conditions. Showing future appointments within next 90 days and meeting all other requirements  Disposition: Discharge home  Discharge (Date  Time): 01/05/2020; 1300 hrs.   Primary Care Physician: Baxter Hire, MD Location: [facility] Note by: Gillis Santa, MD Date: 01/05/2020; Time: 1:17 PM  Disclaimer:  Medicine is not an exact science. The only guarantee in medicine is that nothing is guaranteed. It is important to note that the decision to proceed with this intervention was based on the information collected from the patient. The Data and conclusions were drawn from the patient's questionnaire, the interview, and the physical examination. Because the information was provided in large part by the patient, it cannot be guaranteed that it has not been purposely or unconsciously manipulated. Every effort has been made to obtain as  much relevant data as possible for this evaluation. It is  important to note that the conclusions that lead to this procedure are derived in large part from the available data. Always take into account that the treatment will also be dependent on availability of resources and existing treatment guidelines, considered by other Pain Management Practitioners as being common knowledge and practice, at the time of the intervention. For Medico-Legal purposes, it is also important to point out that variation in procedural techniques and pharmacological choices are the acceptable norm. The indications, contraindications, technique, and results of the above procedure should only be interpreted and judged by a Board-Certified Interventional Pain Specialist with extensive familiarity and expertise in the same exact procedure and technique.

## 2020-01-05 NOTE — Progress Notes (Signed)
Safety precautions to be maintained throughout the outpatient stay will include: orient to surroundings, keep bed in low position, maintain call bell within reach at all times, provide assistance with transfer out of bed and ambulation.  

## 2020-01-05 NOTE — Patient Instructions (Signed)

## 2020-01-25 ENCOUNTER — Other Ambulatory Visit: Payer: Self-pay | Admitting: Neurosurgery

## 2020-02-07 ENCOUNTER — Inpatient Hospital Stay: Admission: RE | Admit: 2020-02-07 | Payer: Medicare Other | Source: Ambulatory Visit

## 2020-02-09 ENCOUNTER — Other Ambulatory Visit: Payer: PRIVATE HEALTH INSURANCE

## 2020-02-13 ENCOUNTER — Ambulatory Visit: Admission: RE | Admit: 2020-02-13 | Payer: Medicare Other | Source: Home / Self Care | Admitting: Neurosurgery

## 2020-02-13 ENCOUNTER — Encounter: Admission: RE | Payer: Self-pay | Source: Home / Self Care

## 2020-02-13 SURGERY — THORACIC LAMINECTOMY FOR SPINAL CORD STIMULATOR
Anesthesia: General

## 2020-02-16 ENCOUNTER — Other Ambulatory Visit: Payer: PRIVATE HEALTH INSURANCE

## 2020-02-20 ENCOUNTER — Ambulatory Visit: Admit: 2020-02-20 | Payer: PRIVATE HEALTH INSURANCE | Admitting: Neurosurgery

## 2020-02-20 SURGERY — UNILATERAL PULSE GENERATOR IMPLANT
Anesthesia: General | Laterality: Right

## 2020-04-13 ENCOUNTER — Other Ambulatory Visit: Payer: Self-pay | Admitting: Otolaryngology

## 2020-04-13 DIAGNOSIS — R42 Dizziness and giddiness: Secondary | ICD-10-CM

## 2020-04-18 ENCOUNTER — Ambulatory Visit: Payer: Medicare Other

## 2020-04-23 ENCOUNTER — Ambulatory Visit
Admission: RE | Admit: 2020-04-23 | Discharge: 2020-04-23 | Disposition: A | Discharge status: Home / Self Care | Payer: Medicare Other | Source: Ambulatory Visit | Attending: Otolaryngology | Admitting: Otolaryngology

## 2020-04-23 ENCOUNTER — Other Ambulatory Visit: Payer: Self-pay

## 2020-04-23 DIAGNOSIS — R42 Dizziness and giddiness: Secondary | ICD-10-CM | POA: Insufficient documentation

## 2020-04-23 IMAGING — US US CAROTID DUPLEX BILAT
1 series · 13 of 24 positions shown · non-contrast
Comparison: None.

CLINICAL DATA: 69-year-old with dizziness.

EXAM:
BILATERAL CAROTID DUPLEX ULTRASOUND
TECHNIQUE: Gray scale imaging, color Doppler and duplex ultrasound were
performed of bilateral carotid and vertebral arteries in the neck.

[Series 1: us carotid bilateral · 13 of 64 slices shown]
[im 1/64]
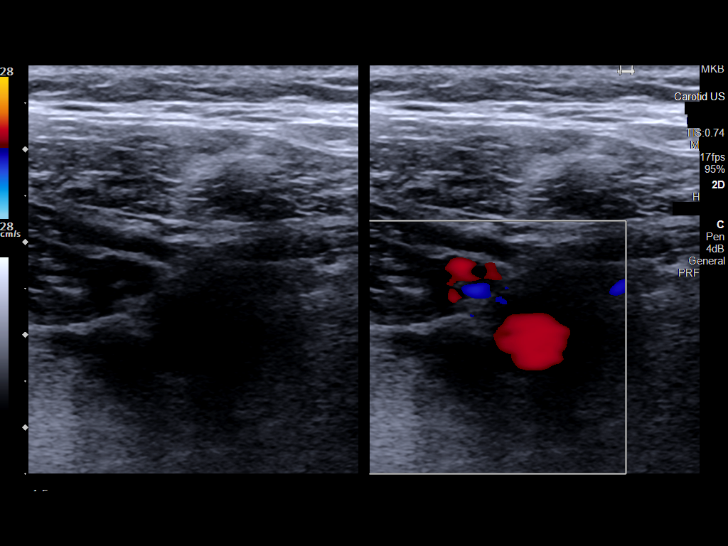
[im 6/64]
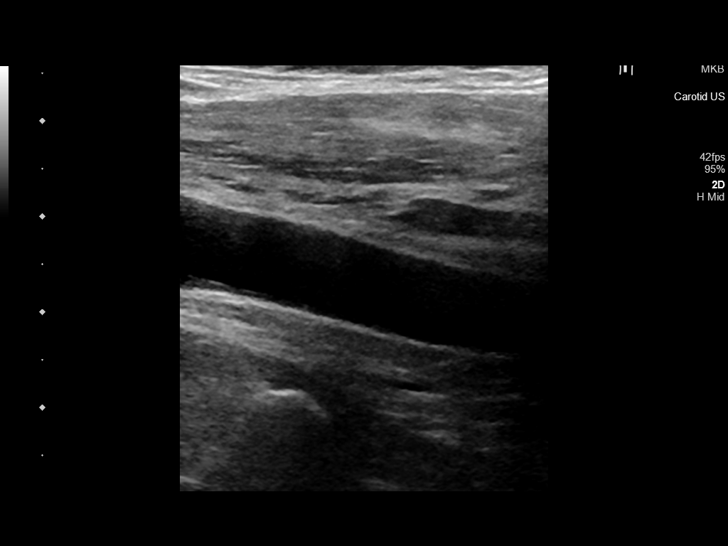
[im 11/64]
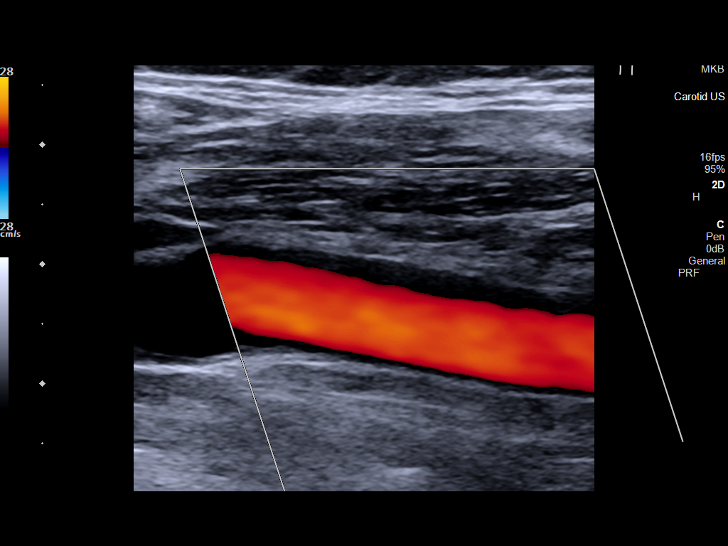
[im 17/64]
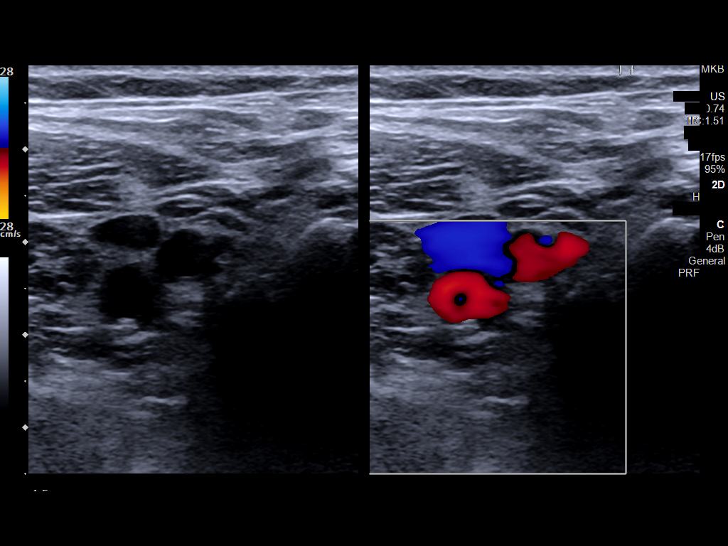
[im 22/64]
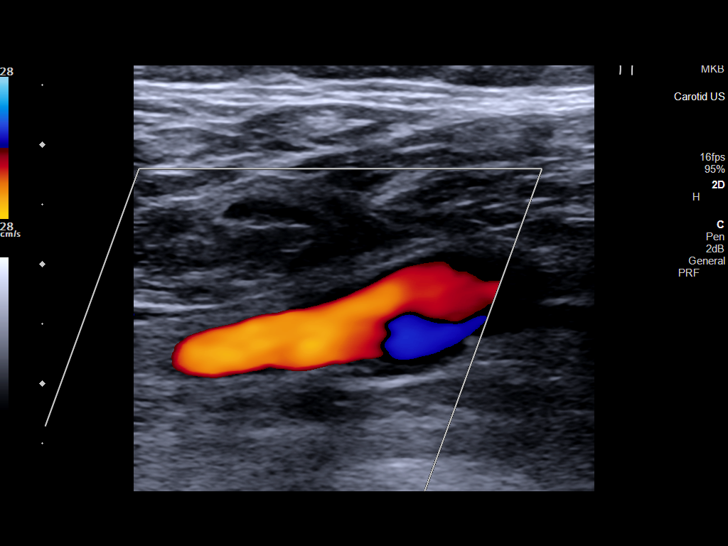
[im 28/64]
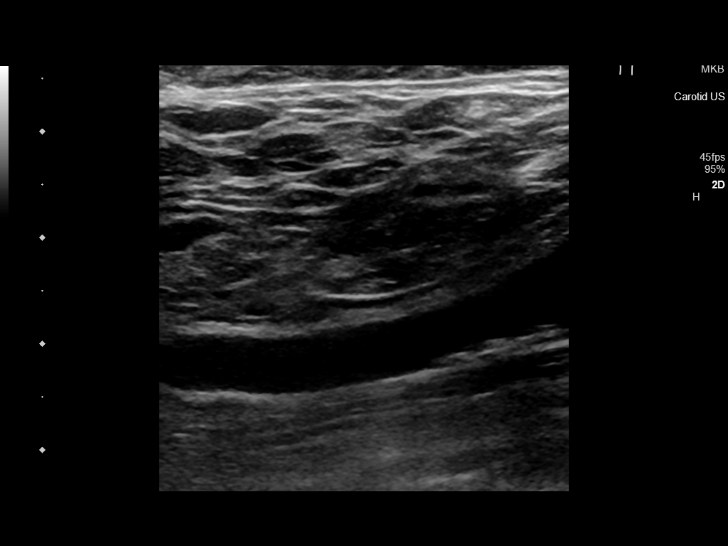
[im 33/64]
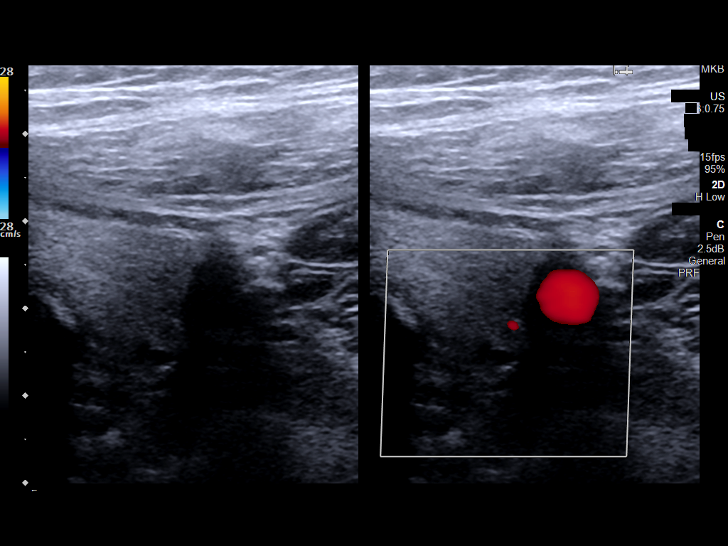
[im 36/64]
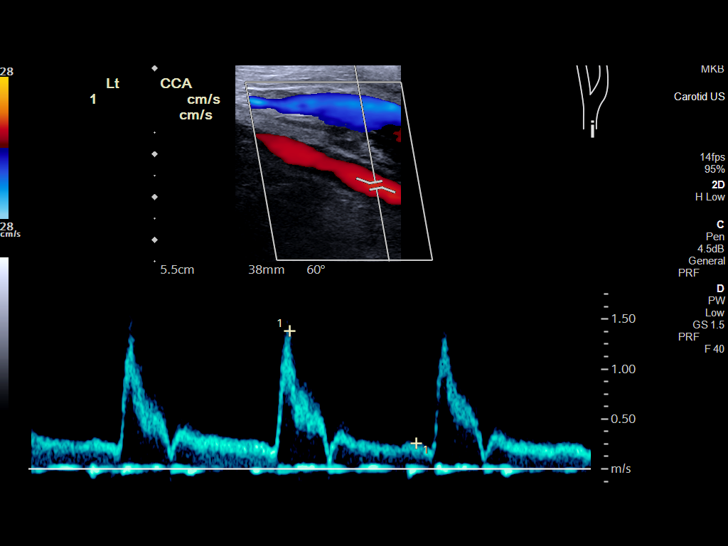
[im 42/64]
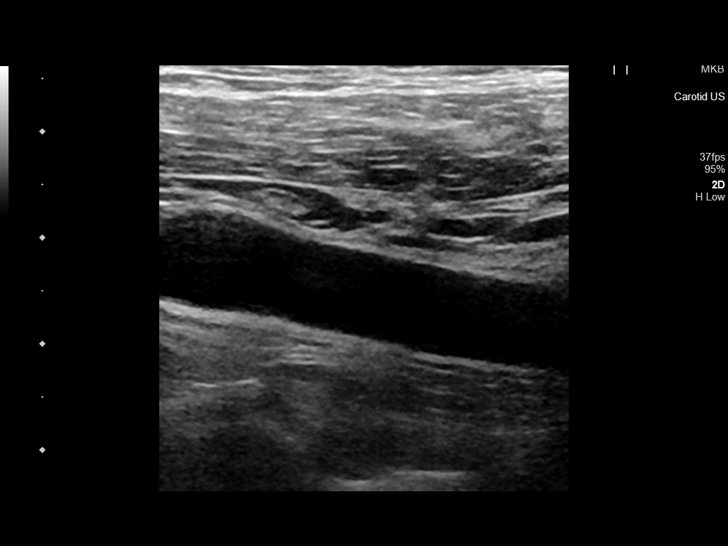
[im 47/64]
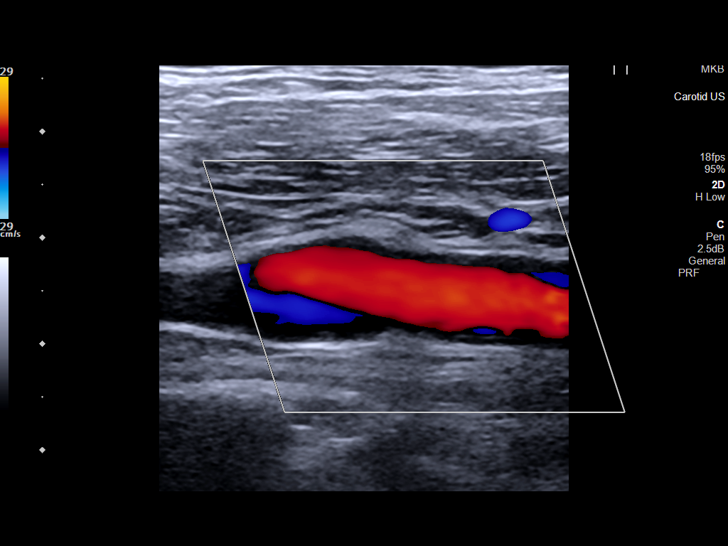
[im 53/64]
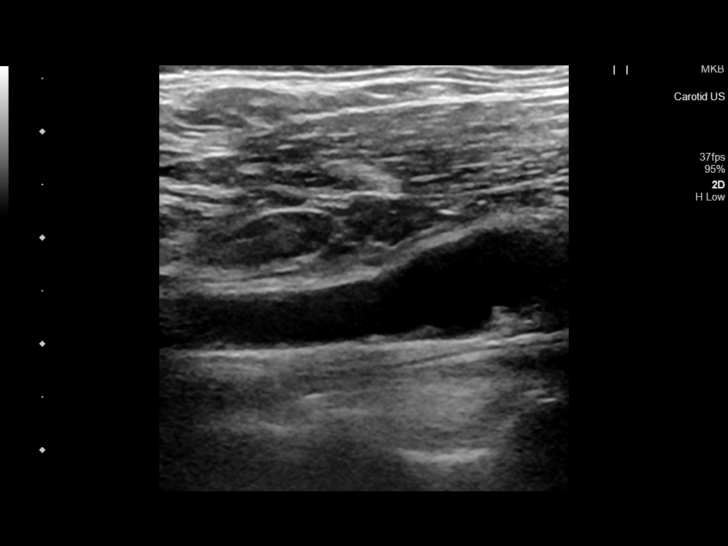
[im 58/64]
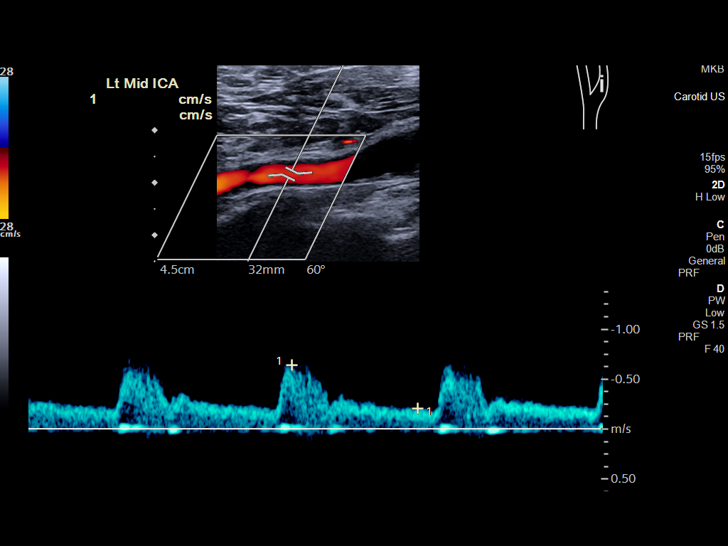
[im 64/64]
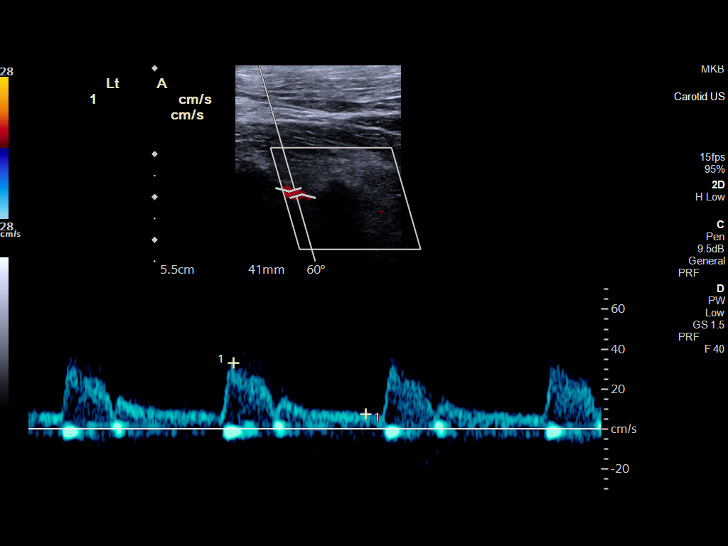

[13 of 24 positions shown; findings below may reference images not displayed]

FINDINGS: Criteria: Quantification of carotid stenosis is based on velocity
parameters that correlate the residual internal carotid diameter
with NASCET-based stenosis levels, using the diameter of the distal
internal carotid lumen as the denominator for stenosis measurement.

The following velocity measurements were obtained:

RIGHT

ICA: 95/26 cm/sec

CCA: 85/18 cm/sec

SYSTOLIC ICA/CCA RATIO:

ECA: 197 cm/sec

LEFT

ICA: 107/36 cm/sec

CCA: 107/21 cm/sec

SYSTOLIC ICA/CCA RATIO:

ECA: 140 cm/sec

RIGHT CAROTID ARTERY: Minimal plaque at the right carotid bulb.
External carotid artery is patent with normal waveform. Minimal
plaque in the proximal internal carotid artery. Normal waveforms and
velocities in the internal carotid artery.

RIGHT VERTEBRAL ARTERY: Antegrade flow and normal waveform in the
right vertebral artery.

LEFT CAROTID ARTERY: External carotid artery is patent with normal
waveform. Normal waveforms and velocities in the internal carotid
artery. Minimal plaque in the internal carotid artery.

LEFT VERTEBRAL ARTERY: Antegrade flow and normal waveform in the
left vertebral artery.
IMPRESSION: 1. Minimal atherosclerotic plaque in the carotid arteries. No
significant carotid artery stenosis. Estimated degree of stenosis in
the internal carotid arteries is less than 50% bilaterally.
2. Patent vertebral arteries with antegrade flow.

## 2020-04-24 ENCOUNTER — Other Ambulatory Visit: Payer: Self-pay | Admitting: Otolaryngology

## 2020-04-24 DIAGNOSIS — R42 Dizziness and giddiness: Secondary | ICD-10-CM

## 2020-05-04 ENCOUNTER — Ambulatory Visit: Payer: Medicare Other

## 2020-09-12 ENCOUNTER — Other Ambulatory Visit: Payer: Self-pay | Admitting: Otolaryngology

## 2020-09-12 DIAGNOSIS — R42 Dizziness and giddiness: Secondary | ICD-10-CM

## 2020-09-20 ENCOUNTER — Other Ambulatory Visit: Payer: Self-pay | Admitting: Otolaryngology

## 2020-09-20 ENCOUNTER — Ambulatory Visit
Admission: RE | Admit: 2020-09-20 | Discharge: 2020-09-20 | Disposition: A | Discharge status: Home / Self Care | Payer: Medicare Other | Source: Ambulatory Visit | Attending: Otolaryngology | Admitting: Otolaryngology

## 2020-09-20 DIAGNOSIS — R42 Dizziness and giddiness: Secondary | ICD-10-CM

## 2020-09-20 IMAGING — MR MR HEAD W/O CM
10 series · 48 of 48 positions shown · non-contrast
Comparison: None.

CLINICAL DATA: Vertigo

EXAM:
MRI HEAD WITHOUT CONTRAST
TECHNIQUE: Multiplanar, multiecho pulse sequences of the brain and surrounding
structures were obtained without intravenous contrast. Patient
declined contrast.

[Series 2: T1 · sagittal · 5.0mm · 0.45mm/px · 3 of 23 slices shown (1 of 2)]
[im 1/23]
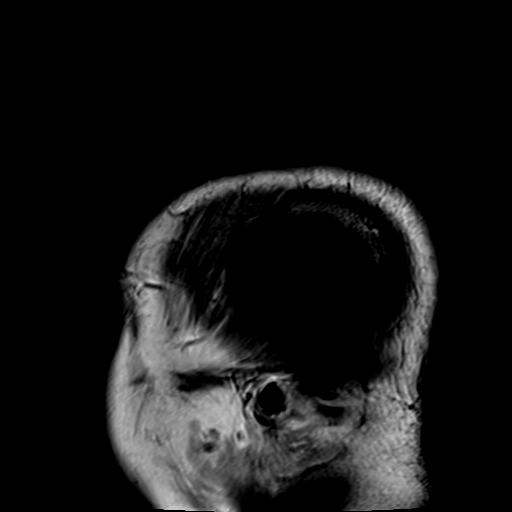
[im 12/23]
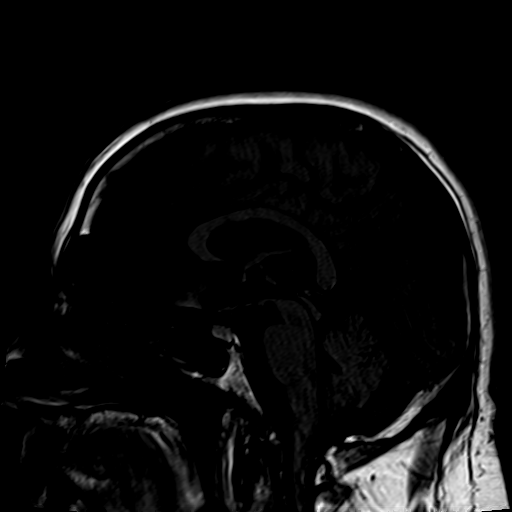
[im 23/23]
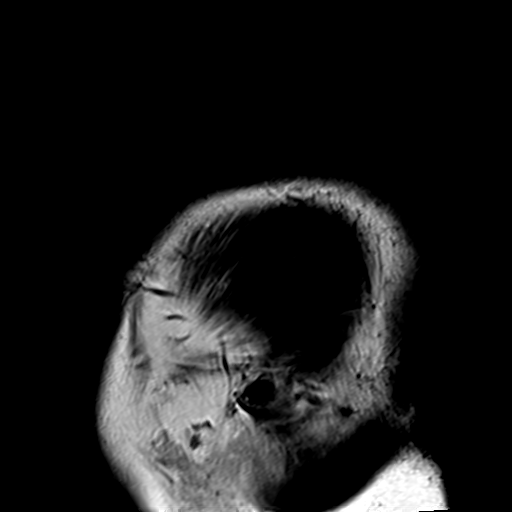

[Series 3: ax ep2d_diff_3 · axial · 3.0mm · 1.88mm/px · z∈[-42,+104]mm · 8 of 98 slices shown]
[im 1/98]
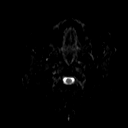
[im 14/98]
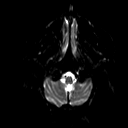
[im 28/98]
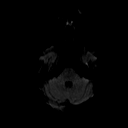
[im 42/98]
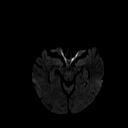
[im 56/98]
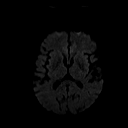
[im 70/98]
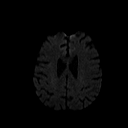
[im 84/98]
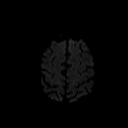
[im 98/98]
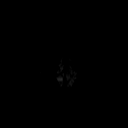

[Series 4: ax ep2d_diff_3_adc · axial · 3.0mm · 1.88mm/px · z∈[-42,+104]mm · 4 of 50 slices shown]
[im 1/50]
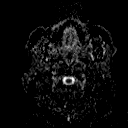
[im 17/50]
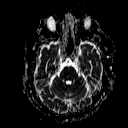
[im 33/50]
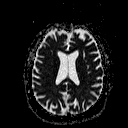
[im 50/50]
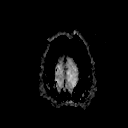

[Series 5: cor ep2d_diffusion · coronal · 5.0mm · 1.77mm/px · 5 of 56 slices shown]
[im 1/56]
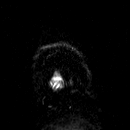
[im 14/56]
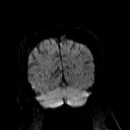
[im 28/56]
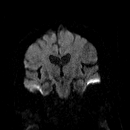
[im 42/56]
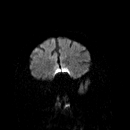
[im 56/56]
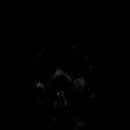

[Series 6: cor ep2d_diffusion_adc · coronal · 5.0mm · 1.77mm/px · 2 of 28 slices shown]
[im 1/28]
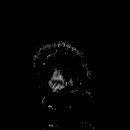
[im 28/28]
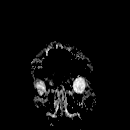

[Series 7: FLAIR · axial · 3.0mm · 0.43mm/px · z∈[-42,+103]mm · 3 of 38 slices shown]
[im 1/38]
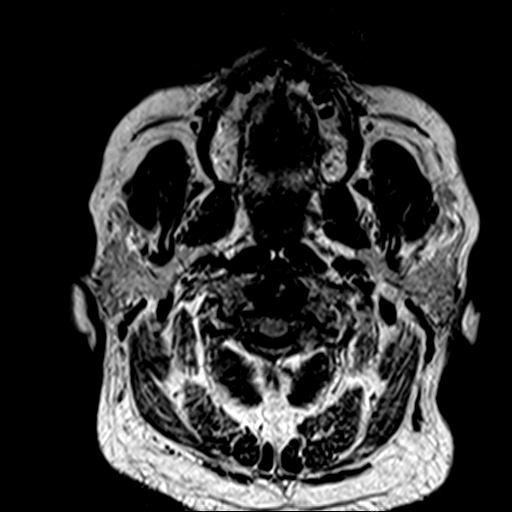
[im 19/38]
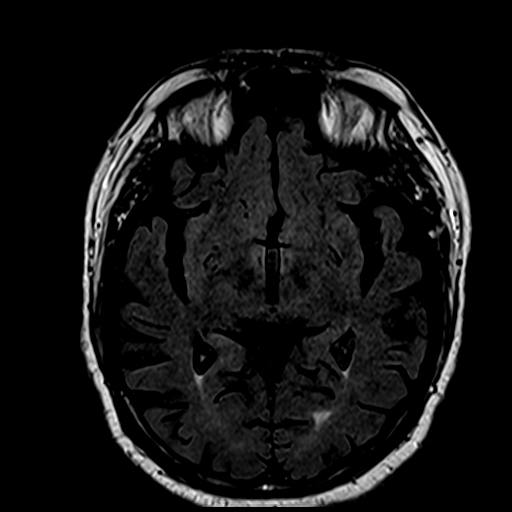
[im 38/38]
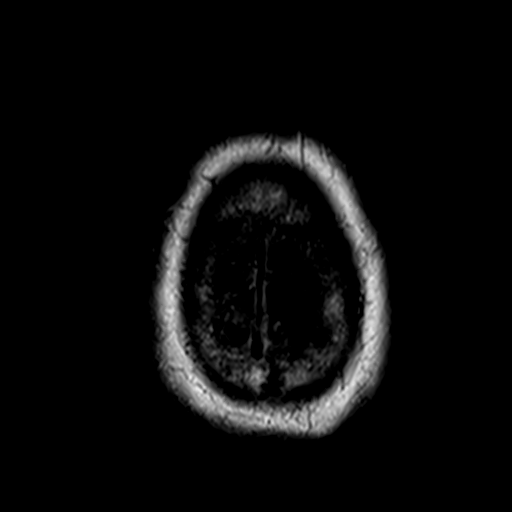

[Series 8: T2 · axial · 5.0mm · 0.60mm/px · z∈[-43,+106]mm · 2 of 26 slices shown (1 of 2)]
[im 1/26]
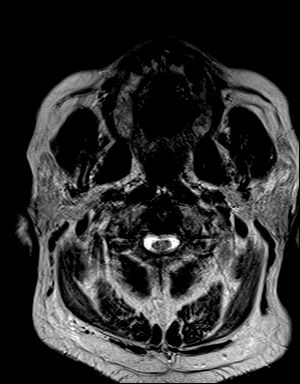
[im 26/26]
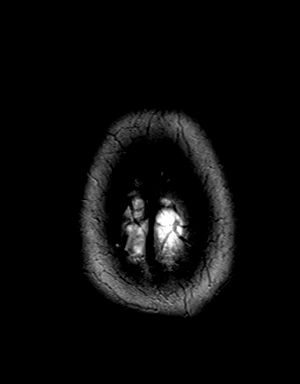

[Series 10: swi_images · axial · 2.0mm · 0.90mm/px · z∈[-47,+110]mm · 7 of 80 slices shown]
[im 1/80]
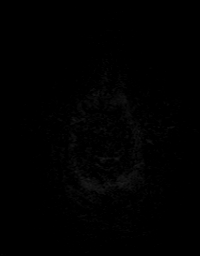
[im 14/80]
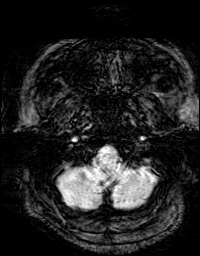
[im 27/80]
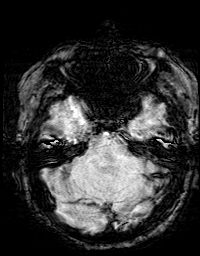
[im 40/80]
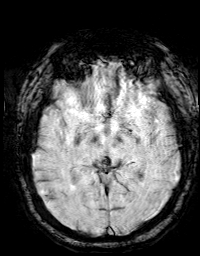
[im 53/80]
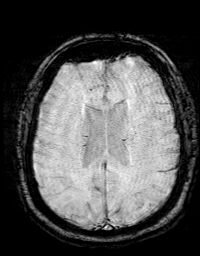
[im 66/80]
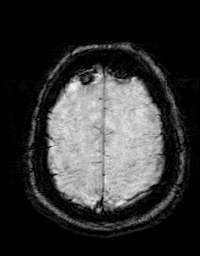
[im 80/80]
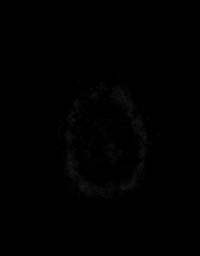

[Series 11: T1 · axial · 1.0mm · 0.72mm/px · z∈[-39,+103]mm · 12 of 144 slices shown (2 of 2)]
[im 1/144]
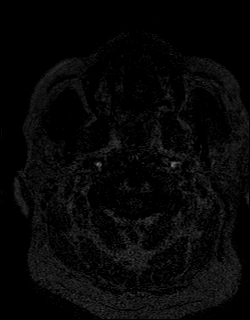
[im 14/144]
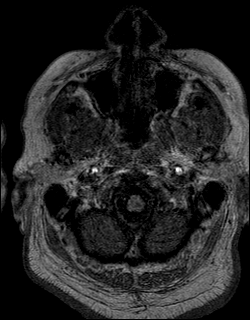
[im 27/144]
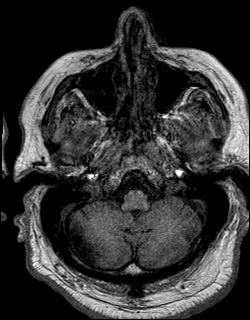
[im 40/144]
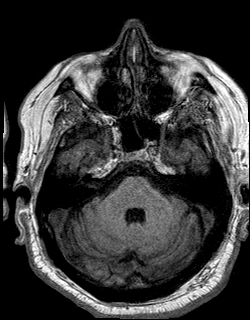
[im 53/144]
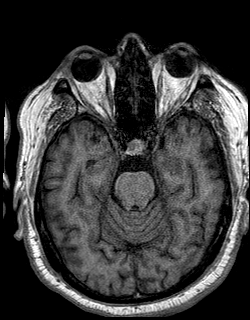
[im 66/144]
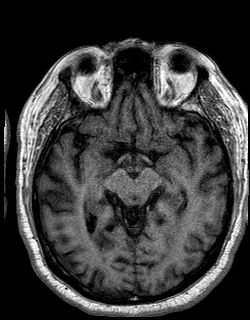
[im 79/144]
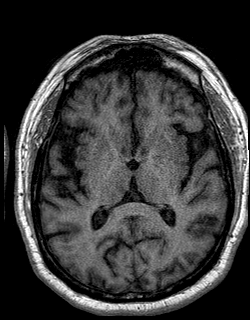
[im 92/144]
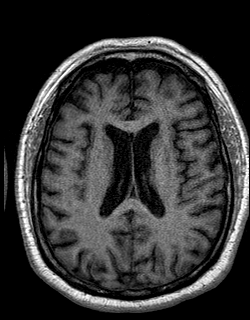
[im 105/144]
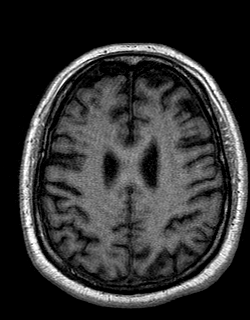
[im 118/144]
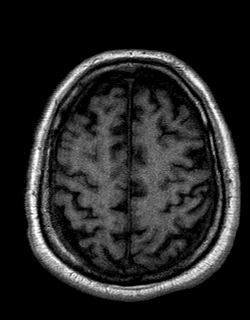
[im 131/144]
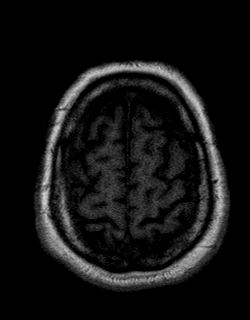
[im 144/144]
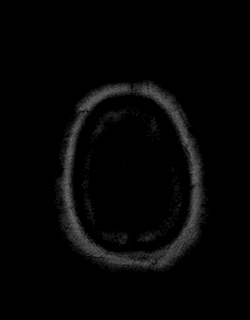

[Series 12: T2 · coronal · 5.0mm · 0.45mm/px · 2 of 28 slices shown (2 of 2)]
[im 1/28]
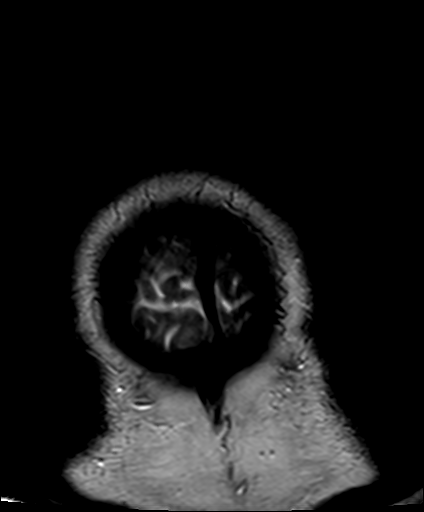
[im 28/28]
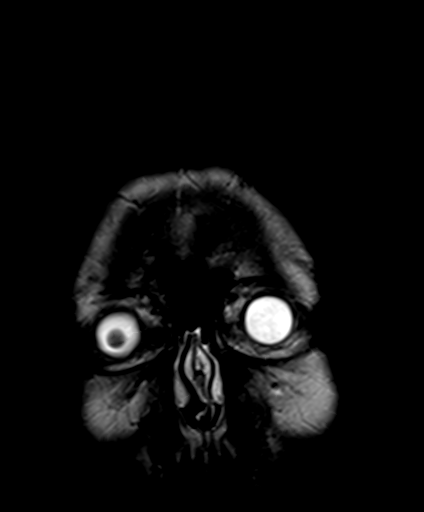

[48 of 48 positions shown; findings below may reference images not displayed]

FINDINGS: Motion artifact is present.

Brain: There is no acute infarction or intracranial hemorrhage.
There is no intracranial mass, mass effect, or edema. There is no
hydrocephalus or extra-axial fluid collection. Patchy T2
hyperintensity in the supratentorial white matter is nonspecific but
may reflect mild chronic microvascular ischemic changes. Prominence
of the ventricles and sulci reflects generalized parenchymal volume
loss.

Vascular: Major vessel flow voids at the skull base are preserved.

Skull and upper cervical spine: Normal marrow signal is preserved.

Sinuses/Orbits: Paranasal sinuses are aerated. Orbits are
unremarkable.

Other: Sella is unremarkable.  Mastoid air cells are clear.
IMPRESSION: No evidence of recent infarction, hemorrhage, or mass. Mild chronic
microvascular ischemic changes.

## 2020-09-23 ENCOUNTER — Other Ambulatory Visit: Payer: PRIVATE HEALTH INSURANCE

## 2020-12-13 ENCOUNTER — Other Ambulatory Visit: Payer: Self-pay | Admitting: Orthopaedic Surgery

## 2020-12-13 DIAGNOSIS — M67813 Other specified disorders of tendon, right shoulder: Secondary | ICD-10-CM

## 2021-01-06 ENCOUNTER — Other Ambulatory Visit: Payer: PRIVATE HEALTH INSURANCE

## 2021-01-19 ENCOUNTER — Ambulatory Visit
Admission: RE | Admit: 2021-01-19 | Discharge: 2021-01-19 | Disposition: A | Discharge status: Home / Self Care | Payer: Medicare Other | Source: Ambulatory Visit | Attending: Orthopaedic Surgery | Admitting: Orthopaedic Surgery

## 2021-01-19 ENCOUNTER — Other Ambulatory Visit: Payer: Self-pay

## 2021-01-19 DIAGNOSIS — M67813 Other specified disorders of tendon, right shoulder: Secondary | ICD-10-CM

## 2021-02-14 ENCOUNTER — Ambulatory Visit: Payer: Medicare Other | Admitting: Dermatology

## 2021-03-20 ENCOUNTER — Ambulatory Visit (INDEPENDENT_AMBULATORY_CARE_PROVIDER_SITE_OTHER): Payer: Medicare Other | Admitting: Dermatology

## 2021-03-20 ENCOUNTER — Other Ambulatory Visit: Payer: Self-pay

## 2021-03-20 DIAGNOSIS — L578 Other skin changes due to chronic exposure to nonionizing radiation: Secondary | ICD-10-CM | POA: Diagnosis not present

## 2021-03-20 DIAGNOSIS — D1800 Hemangioma unspecified site: Secondary | ICD-10-CM

## 2021-03-20 DIAGNOSIS — Z1283 Encounter for screening for malignant neoplasm of skin: Secondary | ICD-10-CM | POA: Diagnosis not present

## 2021-03-20 DIAGNOSIS — L820 Inflamed seborrheic keratosis: Secondary | ICD-10-CM

## 2021-03-20 DIAGNOSIS — L720 Epidermal cyst: Secondary | ICD-10-CM

## 2021-03-20 DIAGNOSIS — D229 Melanocytic nevi, unspecified: Secondary | ICD-10-CM

## 2021-03-20 DIAGNOSIS — L821 Other seborrheic keratosis: Secondary | ICD-10-CM

## 2021-03-20 DIAGNOSIS — D485 Neoplasm of uncertain behavior of skin: Secondary | ICD-10-CM | POA: Diagnosis not present

## 2021-03-20 DIAGNOSIS — L814 Other melanin hyperpigmentation: Secondary | ICD-10-CM

## 2021-03-20 DIAGNOSIS — L719 Rosacea, unspecified: Secondary | ICD-10-CM | POA: Diagnosis not present

## 2021-03-20 NOTE — Patient Instructions (Signed)
Wound Care Instructions  Cleanse wound gently with soap and water once a day then pat dry with clean gauze. Apply a thing coat of Petrolatum (petroleum jelly, "Vaseline") over the wound (unless you have an allergy to this). We recommend that you use a new, sterile tube of Vaseline. Do not pick or remove scabs. Do not remove the yellow or white "healing tissue" from the base of the wound.  Cover the wound with fresh, clean, nonstick gauze and secure with paper tape. You may use Band-Aids in place of gauze and tape if the would is small enough, but would recommend trimming much of the tape off as there is often too much. Sometimes Band-Aids can irritate the skin.  You should call the office for your biopsy report after 1 week if you have not already been contacted.  If you experience any problems, such as abnormal amounts of bleeding, swelling, significant bruising, significant pain, or evidence of infection, please call the office immediately.  FOR ADULT SURGERY PATIENTS: If you need something for pain relief you may take 1 extra strength Tylenol (acetaminophen) AND 2 Ibuprofen (200mg  each) together every 4 hours as needed for pain. (do not take these if you are allergic to them or if you have a reason you should not take them.) Typically, you may only need pain medication for 1 to 3 days.     Cryotherapy Aftercare  Wash gently with soap and water everyday.   Apply Vaseline and Band-Aid daily until healed.     If You Need Anything After Your Visit  If you have any questions or concerns for your doctor, please call our main line at [phone] and press option 4 to reach your doctor's medical assistant. If no one answers, please leave a voicemail as directed and we will return your call as soon as possible. Messages left after 4 pm will be answered the following business day.   You may also send [name] a message via [place]. We typically respond to MyChart messages within 1-2 business  days.  For prescription refills, please ask your pharmacy to contact our office. Our fax number is [phone].  If you have an urgent issue when the clinic is closed that cannot wait until the next business day, you can page your doctor at the number below.    Please note that while we do our best to be available for urgent issues outside of office hours, we are not available 24/7.   If you have an urgent issue and are unable to reach [name], you may choose to seek medical care at your doctor's office, retail clinic, urgent care center, or emergency room.  If you have a medical emergency, please immediately call 911 or go to the emergency department.  Pager Numbers  - Dr. Nehemiah Massed: [phone]  - Dr. Laurence Ferrari: [phone]  - Dr. Nicole Kindred: [phone]  In the event of inclement weather, please call our main line at [phone] for an update on the status of any delays or closures.  Dermatology Medication Tips: Please keep the boxes that topical medications come in in order to help keep track of the instructions about where and how to use these. Pharmacies typically print the medication instructions only on the boxes and not directly on the medication tubes.   If your medication is too expensive, please contact our office at [phone] option 4 or send [name] a message through [place].   We are unable to tell what your co-pay for medications will be in advance  as this is different depending on your insurance coverage. However, we may be able to find a substitute medication at lower cost or fill out paperwork to get insurance to cover a needed medication.   If a prior authorization is required to get your medication covered by your insurance company, please allow [name] 1-2 business days to complete this process.  Drug prices often vary depending on where the prescription is filled and some pharmacies may offer cheaper prices.  The website www.goodrx.com contains coupons for medications through  different pharmacies. The prices here do not account for what the cost may be with help from insurance (it may be cheaper with your insurance), but the website can give you the price if you did not use any insurance.  - You can print the associated coupon and take it with your prescription to the pharmacy.  - You may also stop by our office during regular business hours and pick up a GoodRx coupon card.  - If you need your prescription sent electronically to a different pharmacy, notify our office through [facility] or by phone at [phone] option 4.     Si Usted Necesita Algo Despus de Su Visita  Tambin puede enviarnos un mensaje a travs de Pharmacist, community. Por lo general respondemos a los mensajes de MyChart en el transcurso de 1 a 2 das hbiles.  Para renovar recetas, por favor pida a su farmacia que se ponga en contacto con nuestra oficina. Harland Dingwall de fax es [place] [phone].  Si tiene un asunto urgente cuando la clnica est cerrada y que no puede esperar hasta el siguiente da hbil, puede llamar/localizar a su doctor(a) al nmero que aparece a continuacin.   Por favor, tenga en cuenta que aunque hacemos todo lo posible para estar disponibles para asuntos urgentes fuera del horario de [place], no estamos disponibles las 24 horas del da, los 7 das de la [place].   Si tiene un problema urgente y no puede comunicarse con nosotros, puede optar por buscar atencin mdica  en el consultorio de su doctor(a), en una clnica privada, en un centro de atencin urgente o en una sala de emergencias.  Si tiene Engineering geologist, por favor llame inmediatamente al 911 o vaya a la sala de emergencias.  Nmeros de bper  - Dr. Nehemiah Massed: [phone]  - Dra. Moye: [phone]  - Dra. Nicole Kindred: [phone]  En caso de inclemencias del [place], por favor llame a Johnsie Kindred principal al [phone] para una actualizacin sobre el [place] de cualquier retraso o cierre.  Consejos  para la medicacin en dermatologa: Por favor, guarde las cajas en las que vienen los medicamentos de uso tpico para ayudarle a seguir las instrucciones sobre dnde y cmo usarlos. Las farmacias generalmente imprimen las instrucciones del medicamento slo en las cajas y no directamente en los tubos del [place].   Si su medicamento es muy caro, por favor, pngase en contacto con Zigmund Daniel llamando al [phone] y presione la opcin 4 o envenos un mensaje a travs de Pharmacist, community.   No podemos decirle cul ser su copago por los medicamentos por adelantado ya que esto es diferente dependiendo de la cobertura de su seguro. Sin embargo, es posible que podamos encontrar un medicamento sustituto a Electrical engineer un formulario para que el seguro cubra el medicamento que se considera necesario.   Si se requiere una autorizacin previa para que su compaa de seguros [place] su medicamento, por favor permtanos de 1 a 2 das  hbiles para completar este proceso.  Los precios de los medicamentos varan con frecuencia dependiendo del Environmental consultant de dnde se surte la receta y alguna farmacias pueden ofrecer precios ms baratos.  El sitio web www.goodrx.com tiene cupones para medicamentos de Airline pilot. Los precios aqu no tienen en cuenta lo que podra costar con la ayuda del seguro (puede ser ms barato con su seguro), pero el sitio web puede darle el precio si no utiliz Research scientist (physical sciences).  - Puede imprimir el cupn correspondiente y llevarlo con su receta a la farmacia.  - Tambin puede pasar por nuestra oficina durante el horario de atencin regular y Charity fundraiser una tarjeta de cupones de GoodRx.  - Si necesita que su receta se enve electrnicamente a una farmacia diferente, informe a nuestra oficina a travs de MyChart de [place] o por telfono llamando al [phone] y presione la opcin 4.

## 2021-03-20 NOTE — Progress Notes (Signed)
New Patient Visit  Subjective  Mario [name] is a 71 y.o. male who presents for the following: Other (New patient - spots of abdomen, face and post neck that are irritating). The patient presents for Upper Body Skin Exam (UBSE) for skin cancer screening and mole check.  The patient has spots, moles and lesions to be evaluated, some may be new or changing and the patient has concerns that these could be cancer.  Referred by Dr. Harrel Lemon  The following portions of the chart were reviewed this encounter and updated as appropriate:   Tobacco   Allergies   Meds   Problems   Med Hx   Surg Hx   Fam Hx      Review of Systems:  No other skin or systemic complaints except as noted in HPI or Assessment and Plan.  Objective  Well appearing patient in no apparent distress; mood and affect are within normal limits.  All skin waist up examined.  Left xyphoid 0.6 cm red papule  Right cheek x 1, left cheek x 1 (2) Erythematous stuck-on, waxy papule or plaque  Post base of neck 1.0 cm cystic papule  Head - Anterior (Face) Erythema   Assessment & Plan   Lentigines - Scattered tan macules - Due to sun exposure - Benign-appearing, observe - Recommend daily broad spectrum sunscreen SPF 30+ to sun-exposed areas, reapply every 2 hours as needed. - Call for any changes  Seborrheic Keratoses - Stuck-on, waxy, tan-brown papules and/or plaques  - Benign-appearing - Discussed benign etiology and prognosis. - Observe - Call for any changes  Melanocytic Nevi - Tan-brown and/or pink-flesh-colored symmetric macules and papules - Benign appearing on exam today - Observation - Call clinic for new or changing moles - Recommend daily use of broad spectrum spf 30+ sunscreen to sun-exposed areas.   Hemangiomas - Red papules - Discussed benign nature - Observe - Call for any changes  Actinic Damage - Chronic condition, secondary to cumulative UV/sun exposure - diffuse scaly erythematous  macules with underlying dyspigmentation - Recommend daily broad spectrum sunscreen SPF 30+ to sun-exposed areas, reapply every 2 hours as needed.  - Staying in the shade or wearing long sleeves, sun glasses (UVA+UVB protection) and wide brim hats (4-inch brim around the entire circumference of the hat) are also recommended for sun protection.  - Call for new or changing lesions.  Skin cancer screening performed today.  Neoplasm of uncertain behavior of skin Left xyphoid  Epidermal / dermal shaving  Lesion diameter (cm):  0.6 Informed consent: discussed and consent obtained   Timeout: patient name, date of birth, surgical site, and procedure verified   Procedure prep:  Patient was prepped and draped in usual sterile fashion Prep type:  Isopropyl alcohol Anesthesia: the lesion was anesthetized in a standard fashion   Anesthetic:  1% lidocaine w/ epinephrine 1-100,000 buffered w/ 8.4% NaHCO3 Instrument used: flexible razor blade   Hemostasis achieved with: pressure, aluminum chloride and electrodesiccation   Outcome: patient tolerated procedure well   Post-procedure details: sterile dressing applied and wound care instructions given   Dressing type: bandage and petrolatum    Specimen 1 - Surgical pathology Differential Diagnosis: Hemangioma vs other Check Margins: No  Inflamed seborrheic keratosis (2) Right cheek x 1, left cheek x 1  Destruction of lesion - Right cheek x 1, left cheek x 1 Complexity: simple   Destruction method: cryotherapy   Informed consent: discussed and consent obtained   Timeout:  patient name, date  of birth, surgical site, and procedure verified Lesion destroyed using liquid nitrogen: Yes   Region frozen until ice ball extended beyond lesion: Yes   Outcome: patient tolerated procedure well with no complications   Post-procedure details: wound care instructions given    Epidermal inclusion cyst Post base of neck  Benign-appearing. Exam most consistent  with an epidermal inclusion cyst. Discussed that a cyst is a benign growth that can grow over time and sometimes get irritated or inflamed. Recommend observation if it is not bothersome. Discussed option of surgical excision to remove it if it is growing, symptomatic, or other changes noted. Please call for new or changing lesions so they can be evaluated.  Discussed excision. Patient declines today.  Rosacea Head - Anterior (Face)  Rosacea is a chronic progressive skin condition usually affecting the face of adults, causing redness and/or acne bumps. It is treatable but not curable. It sometimes affects the eyes (ocular rosacea) as well. It may respond to topical and/or systemic medication and can flare with stress, sun exposure, alcohol, exercise and some foods.  Daily application of broad spectrum spf 30+ sunscreen to face is recommended to reduce flares.  Discussed the treatment option of BBL/laser.  Typically we recommend 1-3 treatment sessions about 5-8 weeks apart for best results.  The patient's condition may require "maintenance treatments" in the future.  The fee for BBL / laser treatments is $350 per treatment session for the whole face.  A fee can be quoted for other parts of the body. Insurance typically does not pay for BBL/laser treatments and therefore the fee is an out-of-pocket cost. Patient declines today.  Return in about 3 months (around 06/18/2021) for ISK follow up.  I, Ashok Cordia, CMA, am acting as scribe for Sarina Ser, MD . Documentation: I have reviewed the above documentation for accuracy and completeness, and I agree with the above.  Sarina Ser, MD

## 2021-03-22 ENCOUNTER — Encounter: Payer: Self-pay | Admitting: Dermatology

## 2021-03-26 ENCOUNTER — Telehealth: Payer: Self-pay

## 2021-03-26 NOTE — Telephone Encounter (Signed)
Patient informed of pathology results 

## 2021-04-23 ENCOUNTER — Other Ambulatory Visit: Payer: Self-pay | Admitting: Physical Medicine & Rehabilitation

## 2021-04-23 DIAGNOSIS — M5441 Lumbago with sciatica, right side: Secondary | ICD-10-CM

## 2021-04-23 DIAGNOSIS — G8929 Other chronic pain: Secondary | ICD-10-CM

## 2021-05-03 ENCOUNTER — Ambulatory Visit: Payer: PRIVATE HEALTH INSURANCE

## 2021-06-19 ENCOUNTER — Ambulatory Visit: Payer: PRIVATE HEALTH INSURANCE | Admitting: Dermatology

## 2021-07-08 ENCOUNTER — Encounter: Payer: Self-pay | Admitting: Dermatology

## 2021-07-08 ENCOUNTER — Ambulatory Visit (INDEPENDENT_AMBULATORY_CARE_PROVIDER_SITE_OTHER): Payer: Medicare Other | Admitting: Dermatology

## 2021-07-08 DIAGNOSIS — L720 Epidermal cyst: Secondary | ICD-10-CM

## 2021-07-08 DIAGNOSIS — L821 Other seborrheic keratosis: Secondary | ICD-10-CM

## 2021-07-08 DIAGNOSIS — L578 Other skin changes due to chronic exposure to nonionizing radiation: Secondary | ICD-10-CM

## 2021-07-08 NOTE — Patient Instructions (Addendum)
Seborrheic Keratosis ? ?What causes seborrheic keratoses? ?Seborrheic keratoses are harmless, common skin growths that first appear during adult life.  As time goes by, more growths appear.  Some people may develop a large number of them.  Seborrheic keratoses appear on both covered and uncovered body parts.  They are not caused by sunlight.  The tendency to develop seborrheic keratoses can be inherited.  They vary in color from skin-colored to gray, brown, or even black.  They can be either smooth or have a rough, warty surface.   ?Seborrheic keratoses are superficial and look as if they were stuck on the skin.  Under the microscope this type of keratosis looks like layers upon layers of skin.  That is why at times the top layer may seem to fall off, but the rest of the growth remains and re-grows.   ? ?Treatment ?Seborrheic keratoses do not need to be treated, but can easily be removed in the office.  Seborrheic keratoses often cause symptoms when they rub on clothing or jewelry.  Lesions can be in the way of shaving.  If they become inflamed, they can cause itching, soreness, or burning.  Removal of a seborrheic keratosis can be accomplished by freezing, burning, or surgery. ?If any spot bleeds, scabs, or grows rapidly, please return to have it checked, as these can be an indication of a skin cancer. ? ?Recommend daily broad spectrum sunscreen SPF 30+ to sun-exposed areas, reapply every 2 hours as needed. Call for new or changing lesions.  ?Staying in the shade or wearing long sleeves, sun glasses (UVA+UVB protection) and wide brim hats (4-inch brim around the entire circumference of the hat) are also recommended for sun protection.  ? ? ? ?Pre-Operative Instructions ? ?You are scheduled for a surgical procedure at [facility]. We recommend you read the following instructions. If you have any questions or concerns, please call the office at [phone]. ? ?Shower and wash the entire body with soap and  water the day of your surgery paying special attention to cleansing at and around the planned surgery site. ? ?Avoid aspirin or aspirin containing products at least fourteen (14) days prior to your surgical procedure and for at least one week (7 Days) after your surgical procedure. If you take aspirin on a regular basis for heart disease or history of stroke or for any other reason, we may recommend you continue taking aspirin but please notify us if you take this on a regular basis. Aspirin can cause more bleeding to occur during surgery as well as prolonged bleeding and bruising after surgery.  ? ?Avoid other nonsteroidal pain medications at least one week prior to surgery and at least one week prior to your surgery. These include medications such as Ibuprofen (Motrin, Advil and Nuprin), Naprosyn, Voltaren, Relafen, etc. If medications are used for therapeutic reasons, please inform us as they can cause increased bleeding or prolonged bleeding during and bruising after surgical procedures.  ? ?Please advise [name] if you are taking any "blood thinner" medications such as Coumadin or Dipyridamole or Plavix or similar medications. These cause increased bleeding and prolonged bleeding during procedures and bruising after surgical procedures. We may have to consider discontinuing these medications briefly prior to and shortly after your surgery if safe to do so.  ? ?Please inform us of all medications you are currently taking. All medications that are taken regularly should be taken the day of surgery as you always do. Nevertheless, we need to be informed  of what medications you are taking prior to surgery to know whether they will affect the procedure or cause any complications.  ? ?Please inform us of any medication allergies. Also inform us of whether you have allergies to Latex or rubber products or whether you have had any adverse reaction to Lidocaine or Epinephrine. ? ?Please inform us of any prosthetic or  artificial body parts such as artificial heart valve, joint replacements, etc., or similar condition that might require preoperative antibiotics.  ? ?We recommend avoidance of alcohol at least two weeks prior to surgery and continued avoidance for at least two weeks after surgery.  ? ?We recommend discontinuation of tobacco smoking at least two weeks prior to surgery and continued abstinence for at least two weeks after surgery. ? ?Do not plan strenuous exercise, strenuous work or strenuous lifting for approximately four weeks after your surgery.  ? ?We request if you are unable to make your scheduled surgical appointment, please call us at least a week in advance or as soon as you are aware of a problem so that we can cancel or reschedule the appointment.  ? ?You MAY TAKE TYLENOL (acetaminophen) for pain as it is not a blood thinner.  ? ?PLEASE PLAN TO BE IN TOWN FOR TWO WEEKS FOLLOWING SURGERY, THIS IS IMPORTANT SO YOU CAN BE CHECKED FOR DRESSING CHANGES, SUTURE REMOVAL AND TO MONITOR FOR POSSIBLE COMPLICATIONS.  ? ? ? ?If You Need Anything After Your Visit ? ?If you have any questions or concerns for your doctor, please call our main line at [phone] and press option 4 to reach your doctor's medical assistant. If no one answers, please leave a voicemail as directed and we will return your call as soon as possible. Messages left after 4 pm will be answered the following business day.  ? ?You may also send [name] a message via MyChart. We typically respond to MyChart messages within 1-2 business days. ? ?For prescription refills, please ask your pharmacy to contact our office. Our fax number is [phone]. ? ?If you have an urgent issue when the clinic is closed that cannot wait until the next business day, you can page your doctor at the number below.   ? ?Please note that while we do our best to be available for urgent issues outside of office hours, we are not available 24/7.  ? ?If you have an urgent issue  and are unable to reach [name], you may choose to seek medical care at your doctor's office, retail clinic, urgent care center, or emergency room. ? ?If you have a medical emergency, please immediately call 911 or go to the emergency department. ? ?Pager Numbers ? ?- Dr. Nehemiah Massed: [phone] ? ?- Dr. Laurence Ferrari: [phone] ? ?- Dr. Nicole Kindred: [phone] ? ?In the event of inclement weather, please call our main line at [phone] for an update on the status of any delays or closures. ? ?Dermatology Medication Tips: ?Please keep the boxes that topical medications come in in order to help keep track of the instructions about where and how to use these. Pharmacies typically print the medication instructions only on the boxes and not directly on the medication tubes.  ? ?If your medication is too expensive, please contact our office at [phone] option 4 or send [name] a message through [place].  ? ?We are unable to tell what your co-pay for medications will be in advance as this is different depending on your insurance coverage. However, we may be able to find a  substitute medication at lower cost or fill out paperwork to get insurance to cover a needed medication.  ? ?If a prior authorization is required to get your medication covered by your insurance company, please allow [name] 1-2 business days to complete this process. ? ?Drug prices often vary depending on where the prescription is filled and some pharmacies may offer cheaper prices. ? ?The website www.goodrx.com contains coupons for medications through different pharmacies. The prices here do not account for what the cost may be with help from insurance (it may be cheaper with your insurance), but the website can give you the price if you did not use any insurance.  ?- You can print the associated coupon and take it with your prescription to the pharmacy.  ?- You may also stop by our office during regular business hours and pick up a GoodRx coupon card.  ?- If you need  your prescription sent electronically to a different pharmacy, notify our office through [facility] or by phone at [phone] option 4. ? ? ? ? ?Si Usted Necesita Algo Despu?s de Su Visita ? ?Tambi?n

## 2021-07-08 NOTE — Progress Notes (Signed)
? ?  Follow-Up Visit ?  ?Subjective  ?Mario [name] is a 71 y.o. male who presents for the following: Seborrheic Keratosis (4 month recheck. Hx of ISK's treated with LN2 at B/L cheeks). Abdominal and knee surgery in next couple of months.  ?The patient has spots, moles and lesions to be evaluated, some may be new or changing and the patient has concerns that these could be cancer. ? ?The following portions of the chart were reviewed this encounter and updated as appropriate:  Tobacco  Allergies  Meds  Problems  Med Hx  Surg Hx  Fam Hx   ?  ?Review of Systems: No other skin or systemic complaints except as noted in HPI or Assessment and Plan. ? ?Objective  ?Well appearing patient in no apparent distress; mood and affect are within normal limits. ? ?A focused examination was performed including face, neck. Relevant physical exam findings are noted in the Assessment and Plan. ? ?Posterior Mid Base of Neck ?Subcutaneous nodule. 1.0 cm ? ? ?Assessment & Plan  ?Epidermal inclusion cyst ?Posterior Mid Base of Neck ? ?Benign-appearing. Exam most consistent with an epidermal inclusion cyst. Discussed that a cyst is a benign growth that can grow over time and sometimes get irritated or inflamed. Recommend observation if it is not bothersome. Discussed option of surgical excision to remove it if it is growing, symptomatic, or other changes noted. Please call for new or changing lesions so they can be evaluated. ? ?Patient will schedule surgery after September.  ? ?Seborrheic Keratoses. Face ?- Stuck-on, waxy, tan-brown papules and/or plaques  ?- Benign-appearing ?- Discussed benign etiology and prognosis. ?- Observe ?- Call for any changes ? ?Actinic Damage ?- chronic, secondary to cumulative UV radiation exposure/sun exposure over time ?- diffuse scaly erythematous macules with underlying dyspigmentation ?- Recommend daily broad spectrum sunscreen SPF 30+ to sun-exposed areas, reapply every 2 hours as needed.  ?-  Recommend staying in the shade or wearing long sleeves, sun glasses (UVA+UVB protection) and wide brim hats (4-inch brim around the entire circumference of the hat). ?- Call for new or changing lesions. ? ?Return for Cyst Excision, ISK Follow Up. ? ?I, Emelia Salisbury, CMA, am acting as scribe for Sarina Ser, MD. ?Documentation: I have reviewed the above documentation for accuracy and completeness, and I agree with the above. ? ?Sarina Ser, MD ? ? ?

## 2021-07-16 ENCOUNTER — Encounter: Payer: Self-pay | Admitting: Dermatology

## 2021-08-12 ENCOUNTER — Ambulatory Visit: Payer: PRIVATE HEALTH INSURANCE | Admitting: Dermatology

## 2021-11-19 ENCOUNTER — Ambulatory Visit (INDEPENDENT_AMBULATORY_CARE_PROVIDER_SITE_OTHER): Payer: Medicare Other | Admitting: Dermatology

## 2021-11-19 ENCOUNTER — Telehealth: Payer: Self-pay

## 2021-11-19 DIAGNOSIS — L720 Epidermal cyst: Secondary | ICD-10-CM | POA: Diagnosis not present

## 2021-11-19 DIAGNOSIS — L820 Inflamed seborrheic keratosis: Secondary | ICD-10-CM

## 2021-11-19 DIAGNOSIS — D492 Neoplasm of unspecified behavior of bone, soft tissue, and skin: Secondary | ICD-10-CM

## 2021-11-19 MED ORDER — MUPIROCIN 2 % EX OINT: 1.0000 | TOPICAL_OINTMENT | Freq: Every day | CUTANEOUS | 1 refills | Status: DC

## 2021-11-19 NOTE — Progress Notes (Signed)
Follow-Up Visit   Subjective  Mario [name] is a 71 y.o. male who presents for the following: Cyst (Post mid base of neck, pt presents for excision today). He also complains of irritating spot on the right cheek.  He would like treated.  The following portions of the chart were reviewed this encounter and updated as appropriate:   Tobacco  Allergies  Meds  Problems  Med Hx  Surg Hx  Fam Hx     Review of Systems:  No other skin or systemic complaints except as noted in HPI or Assessment and Plan.  Objective  Well appearing patient in no apparent distress; mood and affect are within normal limits.  A focused examination was performed including neck. Relevant physical exam findings are noted in the Assessment and Plan.  post base of neck Cystic pap 1.5cm  R cheek x 1 Stuck on waxy paps with erythema   Assessment & Plan  Neoplasm of skin -suspected growing changing symptomatic cyst post base of neck  Skin excision  Lesion length (cm):  1.5 Lesion width (cm):  1.5 Margin per side (cm):  0 Total excision diameter (cm):  1.5 Informed consent: discussed and consent obtained   Timeout: patient name, date of birth, surgical site, and procedure verified   Procedure prep:  Patient was prepped and draped in usual sterile fashion Prep type:  Isopropyl alcohol and povidone-iodine Anesthesia: the lesion was anesthetized in a standard fashion   Anesthetic:  1% lidocaine w/ epinephrine 1-100,000 buffered w/ 8.4% NaHCO3 (3cc lido w/ epi, 3cc bupivicaine, Total of 6cc) Instrument used: #15 blade   Hemostasis achieved with: pressure   Hemostasis achieved with comment:  Electrocautery Outcome: patient tolerated procedure well with no complications   Post-procedure details: sterile dressing applied and wound care instructions given   Dressing type: bandage, pressure dressing and bacitracin (Mupirocin)    Skin repair Complexity:  Complex Final length (cm):  3 Reason for type of  repair: reduce tension to allow closure, reduce the risk of dehiscence, infection, and necrosis, reduce subcutaneous dead space and avoid a hematoma, allow closure of the large defect, preserve normal anatomy, preserve normal anatomical and functional relationships and enhance both functionality and cosmetic results   Undermining: area extensively undermined   Undermining comment:  Undermining Defect 1.5cm Subcutaneous layers (deep stitches):  Suture size:  2-0 Suture type: Vicryl (polyglactin 910)   Subcutaneous suture technique: Inverted Dermal. Fine/surface layer approximation (top stitches):  Suture size:  3-0 Suture type: nylon   Stitches: simple running   Suture removal (days):  7 Hemostasis achieved with: pressure Outcome: patient tolerated procedure well with no complications   Post-procedure details: sterile dressing applied and wound care instructions given   Dressing type: bandage, pressure dressing and bacitracin (Mupirocin)    mupirocin ointment (BACTROBAN) 2 % Apply 1 Application topically daily. Qd to excision site  Specimen 1 - Surgical pathology Differential Diagnosis: D48.5 Cyst vs other  Check Margins: No Cystic pap 1.5cm  Cyst vs other, excised today Start Mupirocin oint qd to excision site  Inflamed seborrheic keratosis R cheek x 1 Symptomatic, irritating, patient would like treated. Destruction of lesion - R cheek x 1 Complexity: simple   Destruction method: cryotherapy   Informed consent: discussed and consent obtained   Timeout:  patient name, date of birth, surgical site, and procedure verified Lesion destroyed using liquid nitrogen: Yes   Region frozen until ice ball extended beyond lesion: Yes   Outcome: patient tolerated procedure well with  no complications   Post-procedure details: wound care instructions given    Return in about 1 week (around 11/26/2021) for suture removal.  I, Othelia Pulling, RMA, am acting as scribe for Sarina Ser, MD  . Documentation: I have reviewed the above documentation for accuracy and completeness, and I agree with the above.  Sarina Ser, MD

## 2021-11-19 NOTE — Patient Instructions (Addendum)

## 2021-11-19 NOTE — Telephone Encounter (Signed)
Pt doing well after todays surgery./sh 

## 2021-11-22 ENCOUNTER — Encounter: Payer: Self-pay | Admitting: Dermatology

## 2021-11-26 ENCOUNTER — Ambulatory Visit (INDEPENDENT_AMBULATORY_CARE_PROVIDER_SITE_OTHER): Payer: Medicare Other | Admitting: Dermatology

## 2021-11-26 ENCOUNTER — Encounter: Payer: Self-pay | Admitting: Dermatology

## 2021-11-26 DIAGNOSIS — Z4802 Encounter for removal of sutures: Secondary | ICD-10-CM

## 2021-11-26 DIAGNOSIS — L729 Follicular cyst of the skin and subcutaneous tissue, unspecified: Secondary | ICD-10-CM

## 2021-11-26 NOTE — Progress Notes (Signed)
   Follow-Up Visit   Subjective  Mario [name] is a 71 y.o. male who presents for the following: Suture / Staple Removal (Posterior base of neck. S/P epidermal cyst removal).  The following portions of the chart were reviewed this encounter and updated as appropriate:  Tobacco  Allergies  Meds  Problems  Med Hx  Surg Hx  Fam Hx     Review of Systems: No other skin or systemic complaints except as noted in HPI or Assessment and Plan.  Objective  Well appearing patient in no apparent distress; mood and affect are within normal limits.  A focused examination was performed including posterior neck. Relevant physical exam findings are noted in the Assessment and Plan.   Assessment & Plan  Postop cyst removal Encounter for Removal of Sutures - Incision site at the posterior base of neck is clean, dry and intact - Wound cleansed, sutures removed, wound cleansed and steri strips applied.  - Discussed pathology results showing epidermal inclusion cyst  - Patient advised to keep steri-strips dry until they fall off. - Scars remodel for a full year. - Once steri-strips fall off, patient can apply over-the-counter silicone scar cream each night to help with scar remodeling if desired. - Patient advised to call with any concerns or if they notice any new or changing lesions.  Cyst of skin   Return if symptoms worsen or fail to improve.  I, Emelia Salisbury, CMA, am acting as scribe for Sarina Ser, MD. Documentation: I have reviewed the above documentation for accuracy and completeness, and I agree with the above.  Sarina Ser, MD

## 2021-11-26 NOTE — Patient Instructions (Signed)
Due to recent changes in healthcare laws, you may see results of your pathology and/or laboratory studies on MyChart before the doctors have had a chance to review them. We understand that in some cases there may be results that are confusing or concerning to you. Please understand that not all results are received at the same time and often the doctors may need to interpret multiple results in order to provide you with the best plan of care or course of treatment. Therefore, we ask that you please give us 2 business days to thoroughly review all your results before contacting the office for clarification. Should we see a critical lab result, you will be contacted sooner.   If You Need Anything After Your Visit  If you have any questions or concerns for your doctor, please call our main line at [phone] and press option 4 to reach your doctor's medical assistant. If no one answers, please leave a voicemail as directed and we will return your call as soon as possible. Messages left after 4 pm will be answered the following business day.   You may also send us a message via MyChart. We typically respond to MyChart messages within 1-2 business days.  For prescription refills, please ask your pharmacy to contact our office. Our fax number is [phone].  If you have an urgent issue when the clinic is closed that cannot wait until the next business day, you can page your doctor at the number below.    Please note that while we do our best to be available for urgent issues outside of office hours, we are not available 24/7.   If you have an urgent issue and are unable to reach us, you may choose to seek medical care at your doctor's office, retail clinic, urgent care center, or emergency room.  If you have a medical emergency, please immediately call 911 or go to the emergency department.  Pager Numbers  - Dr. Kowalski: [phone]  - Dr. Moye: [phone]  - Dr. Stewart:  [phone]  In the event of inclement weather, please call our main line at [phone] for an update on the status of any delays or closures.  Dermatology Medication Tips: Please keep the boxes that topical medications come in in order to help keep track of the instructions about where and how to use these. Pharmacies typically print the medication instructions only on the boxes and not directly on the medication tubes.   If your medication is too expensive, please contact our office at [phone] option 4 or send us a message through MyChart.   We are unable to tell what your co-pay for medications will be in advance as this is different depending on your insurance coverage. However, we may be able to find a substitute medication at lower cost or fill out paperwork to get insurance to cover a needed medication.   If a prior authorization is required to get your medication covered by your insurance company, please allow us 1-2 business days to complete this process.  Drug prices often vary depending on where the prescription is filled and some pharmacies may offer cheaper prices.  The website www.goodrx.com contains coupons for medications through different pharmacies. The prices here do not account for what the cost may be with help from insurance (it may be cheaper with your insurance), but the website can give you the price if you did not use any insurance.  - You can print the associated coupon and take it with   your prescription to the pharmacy.  - You may also stop by our office during regular business hours and pick up a GoodRx coupon card.  - If you need your prescription sent electronically to a different pharmacy, notify our office through [place] MyChart or by phone at [phone] option 4.     Si Usted Necesita Algo Despus de Su Visita  Tambin puede enviarnos un mensaje a travs de MyChart. Por lo general respondemos a los mensajes de MyChart en el transcurso de 1 a 2  das hbiles.  Para renovar recetas, por favor pida a su farmacia que se ponga en contacto con nuestra oficina. Nuestro nmero de fax es el [phone].  Si tiene un asunto urgente cuando la clnica est cerrada y que no puede esperar hasta el siguiente da hbil, puede llamar/localizar a su doctor(a) al nmero que aparece a continuacin.   Por favor, tenga en cuenta que aunque hacemos todo lo posible para estar disponibles para asuntos urgentes fuera del horario de oficina, no estamos disponibles las 24 horas del da, los 7 das de la semana.   Si tiene un problema urgente y no puede comunicarse con nosotros, puede optar por buscar atencin mdica  en el consultorio de su doctor(a), en una clnica privada, en un centro de atencin urgente o en una sala de emergencias.  Si tiene una emergencia mdica, por favor llame inmediatamente al 911 o vaya a la sala de emergencias.  Nmeros de bper  - Dr. Kowalski: [phone]  - Dra. Moye: [phone]  - Dra. Stewart: [phone]  En caso de inclemencias del tiempo, por favor llame a nuestra lnea principal al [phone] para una actualizacin sobre el estado de cualquier retraso o cierre.  Consejos para la medicacin en dermatologa: Por favor, guarde las cajas en las que vienen los medicamentos de uso tpico para ayudarle a seguir las instrucciones sobre dnde y cmo usarlos. Las farmacias generalmente imprimen las instrucciones del medicamento slo en las cajas y no directamente en los tubos del medicamento.   Si su medicamento es muy caro, por favor, pngase en contacto con nuestra oficina llamando al [phone] y presione la opcin 4 o envenos un mensaje a travs de MyChart.   No podemos decirle cul ser su copago por los medicamentos por adelantado ya que esto es diferente dependiendo de la cobertura de su seguro. Sin embargo, es posible que podamos encontrar un medicamento sustituto a menor costo o llenar un formulario para que el  seguro cubra el medicamento que se considera necesario.   Si se requiere una autorizacin previa para que su compaa de seguros cubra su medicamento, por favor permtanos de 1 a 2 das hbiles para completar este proceso.  Los precios de los medicamentos varan con frecuencia dependiendo del lugar de dnde se surte la receta y alguna farmacias pueden ofrecer precios ms baratos.  El sitio web www.goodrx.com tiene cupones para medicamentos de diferentes farmacias. Los precios aqu no tienen en cuenta lo que podra costar con la ayuda del seguro (puede ser ms barato con su seguro), pero el sitio web puede darle el precio si no utiliz ningn seguro.  - Puede imprimir el cupn correspondiente y llevarlo con su receta a la farmacia.  - Tambin puede pasar por nuestra oficina durante el horario de atencin regular y recoger una tarjeta de cupones de GoodRx.  - Si necesita que su receta se enve electrnicamente a una farmacia diferente, informe a nuestra oficina a travs de MyChart de [place]   o por telfono llamando al [phone] y presione la opcin 4.  

## 2022-09-26 ENCOUNTER — Other Ambulatory Visit: Payer: Self-pay

## 2022-09-26 ENCOUNTER — Emergency Department: Payer: Medicare Other

## 2022-09-26 ENCOUNTER — Emergency Department
Admission: EM | Admit: 2022-09-26 | Discharge: 2022-09-26 | Disposition: A | Discharge status: Home / Self Care | Payer: Medicare Other | Attending: Emergency Medicine | Admitting: Emergency Medicine

## 2022-09-26 DIAGNOSIS — X501XXA Overexertion from prolonged static or awkward postures, initial encounter: Secondary | ICD-10-CM | POA: Insufficient documentation

## 2022-09-26 DIAGNOSIS — E119 Type 2 diabetes mellitus without complications: Secondary | ICD-10-CM | POA: Diagnosis not present

## 2022-09-26 DIAGNOSIS — S39012A Strain of muscle, fascia and tendon of lower back, initial encounter: Secondary | ICD-10-CM | POA: Diagnosis not present

## 2022-09-26 DIAGNOSIS — S3992XA Unspecified injury of lower back, initial encounter: Secondary | ICD-10-CM | POA: Diagnosis present

## 2022-09-26 MED ORDER — HYDROCODONE-ACETAMINOPHEN 5-325 MG PO TABS: 1.0000 | ORAL_TABLET | Freq: Every day | ORAL | 0 refills | Status: AC

## 2022-09-26 MED ORDER — ACETAMINOPHEN 325 MG PO TABS
650.0000 mg | ORAL_TABLET | Freq: Once | ORAL | Status: AC
  Administered 2022-09-26: 650 mg via ORAL
  Filled 2022-09-26: qty 2

## 2022-09-26 MED ORDER — KETOROLAC TROMETHAMINE 15 MG/ML IJ SOLN
15.0000 mg | Freq: Once | INTRAMUSCULAR | Status: AC
  Administered 2022-09-26: 15 mg via INTRAMUSCULAR

## 2022-09-26 MED ORDER — LIDOCAINE 5 % EX PTCH
1.0000 | MEDICATED_PATCH | CUTANEOUS | Status: DC
  Administered 2022-09-26: 1 via TRANSDERMAL
  Filled 2022-09-26: qty 1

## 2022-09-26 MED ORDER — KETOROLAC TROMETHAMINE 15 MG/ML IJ SOLN
INTRAMUSCULAR | Status: AC
  Filled 2022-09-26: qty 1

## 2022-09-26 NOTE — ED Notes (Signed)
Pt verbalizes understanding of discharge instructions.

## 2022-09-26 NOTE — Discharge Instructions (Addendum)
Take your tizanidine at night for muscle spasms.  Follow-up with orthopedics.

## 2022-09-26 NOTE — ED Triage Notes (Signed)
Pt to ed from home via POV for lower back pain. Pt was moving some things around at home yesterday when he felt a pop and has been in pain ever since. Pt has been taking OTC meds and using ice packs with no relief at home. Pt is caox4, in no acute distress and in a wheel chair in triage. Pt drove himself to ER and walked into lobby.

## 2022-09-26 NOTE — ED Provider Notes (Signed)
[facility] Emergency Department Provider Note     Event Date/Time   First MD Initiated Contact with Patient 09/26/22 1646     (approximate)   History   Back Pain   HPI  Berthel Bagnall is a 72 y.o. male with a history of diabetes, arthritis, A-fib and chronic back pain who presents to the ED for localized right lower back pain with onset yesterday.  Patient reports he reached over his body to grab a toothbrush on his bathroom sink and felt a pop in his right lower back.  Patient has not taken anything for pain.  He reports trying ice packs with no relief.  He states movement makes the pain worse and sitting improves the pain.  He denies urinary symptoms, loss of bowel and bladder control or trauma.  No fever or chills to report.  Patient reports spinal fusion surgical history.     Physical Exam   Triage Vital Signs: ED Triage Vitals [09/26/22 1534]  Encounter Vitals Group     BP 105/87     Systolic BP Percentile      Diastolic BP Percentile      Pulse Rate 75     Resp 16     Temp 98 F (36.7 C)     Temp Source Oral     SpO2 98 %     Weight 297 lb 9.9 oz (135 kg)     Height 5\' 9"  (1.753 m)     Head Circumference      Peak Flow      Pain Score 10     Pain Loc      Pain Education      Exclude from Growth Chart     Most recent vital signs: Vitals:   09/26/22 1534 09/26/22 1859  BP: 105/87 108/86  Pulse: 75 74  Resp: 16 16  Temp: 98 F (36.7 C) 98.1 F (36.7 C)  SpO2: 98% 98%    General: Well appearing. Alert and oriented. INAD.  Skin:  Warm, dry and intact. No rashes or lesions noted.     Head:  NCAT.  Eyes:  PERRLA. EOMI. Conjunctivae clear.  Neck:   No cervical spine tenderness to palpation.  CV:  Good peripheral perfusion. RRR. No peripheral edema.  RESP:  Normal effort. LCTAB.  ABD:  No distention. Soft, Non tender. No masses or organomegaly.  No CVA tenderness bilaterally. BACK:  Spinous process is midline without deformity or  tenderness.  TTP to right paraspinal muscle in the lumbar region. MSK:   Full ROM in all joints. No swelling, deformity or tenderness.  NEURO: Cranial nerves II-XII intact. No focal deficits. Sensation and motor function intact.   ED Results / Procedures / Treatments   Labs (all labs ordered are listed, but only abnormal results are displayed) Labs Reviewed - No data to display  RADIOLOGY  I personally viewed and evaluated these images as part of my medical decision making, as well as reviewing the written report by the radiologist.  ED Provider Interpretation: Lumbar spine x-ray is unremarkable  DG Lumbar Spine 2-3 Views  Result Date: 09/26/2022 CLINICAL DATA:  Pain. EXAM: LUMBAR SPINE - 2-3 VIEW COMPARISON:  CT lumbar spine 12/24/2018. FINDINGS: Three views of the lumbar spine. Unchanged postoperative appearance from prior L5-S1 interbody and posterior spinal fusion. Solid bony fusion across the disc space. Hardware is intact. No associated lucency or fracture. Normal vertebral body heights. Multilevel lumbar spondylosis. IMPRESSION: Unchanged postoperative appearance  from prior L5-S1 interbody and posterior spinal fusion. No evidence of hardware complication. Electronically Signed   By: Orvan Falconer M.D.   On: 09/26/2022 16:33     PROCEDURES:  Critical Care performed: No  Procedures  MEDICATIONS ORDERED IN ED: Medications  lidocaine (LIDODERM) 5 % 1 patch (1 patch Transdermal Patch Applied 09/26/22 1823)  ketorolac (TORADOL) 15 MG/ML injection (has no administration in time range)  acetaminophen (TYLENOL) tablet 650 mg (650 mg Oral Given 09/26/22 1822)  ketorolac (TORADOL) 15 MG/ML injection 15 mg (15 mg Intramuscular Given 09/26/22 1855)   IMPRESSION / MDM / ASSESSMENT AND PLAN / ED COURSE  I reviewed the triage vital signs and the nursing notes.                               72 y.o. male presents to the emergency department for evaluation and treatment of acute on chronic  lower back pain. See HPI for further details.   Differential diagnosis includes, but is not limited to fracture, misalignment, spinal stenosis, muscle strain, arthritis.  Given patient's history and physical exam findings of paraspinal tenderness, patient's presentation is clinically consistent with a musculoskeletal etiology.  Lumbar x-ray reveals no hardware complications and unchanged postoperative appearance of his previous spinal surgery imaging.  Patient endorses chronic back pain and is seeking further evaluation.  He reports he does not want to follow-up with his current orthopedic surgeon who performed his bilateral hip replacement and is seeking for a new provider for further management.  He is given Tylenol and a Toradol injection for his pain.  A lidocaine patch is placed on the area of concern.  He does report some improvement.  At this time patient is hemodynamically stable enough for discharge and outpatient follow-up he is to follow-up with orthopedic as soon as possible for his acute on chronic pain back pain.  He will be discharged home with prescriptions of hydrocodone-acetaminophen until he can establish further care.  He is encouraged to continue to take his tizanidine at night for muscle spasms.  All questions and concerns were addressed during ED visit.    Patient's presentation is most consistent with acute complicated illness / injury requiring diagnostic workup.  FINAL CLINICAL IMPRESSION(S) / ED DIAGNOSES   Final diagnoses:  Strain of lumbar region, initial encounter   Rx / DC Orders   ED Discharge Orders          Ordered    HYDROcodone-acetaminophen (NORCO/VICODIN) 5-325 MG tablet  Daily        09/26/22 1859             Note:  This document was prepared using Dragon voice recognition software and may include unintentional dictation errors.    Romeo Apple, Byrant Valent A, PA-C 09/26/22 2303    Minna Antis, MD 09/30/22 1418

## 2022-10-17 ENCOUNTER — Other Ambulatory Visit: Payer: Self-pay | Admitting: Student

## 2022-10-17 ENCOUNTER — Encounter: Payer: Self-pay | Admitting: Ophthalmology

## 2022-10-17 DIAGNOSIS — M5416 Radiculopathy, lumbar region: Secondary | ICD-10-CM

## 2022-10-17 DIAGNOSIS — M4326 Fusion of spine, lumbar region: Secondary | ICD-10-CM

## 2022-10-17 DIAGNOSIS — G8929 Other chronic pain: Secondary | ICD-10-CM

## 2022-10-20 NOTE — Discharge Instructions (Signed)
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
## 2022-10-22 ENCOUNTER — Encounter: Payer: Self-pay | Admitting: Ophthalmology

## 2022-10-22 ENCOUNTER — Encounter
Admission: RE | Disposition: A | Discharge status: Home / Self Care | Payer: Self-pay | Source: Home / Self Care | Attending: Ophthalmology

## 2022-10-22 ENCOUNTER — Ambulatory Visit
Admission: RE | Admit: 2022-10-22 | Discharge: 2022-10-22 | Disposition: A | Discharge status: Home / Self Care | Payer: Medicare Other | Attending: Ophthalmology | Admitting: Ophthalmology

## 2022-10-22 ENCOUNTER — Ambulatory Visit: Payer: Medicare Other | Admitting: Anesthesiology

## 2022-10-22 ENCOUNTER — Ambulatory Visit: Payer: Self-pay | Admitting: Anesthesiology

## 2022-10-22 ENCOUNTER — Other Ambulatory Visit: Payer: Self-pay

## 2022-10-22 DIAGNOSIS — M1990 Unspecified osteoarthritis, unspecified site: Secondary | ICD-10-CM | POA: Diagnosis not present

## 2022-10-22 DIAGNOSIS — Z7901 Long term (current) use of anticoagulants: Secondary | ICD-10-CM | POA: Insufficient documentation

## 2022-10-22 DIAGNOSIS — Z8701 Personal history of pneumonia (recurrent): Secondary | ICD-10-CM | POA: Insufficient documentation

## 2022-10-22 DIAGNOSIS — K219 Gastro-esophageal reflux disease without esophagitis: Secondary | ICD-10-CM | POA: Diagnosis not present

## 2022-10-22 DIAGNOSIS — F40240 Claustrophobia: Secondary | ICD-10-CM | POA: Insufficient documentation

## 2022-10-22 DIAGNOSIS — E1136 Type 2 diabetes mellitus with diabetic cataract: Secondary | ICD-10-CM | POA: Insufficient documentation

## 2022-10-22 DIAGNOSIS — Z7984 Long term (current) use of oral hypoglycemic drugs: Secondary | ICD-10-CM | POA: Insufficient documentation

## 2022-10-22 DIAGNOSIS — G709 Myoneural disorder, unspecified: Secondary | ICD-10-CM | POA: Insufficient documentation

## 2022-10-22 DIAGNOSIS — H2512 Age-related nuclear cataract, left eye: Secondary | ICD-10-CM | POA: Diagnosis not present

## 2022-10-22 DIAGNOSIS — K449 Diaphragmatic hernia without obstruction or gangrene: Secondary | ICD-10-CM | POA: Diagnosis not present

## 2022-10-22 DIAGNOSIS — G4730 Sleep apnea, unspecified: Secondary | ICD-10-CM | POA: Diagnosis not present

## 2022-10-22 DIAGNOSIS — I480 Paroxysmal atrial fibrillation: Secondary | ICD-10-CM | POA: Insufficient documentation

## 2022-10-22 DIAGNOSIS — F32A Depression, unspecified: Secondary | ICD-10-CM | POA: Insufficient documentation

## 2022-10-22 HISTORY — DX: Cardiac arrhythmia, unspecified: I49.9

## 2022-10-22 HISTORY — DX: Paroxysmal atrial fibrillation: I48.0

## 2022-10-22 HISTORY — DX: Post-traumatic stress disorder, chronic: F43.12

## 2022-10-22 HISTORY — PX: CATARACT EXTRACTION W/PHACO: SHX586

## 2022-10-22 HISTORY — DX: Panic disorder (episodic paroxysmal anxiety): F41.0

## 2022-10-22 HISTORY — DX: Dizziness and giddiness: R42

## 2022-10-22 HISTORY — DX: Personal history of other diseases of the musculoskeletal system and connective tissue: Z87.39

## 2022-10-22 HISTORY — DX: Obstructive sleep apnea (adult) (pediatric): G47.33

## 2022-10-22 HISTORY — DX: Post-traumatic stress disorder, unspecified: F43.10

## 2022-10-22 LAB — GLUCOSE, CAPILLARY
Glucose-Capillary: 78 mg/dL (ref 70–99)
Glucose-Capillary: 86 mg/dL (ref 70–99)

## 2022-10-22 SURGERY — PHACOEMULSIFICATION, CATARACT, WITH IOL INSERTION
Anesthesia: General | Site: Eye | Laterality: Left

## 2022-10-22 MED ORDER — TIMOLOL MALEATE 0.5 % OP SOLN
OPHTHALMIC | Status: DC | PRN
  Administered 2022-10-22: 1 [drp] via OPHTHALMIC

## 2022-10-22 MED ORDER — DEXMEDETOMIDINE HCL IN NACL 200 MCG/50ML IV SOLN
INTRAVENOUS | Status: DC | PRN
  Administered 2022-10-22: 8 ug via INTRAVENOUS
  Administered 2022-10-22 (×3): 4 ug via INTRAVENOUS

## 2022-10-22 MED ORDER — FENTANYL CITRATE (PF) 100 MCG/2ML IJ SOLN
INTRAMUSCULAR | Status: DC | PRN
  Administered 2022-10-22 (×2): 50 ug via INTRAVENOUS

## 2022-10-22 MED ORDER — SIGHTPATH DOSE#1 NA HYALUR & NA CHOND-NA HYALUR IO KIT
PACK | INTRAOCULAR | Status: DC | PRN
  Administered 2022-10-22: 1 via OPHTHALMIC

## 2022-10-22 MED ORDER — CEFUROXIME OPHTHALMIC INJECTION 1 MG/0.1 ML
INJECTION | OPHTHALMIC | Status: DC | PRN
  Administered 2022-10-22: .1 mL via INTRACAMERAL

## 2022-10-22 MED ORDER — LACTATED RINGERS IV SOLN: INTRAVENOUS | Status: DC

## 2022-10-22 MED ORDER — PROPOFOL 10 MG/ML IV BOLUS
INTRAVENOUS | Status: DC | PRN
Start: 2022-10-22 — End: 2022-10-22
  Administered 2022-10-22: 20 mg via INTRAVENOUS
  Administered 2022-10-22: 30 mg via INTRAVENOUS
  Administered 2022-10-22: 20 mg via INTRAVENOUS

## 2022-10-22 MED ORDER — LIDOCAINE HCL (CARDIAC) PF 100 MG/5ML IV SOSY
PREFILLED_SYRINGE | INTRAVENOUS | Status: DC | PRN
  Administered 2022-10-22: 80 mg via INTRAVENOUS

## 2022-10-22 MED ORDER — SIGHTPATH DOSE#1 BSS IO SOLN
INTRAOCULAR | Status: DC | PRN
  Administered 2022-10-22: 1 mL

## 2022-10-22 MED ORDER — MIDAZOLAM HCL 5 MG/5ML IJ SOLN
INTRAMUSCULAR | Status: DC | PRN
  Administered 2022-10-22 (×2): 2 mg via INTRAVENOUS

## 2022-10-22 MED ORDER — ARMC OPHTHALMIC DILATING DROPS
1.0000 | OPHTHALMIC | Status: DC | PRN
  Administered 2022-10-22 (×3): 1 via OPHTHALMIC

## 2022-10-22 MED ORDER — TETRACAINE HCL 0.5 % OP SOLN
1.0000 [drp] | OPHTHALMIC | Status: DC | PRN
  Administered 2022-10-22 (×3): 1 [drp] via OPHTHALMIC

## 2022-10-22 MED ORDER — SIGHTPATH DOSE#1 BSS IO SOLN: INTRAOCULAR | Status: DC | PRN

## 2022-10-22 MED ORDER — SIGHTPATH DOSE#1 BSS IO SOLN
INTRAOCULAR | Status: DC | PRN
  Administered 2022-10-22: 15 mL

## 2022-10-22 MED ORDER — ONDANSETRON HCL 4 MG/2ML IJ SOLN
INTRAMUSCULAR | Status: DC | PRN
  Administered 2022-10-22: 4 mg via INTRAVENOUS

## 2022-10-22 SURGICAL SUPPLY — 18 items
CANNULA ANT/CHMB 27G (MISCELLANEOUS) IMPLANT
CANNULA ANT/CHMB 27GA (MISCELLANEOUS)
CATARACT SUITE SIGHTPATH (MISCELLANEOUS) ×1
FEE CATARACT SUITE SIGHTPATH (MISCELLANEOUS) ×1 IMPLANT
GLOVE SRG 8 PF TXTR STRL LF DI (GLOVE) ×1 IMPLANT
GLOVE SURG ENC TEXT LTX SZ7.5 (GLOVE) ×1 IMPLANT
GLOVE SURG GAMMEX PI TX LF 7.5 (GLOVE) IMPLANT
GLOVE SURG UNDER POLY LF SZ8 (GLOVE) ×1
LENS IOL TECNIS EYHANCE 18.5 (Intraocular Lens) IMPLANT
NDL FILTER BLUNT 18X1 1/2 (NEEDLE) ×1 IMPLANT
NDL RETROBULBAR .5 NSTRL (NEEDLE) IMPLANT
NEEDLE FILTER BLUNT 18X1 1/2 (NEEDLE) ×1
PACK VIT ANT 23G (MISCELLANEOUS) IMPLANT
RING MALYGIN 7.0 (MISCELLANEOUS) IMPLANT
SUT ETHILON 10-0 CS-B-6CS-B-6 (SUTURE)
SUT VICRYL 9 0 (SUTURE) IMPLANT
SUTURE EHLN 10-0 CS-B-6CS-B-6 (SUTURE) IMPLANT
SYR 3ML LL SCALE MARK (SYRINGE) ×1 IMPLANT

## 2022-10-22 NOTE — Transfer of Care (Signed)
Immediate Anesthesia Transfer of Care Note  Patient: Mario [name]  Procedure(s) Performed: CATARACT EXTRACTION PHACO AND INTRAOCULAR LENS PLACEMENT (IOC) LEFT DIABETIC (Left: Eye)  Patient Location: PACU  Anesthesia Type: General  Level of Consciousness: awake, alert  and patient cooperative  Airway and Oxygen Therapy: Patient Spontanous Breathing and Patient connected to supplemental oxygen  Post-op Assessment: Post-op Vital signs reviewed, Patient's Cardiovascular Status Stable, Respiratory Function Stable, Patent Airway and No signs of Nausea or vomiting  Post-op Vital Signs: Reviewed and stable  Complications: No notable events documented.

## 2022-10-22 NOTE — H&P (Signed)
[facility]   Primary Care Physician:  Gracelyn Nurse, MD Ophthalmologist: Dr. Lockie Mola  Pre-Procedure History & Physical: HPI:  Mario [name] is a 72 y.o. male here for ophthalmic surgery.   Past Medical History:  Diagnosis Date   Anginal pain (HCC)    Arthritis    Back pain    lower   Chronic post-traumatic stress disorder (PTSD)    Cirrhosis (HCC)    Claustrophobia    Claustrophobia    Complication of anesthesia    "does not take much medication."   Depression    Diabetes mellitus without complication (HCC)    type II   Dyspnea    Dysrhythmia    Elevated lipids    Elevated lipids    Environmental and seasonal allergies    Fatty liver    GERD (gastroesophageal reflux disease)    Glaucoma    Hepatitis    type A   HH (hiatus hernia)    Hip pain Right   History of kidney stones    cystoscopy   History of rheumatoid arthritis    Knee pain    right   OSA on CPAP    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 Had tumors on wrist.   COLONOSCOPY WITH PROPOFOL N/A 04/13/2018   Procedure: COLONOSCOPY WITH PROPOFOL;  Surgeon: Toledo, Boykin Nearing, MD;  Location: [facility] ENDOSCOPY;  Service: Gastroenterology;  Laterality: N/A;   ESOPHAGOGASTRODUODENOSCOPY (EGD) WITH PROPOFOL N/A 04/13/2018   Procedure: ESOPHAGOGASTRODUODENOSCOPY (EGD) WITH PROPOFOL;  Surgeon: Toledo, Boykin Nearing, MD;  Location: [facility] ENDOSCOPY;  Service: Gastroenterology;  Laterality: N/A;   JOINT REPLACEMENT     RADIOLOGY WITH ANESTHESIA N/A 12/15/2019   Procedure: MRI LUMBAR SPINE WITH AND WITHOUT CONTRAST; THORASIC SPINE WITHOUT CONTRAST;  Surgeon: Radiologist, Medication, MD;  Location: [facility] OR;  Service: Radiology;   Laterality: N/A;   REPLACEMENT TOTAL KNEE Left    TOTAL HIP ARTHROPLASTY Right 01/26/2018   Procedure: TOTAL HIP ARTHROPLASTY ANTERIOR APPROACH;  Surgeon: Kennedy Bucker, MD;  Location: [facility] ORS;  Service: Orthopedics;  Laterality: Right;   TOTAL HIP REVISION Right 11/02/2018   Procedure: TOTAL HIP REVISION WITH EXCISION OF HETEROTOPIC OSSIFICATION;  Surgeon: Kennedy Bucker, MD;  Location: [facility] ORS;  Service: Orthopedics;  Laterality: Right;    Prior to Admission medications   Medication Sig Start Date End Date Taking? Authorizing Provider  Camphor-Eucalyptus-Menthol (VICKS VAPORUB EX) Place 1 application into both nostrils at bedtime as needed (congestion).    Yes [provider]  diltiazem (TIAZAC) 120 MG 24 hr capsule Take 120 mg by mouth daily.   Yes [provider]  ELIQUIS 5 MG TABS tablet Take 5 mg by mouth 2 (two) times daily. 08/27/21  Yes [provider]  flecainide (TAMBOCOR) 100 MG tablet Take 100 mg by mouth 2 (two) times daily. 09/05/21  Yes [provider]  gabapentin (NEURONTIN) 300 MG capsule Take 300 mg by mouth 3 (three) times daily.   Yes [provider]  glimepiride (AMARYL) 4 MG tablet Take 4 mg by mouth daily with breakfast.    Yes [provider]  losartan (COZAAR) 25 MG tablet Take 25 mg by mouth daily. 08/06/21  Yes [provider]  meloxicam (MOBIC) 15 MG tablet Take 15 mg by mouth daily as needed for pain.   Yes [provider]  metFORMIN (GLUCOPHAGE) 1000 MG tablet Take 1,000 mg by mouth 2 (two) times daily with a meal.   Yes [provider]  pravastatin (PRAVACHOL) 40 MG tablet Take 40 mg by mouth daily.   Yes [provider]  tiZANidine (ZANAFLEX) 4 MG tablet Take 4 mg by mouth every 8 (eight) hours as needed for muscle spasms.  12/02/19  Yes [provider]  traMADol (ULTRAM) 50 MG tablet Take 50 mg by mouth every 6 (six) hours as needed (per patient).   Yes [provider]  traZODone (DESYREL) 100 MG tablet Take 100 mg by mouth at bedtime.   Yes [provider]  Ascorbic Acid (VITAMIN C) 1000 MG tablet Take 1,000 mg by mouth daily. Patient not taking: Reported on 10/17/2022    [provider]  BAYER ASPIRIN EC LOW DOSE 81 MG EC tablet Take 81 mg by mouth daily. Patient not taking: Reported on 10/17/2022 03/07/19   [provider]  Cholecalciferol (DIALYVITE VITAMIN D 5000) 125 MCG (5000 UT) capsule Take 5,000 Units by mouth daily. Patient not taking: Reported on 01/31/2020    [provider]  diazepam (VALIUM) 5 MG tablet Take 1 tablet (5 mg total) by mouth once as needed for up to 1 dose for anxiety (prior to MRI). Patient not taking: Reported on 11/24/2019 10/31/19   Edward Jolly, MD  Magnesium 400 MG CAPS Take 400 mg by mouth daily.  Patient not taking: Reported on 10/17/2022    [provider]  mupirocin ointment (BACTROBAN) 2 % Apply 1 Application topically daily. Qd to excision site Patient not taking: Reported on 10/17/2022 11/19/21   Deirdre Evener, MD  Oxymetazoline HCl (AFRIN 12 HOUR NA) Place 1 spray into the nose daily as needed (congestion). Patient not taking: Reported on 01/31/2020    [provider]  pantoprazole (PROTONIX) 40 MG tablet Take 40 mg by mouth daily as needed for heartburn. Patient not taking: Reported on 01/05/2020 08/11/19   [provider]  PEPPERMINT OIL PO Take 500 mg by mouth at bedtime. Patient not taking: Reported on 10/17/2022    [provider]  vitamin B-12 (CYANOCOBALAMIN) 1000 MCG tablet Take 1,000 mcg by mouth daily. Patient not taking: Reported on 10/17/2022    [provider]  zinc gluconate 50 MG tablet Take 50 mg by mouth daily. Patient not taking: Reported on 10/17/2022    [provider]    Allergies as of 09/24/2022 - Review Complete 11/26/2021  Allergen Reaction Noted   Levaquin [levofloxacin] Itching 01/12/2018     History reviewed. No pertinent family history.  Social History   Socioeconomic History   Marital status: Married    Spouse name: Not on file   Number of children: Not on file   Years of education: Not on file   Highest education level: Not on file  Occupational History   Not on file  Tobacco Use   Smoking status: Never   Smokeless tobacco: Never  Vaping Use   Vaping status: Never Used  Substance and Sexual Activity   Alcohol use: Not Currently    Comment: rarely    Drug use: Never   Sexual activity: Not Currently  Other Topics Concern   Not on  file  Social History Narrative   Patient has extremely high anxiety that can escalate to panic attacks.    Social Determinants of Health   Financial Resource Strain: Not on file  Food Insecurity: Not on file  Transportation Needs: Not on file  Physical Activity: Not on file  Stress: Not on file  Social Connections: Unknown (07/23/2021)   Received from [facility]   Social Network    Social Network: Not on file  Intimate Partner Violence: Unknown (06/14/2021)   Received from [facility]   HITS    Physically Hurt: Not on file    Insult or Talk Down To: Not on file    Threaten Physical Harm: Not on file    Scream or Curse: Not on file    Review of Systems: See HPI, otherwise negative ROS  Physical Exam: BP (!) 148/67   Temp 98 F (36.7 C) (Temporal)   Ht 5' 9.02" (1.753 m)   Wt 123.8 kg   SpO2 97%   BMI 40.30 kg/m  General:   Alert,  pleasant and cooperative in NAD Head:  Normocephalic and atraumatic. Lungs:  Clear to auscultation.    Heart:  Regular rate and rhythm.   Impression/Plan: Mario [name] is here for ophthalmic surgery.  Risks, benefits, limitations, and alternatives regarding ophthalmic surgery have been reviewed with the patient.  Questions have been answered.  All parties agreeable.   Lockie Mola, MD  10/22/2022, 10:27 AM

## 2022-10-22 NOTE — Op Note (Signed)
  OPERATIVE NOTE  Mario [name] [id number] 10/22/2022   PREOPERATIVE DIAGNOSIS:  Nuclear sclerotic cataract left eye. H25.12   POSTOPERATIVE DIAGNOSIS:    Nuclear sclerotic cataract left eye.     PROCEDURE:  Phacoemusification with posterior chamber intraocular lens placement of the left eye  Ultrasound time: Procedure(s) with comments: CATARACT EXTRACTION PHACO AND INTRAOCULAR LENS PLACEMENT (IOC) LEFT DIABETIC (Left) - 8.82 0:35.0  LENS:   Implant Name Type Inv. Item Serial No. Manufacturer Lot No. LRB No. Used Action  LENS IOL TECNIS EYHANCE 18.5 - [id number] Intraocular Lens LENS IOL TECNIS EYHANCE 18.5 [id number] [employer]  Left 1 Implanted      SURGEON:  Deirdre Evener, MD   ANESTHESIA:  Topical with tetracaine drops and 2% Xylocaine jelly, with deep intravenous sedation   COMPLICATIONS:  None.   DESCRIPTION OF PROCEDURE:  The patient was identified in the holding room and transported to the operating room and placed in the supine position under the operating microscope.  The left eye was identified as the operative eye and it was prepped and draped in the usual sterile ophthalmic fashion.   A 1 millimeter clear-corneal paracentesis was made at the 1:30 position.  0.5 ml of preservative-free 1% lidocaine was injected into the anterior chamber.  The anterior chamber was filled with Viscoat viscoelastic.  A 2.4 millimeter keratome was used to make a near-clear corneal incision at the 10:30 position.  .  A curvilinear capsulorrhexis was made with a cystotome and capsulorrhexis forceps.  Balanced salt solution was used to hydrodissect and hydrodelineate the nucleus.   Phacoemulsification was then used in stop and chop fashion to remove the lens nucleus and epinucleus.  The remaining cortex was then removed using the irrigation and aspiration handpiece. Provisc was then placed into the capsular bag to distend it for lens placement.  A lens was then injected into the capsular  bag.  The remaining viscoelastic was aspirated.   Wounds were hydrated with balanced salt solution.  The anterior chamber was inflated to a physiologic pressure with balanced salt solution.  No wound leaks were noted. Cefuroxime 0.1 ml of a 10mg /ml solution was injected into the anterior chamber for a dose of 1 mg of intracameral antibiotic at the completion of the case.   Timolol and Brimonidine drops were applied to the eye.  The patient was taken to the recovery room in stable condition without complications of anesthesia or surgery.  , 10/22/2022, 11:23 AM

## 2022-10-22 NOTE — Anesthesia Postprocedure Evaluation (Signed)
Anesthesia Post Note  Patient: Mario [name]  Procedure(s) Performed: CATARACT EXTRACTION PHACO AND INTRAOCULAR LENS PLACEMENT (IOC) LEFT DIABETIC (Left: Eye)  Anesthesia Type: General Anesthetic complications: no Comments: Patient very happy with his anesthesia.    No notable events documented.   Last Vitals:  Vitals:   10/22/22 1136 10/22/22 1144  BP: 132/64 (!) 141/66  Pulse: 63 64  Resp: 20 16  Temp: 36.7 C 36.7 C  SpO2: 99% 97%    Last Pain:  Vitals:   10/22/22 1144  TempSrc:   PainSc: 0-No pain                  C 

## 2022-10-23 ENCOUNTER — Encounter: Payer: Self-pay | Admitting: Ophthalmology

## 2022-10-24 ENCOUNTER — Ambulatory Visit
Admission: RE | Admit: 2022-10-24 | Discharge: 2022-10-24 | Disposition: A | Discharge status: Home / Self Care | Payer: Medicare Other | Source: Ambulatory Visit | Attending: Student | Admitting: Student

## 2022-10-24 DIAGNOSIS — M5441 Lumbago with sciatica, right side: Secondary | ICD-10-CM | POA: Insufficient documentation

## 2022-10-24 DIAGNOSIS — M5442 Lumbago with sciatica, left side: Secondary | ICD-10-CM | POA: Insufficient documentation

## 2022-10-24 DIAGNOSIS — M5416 Radiculopathy, lumbar region: Secondary | ICD-10-CM | POA: Insufficient documentation

## 2022-10-24 DIAGNOSIS — G8929 Other chronic pain: Secondary | ICD-10-CM | POA: Diagnosis present

## 2022-10-24 DIAGNOSIS — M4326 Fusion of spine, lumbar region: Secondary | ICD-10-CM | POA: Diagnosis present

## 2022-10-28 ENCOUNTER — Other Ambulatory Visit: Payer: Self-pay | Admitting: Family Medicine

## 2022-10-28 ENCOUNTER — Inpatient Hospital Stay
Admission: RE | Admit: 2022-10-28 | Discharge: 2022-10-28 | Disposition: A | Discharge status: Home / Self Care | Payer: Self-pay | Source: Ambulatory Visit | Attending: Neurosurgery | Admitting: Neurosurgery

## 2022-10-28 DIAGNOSIS — Z049 Encounter for examination and observation for unspecified reason: Secondary | ICD-10-CM
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
## 2022-11-03 NOTE — Discharge Instructions (Signed)

## 2022-11-04 ENCOUNTER — Ambulatory Visit: Payer: PRIVATE HEALTH INSURANCE | Admitting: Orthopedic Surgery

## 2022-11-05 ENCOUNTER — Ambulatory Visit: Payer: Self-pay | Admitting: Anesthesiology

## 2022-11-05 ENCOUNTER — Ambulatory Visit: Payer: Medicare Other | Admitting: Anesthesiology

## 2022-11-05 ENCOUNTER — Encounter
Admission: RE | Disposition: A | Discharge status: Home / Self Care | Payer: Self-pay | Source: Home / Self Care | Attending: Ophthalmology

## 2022-11-05 ENCOUNTER — Ambulatory Visit
Admission: RE | Admit: 2022-11-05 | Discharge: 2022-11-05 | Disposition: A | Discharge status: Home / Self Care | Payer: Medicare Other | Attending: Ophthalmology | Admitting: Ophthalmology

## 2022-11-05 ENCOUNTER — Encounter: Payer: Self-pay | Admitting: Ophthalmology

## 2022-11-05 ENCOUNTER — Other Ambulatory Visit: Payer: Self-pay

## 2022-11-05 DIAGNOSIS — H2511 Age-related nuclear cataract, right eye: Secondary | ICD-10-CM | POA: Diagnosis not present

## 2022-11-05 DIAGNOSIS — K7460 Unspecified cirrhosis of liver: Secondary | ICD-10-CM | POA: Diagnosis not present

## 2022-11-05 DIAGNOSIS — G4733 Obstructive sleep apnea (adult) (pediatric): Secondary | ICD-10-CM | POA: Insufficient documentation

## 2022-11-05 DIAGNOSIS — K219 Gastro-esophageal reflux disease without esophagitis: Secondary | ICD-10-CM | POA: Diagnosis not present

## 2022-11-05 DIAGNOSIS — I480 Paroxysmal atrial fibrillation: Secondary | ICD-10-CM | POA: Diagnosis not present

## 2022-11-05 DIAGNOSIS — F4312 Post-traumatic stress disorder, chronic: Secondary | ICD-10-CM | POA: Diagnosis not present

## 2022-11-05 DIAGNOSIS — Z7984 Long term (current) use of oral hypoglycemic drugs: Secondary | ICD-10-CM | POA: Diagnosis not present

## 2022-11-05 DIAGNOSIS — F40240 Claustrophobia: Secondary | ICD-10-CM | POA: Insufficient documentation

## 2022-11-05 DIAGNOSIS — M069 Rheumatoid arthritis, unspecified: Secondary | ICD-10-CM | POA: Diagnosis not present

## 2022-11-05 DIAGNOSIS — F418 Other specified anxiety disorders: Secondary | ICD-10-CM | POA: Diagnosis not present

## 2022-11-05 DIAGNOSIS — E1136 Type 2 diabetes mellitus with diabetic cataract: Secondary | ICD-10-CM | POA: Insufficient documentation

## 2022-11-05 DIAGNOSIS — K449 Diaphragmatic hernia without obstruction or gangrene: Secondary | ICD-10-CM | POA: Diagnosis not present

## 2022-11-05 HISTORY — PX: CATARACT EXTRACTION W/PHACO: SHX586

## 2022-11-05 LAB — GLUCOSE, CAPILLARY: Glucose-Capillary: 121 mg/dL — ABNORMAL HIGH (ref 70–99)

## 2022-11-05 SURGERY — PHACOEMULSIFICATION, CATARACT, WITH IOL INSERTION
Anesthesia: Monitor Anesthesia Care | Site: Eye | Laterality: Right

## 2022-11-05 MED ORDER — LIDOCAINE HCL (CARDIAC) PF 100 MG/5ML IV SOSY
PREFILLED_SYRINGE | INTRAVENOUS | Status: DC | PRN
  Administered 2022-11-05: 80 mg via INTRAVENOUS

## 2022-11-05 MED ORDER — LACTATED RINGERS IV SOLN: INTRAVENOUS | Status: DC

## 2022-11-05 MED ORDER — PROPOFOL 10 MG/ML IV BOLUS
INTRAVENOUS | Status: DC | PRN
Start: 2022-11-05 — End: 2022-11-05
  Administered 2022-11-05 (×2): 20 mg via INTRAVENOUS

## 2022-11-05 MED ORDER — MIDAZOLAM HCL 2 MG/2ML IJ SOLN
INTRAMUSCULAR | Status: DC | PRN
  Administered 2022-11-05: 4 mg via INTRAVENOUS

## 2022-11-05 MED ORDER — SIGHTPATH DOSE#1 NA HYALUR & NA CHOND-NA HYALUR IO KIT
PACK | INTRAOCULAR | Status: DC | PRN
  Administered 2022-11-05: 1 via OPHTHALMIC

## 2022-11-05 MED ORDER — FENTANYL CITRATE (PF) 100 MCG/2ML IJ SOLN
INTRAMUSCULAR | Status: DC | PRN
  Administered 2022-11-05: 100 ug via INTRAVENOUS

## 2022-11-05 MED ORDER — SIGHTPATH DOSE#1 BSS IO SOLN
INTRAOCULAR | Status: DC | PRN
  Administered 2022-11-05: 2 mL

## 2022-11-05 MED ORDER — TIMOLOL MALEATE 0.5 % OP SOLN
OPHTHALMIC | Status: DC | PRN
  Administered 2022-11-05: 1 [drp] via OPHTHALMIC

## 2022-11-05 MED ORDER — TETRACAINE HCL 0.5 % OP SOLN
1.0000 [drp] | OPHTHALMIC | Status: DC | PRN
  Administered 2022-11-05 (×3): 1 [drp] via OPHTHALMIC

## 2022-11-05 MED ORDER — DEXMEDETOMIDINE HCL IN NACL 200 MCG/50ML IV SOLN
INTRAVENOUS | Status: DC | PRN
Start: 2022-11-05 — End: 2022-11-05
  Administered 2022-11-05: 20 ug via INTRAVENOUS

## 2022-11-05 MED ORDER — ARMC OPHTHALMIC DILATING DROPS
1.0000 | OPHTHALMIC | Status: DC | PRN
  Administered 2022-11-05 (×3): 1 via OPHTHALMIC

## 2022-11-05 MED ORDER — SIGHTPATH DOSE#1 BSS IO SOLN
INTRAOCULAR | Status: DC | PRN
  Administered 2022-11-05: 66 mL via OPHTHALMIC

## 2022-11-05 MED ORDER — SIGHTPATH DOSE#1 BSS IO SOLN
INTRAOCULAR | Status: DC | PRN
  Administered 2022-11-05: 15 mL via INTRAOCULAR

## 2022-11-05 MED ORDER — CEFUROXIME OPHTHALMIC INJECTION 1 MG/0.1 ML
INJECTION | OPHTHALMIC | Status: DC | PRN
  Administered 2022-11-05: .1 mL via INTRACAMERAL

## 2022-11-05 SURGICAL SUPPLY — 11 items
CANNULA ANT/CHMB 27G (MISCELLANEOUS) IMPLANT
CANNULA ANT/CHMB 27GA (MISCELLANEOUS)
CATARACT SUITE SIGHTPATH (MISCELLANEOUS) ×1
FEE CATARACT SUITE SIGHTPATH (MISCELLANEOUS) ×1 IMPLANT
GLOVE SRG 8 PF TXTR STRL LF DI (GLOVE) ×1 IMPLANT
GLOVE SURG ENC TEXT LTX SZ7.5 (GLOVE) ×1 IMPLANT
GLOVE SURG UNDER POLY LF SZ8 (GLOVE) ×1
LENS IOL TECNIS EYHANCE 18.0 (Intraocular Lens) IMPLANT
NDL FILTER BLUNT 18X1 1/2 (NEEDLE) ×1 IMPLANT
NEEDLE FILTER BLUNT 18X1 1/2 (NEEDLE) ×1
SYR 3ML LL SCALE MARK (SYRINGE) ×1 IMPLANT

## 2022-11-05 NOTE — Transfer of Care (Signed)
Immediate Anesthesia Transfer of Care Note  Patient: Mario [name]  Procedure(s) Performed: CATARACT EXTRACTION PHACO AND INTRAOCULAR LENS PLACEMENT (IOC) RIGHT DIABETIC 9.31 00:39.6 (Right: Eye)  Patient Location: PACU  Anesthesia Type: MAC  Level of Consciousness: awake, alert  and patient cooperative  Airway and Oxygen Therapy: Patient Spontanous Breathing and Patient connected to supplemental oxygen  Post-op Assessment: Post-op Vital signs reviewed, Patient's Cardiovascular Status Stable, Respiratory Function Stable, Patent Airway and No signs of Nausea or vomiting  Post-op Vital Signs: Reviewed and stable  Complications: No notable events documented.

## 2022-11-05 NOTE — H&P (Signed)
Mario [name]   Primary Care Physician:  Gracelyn Nurse, MD Ophthalmologist: Dr. Lockie Mola  Pre-Procedure History & Physical: HPI:  Mario [name] is a 72 y.o. male here for ophthalmic surgery.   Past Medical History:  Diagnosis Date   Anginal pain (HCC)    Arthritis    Back pain    lower   Chronic post-traumatic stress disorder (PTSD)    Cirrhosis (HCC)    Claustrophobia    Claustrophobia    Complication of anesthesia    "does not take much medication."   Depression    Diabetes mellitus without complication (HCC)    type II   Dyspnea    Dysrhythmia    Elevated lipids    Elevated lipids    Environmental and seasonal allergies    Fatty liver    GERD (gastroesophageal reflux disease)    Glaucoma    Hepatitis    type A   HH (hiatus hernia)    Hip pain Right   History of kidney stones    cystoscopy   History of rheumatoid arthritis    Knee pain    right   OSA on CPAP    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 Had tumors on wrist.   CATARACT EXTRACTION W/PHACO Left 10/22/2022   Procedure: CATARACT EXTRACTION PHACO AND INTRAOCULAR LENS PLACEMENT (IOC) LEFT DIABETIC;  Surgeon: Lockie Mola, MD;  Location: [facility];  Service: Ophthalmology;  Laterality: Left;  8.82 0:35.0   COLONOSCOPY WITH PROPOFOL N/A 04/13/2018   Procedure: COLONOSCOPY WITH PROPOFOL;  Surgeon: Toledo, Boykin Nearing, MD;  Location: [facility];  Service: Gastroenterology;  Laterality: N/A;   ESOPHAGOGASTRODUODENOSCOPY (EGD) WITH PROPOFOL N/A 04/13/2018   Procedure: ESOPHAGOGASTRODUODENOSCOPY (EGD) WITH PROPOFOL;  Surgeon: Toledo, Boykin Nearing, MD;  Location: [facility];  Service:  Gastroenterology;  Laterality: N/A;   JOINT REPLACEMENT     RADIOLOGY WITH ANESTHESIA N/A 12/15/2019   Procedure: MRI LUMBAR SPINE WITH AND WITHOUT CONTRAST; THORASIC SPINE WITHOUT CONTRAST;  Surgeon: Radiologist, Medication, MD;  Location: [facility];  Service: Radiology;  Laterality: N/A;   REPLACEMENT TOTAL KNEE Left    TOTAL HIP ARTHROPLASTY Right 01/26/2018   Procedure: TOTAL HIP ARTHROPLASTY ANTERIOR APPROACH;  Surgeon: Kennedy Bucker, MD;  Location: [facility];  Service: Orthopedics;  Laterality: Right;   TOTAL HIP REVISION Right 11/02/2018   Procedure: TOTAL HIP REVISION WITH EXCISION OF HETEROTOPIC OSSIFICATION;  Surgeon: Kennedy Bucker, MD;  Location: [facility];  Service: Orthopedics;  Laterality: Right;    Prior to Admission medications   Medication Sig Start Date End Date Taking? Authorizing Provider  Camphor-Eucalyptus-Menthol (VICKS VAPORUB EX) Place 1 application into both nostrils at bedtime as needed (congestion).    Yes [provider]  diltiazem (TIAZAC) 120 MG 24 hr capsule Take 120 mg by mouth daily.   Yes [provider]  ELIQUIS 5 MG TABS tablet Take 5 mg by mouth 2 (two) times daily. 08/27/21  Yes [provider]  flecainide (TAMBOCOR) 100 MG tablet Take 100 mg by mouth 2 (two) times daily. 09/05/21  Yes [provider]  gabapentin (NEURONTIN) 300 MG capsule Take 300 mg by mouth 3 (three) times daily.   Yes [provider]  glimepiride (AMARYL) 4 MG tablet Take 4 mg by mouth daily with breakfast.    Yes [provider]  losartan (COZAAR) 25 MG tablet Take 25 mg by mouth daily. 08/06/21  Yes [provider]  meloxicam (MOBIC) 15 MG tablet Take 15 mg by mouth daily as needed for pain.   Yes [provider]  metFORMIN (GLUCOPHAGE) 1000 MG tablet Take 1,000 mg by mouth 2 (two) times daily with a meal.   Yes [provider]  pravastatin (PRAVACHOL) 40 MG tablet Take 40 mg by mouth daily.   Yes [provider]  tiZANidine (ZANAFLEX) 4 MG tablet Take 4 mg by mouth every 8 (eight) hours as needed for muscle spasms.  12/02/19  Yes [provider]  traMADol (ULTRAM) 50 MG tablet Take 50 mg by mouth every 6 (six) hours as needed (per patient).   Yes [provider]  traZODone (DESYREL) 100 MG tablet Take 100 mg by mouth at bedtime.   Yes [provider]  Ascorbic Acid (VITAMIN C) 1000 MG tablet Take 1,000 mg by mouth daily. Patient not taking: Reported on 10/17/2022    [provider]  BAYER ASPIRIN EC LOW DOSE 81 MG EC tablet Take 81 mg by mouth daily. Patient not taking: Reported on 10/17/2022 03/07/19   [provider]  Cholecalciferol (DIALYVITE VITAMIN D 5000) 125 MCG (5000 UT) capsule Take 5,000 Units by mouth daily. Patient not taking: Reported on 01/31/2020    [provider]  diazepam (VALIUM) 5 MG tablet Take 1 tablet (5 mg total) by mouth once as needed for up to 1 dose for anxiety (prior to MRI). Patient not taking: Reported on 11/24/2019 10/31/19   Edward Jolly, MD  Magnesium 400 MG CAPS Take 400 mg by mouth daily.  Patient not taking: Reported on 10/17/2022    [provider]  mupirocin ointment (BACTROBAN) 2 % Apply 1 Application topically daily. Qd to excision site Patient not taking: Reported on 10/17/2022 11/19/21   Deirdre Evener, MD  Oxymetazoline HCl (AFRIN 12 HOUR NA) Place 1 spray into the nose daily as needed (congestion). Patient not taking: Reported on 01/31/2020    [provider]  pantoprazole (PROTONIX) 40 MG tablet Take 40 mg by mouth daily as needed for heartburn. Patient not taking: Reported on 01/05/2020 08/11/19   [provider]  PEPPERMINT OIL PO Take 500 mg by mouth at bedtime. Patient not taking: Reported on 10/17/2022    [provider]  vitamin B-12 (CYANOCOBALAMIN) 1000 MCG tablet Take 1,000 mcg by mouth daily. Patient not taking: Reported on 10/17/2022    [provider]  zinc gluconate 50 MG tablet Take 50 mg by mouth daily. Patient not taking: Reported on 10/17/2022    [provider]    Allergies as of 10/28/2022 - Review Complete 10/22/2022  Allergen Reaction Noted   Levaquin [levofloxacin] Itching 01/12/2018    History reviewed. No pertinent family history.  Social History   Socioeconomic History   Marital status: Married    Spouse name: Not on file   Number of children: Not on file   Years of education: Not on file   Highest education level: Not on file  Occupational History   Not on file  Tobacco Use   Smoking status: Never   Smokeless tobacco: Never  Vaping Use   Vaping  status: Never Used  Substance and Sexual Activity   Alcohol use: Not Currently    Comment: rarely    Drug use: Never   Sexual activity: Not Currently  Other Topics Concern   Not on file  Social History Narrative   Patient has extremely high anxiety that can escalate to panic attacks.    Social Determinants of Health   Financial Resource Strain: Not on file  Food Insecurity: Not on file  Transportation Needs: Not on file  Physical Activity: Not on file  Stress: Not on file  Social Connections: Unknown (07/23/2021)   [name]   Social Network    Social Network: Not on file  Intimate Partner Violence: Unknown (06/14/2021)   Received from [facility]   HITS    Physically Hurt: Not on file    Insult or Talk Down To: Not on file    Threaten Physical Harm: Not on file    Scream or Curse: Not on file    Review of Systems: See HPI, otherwise negative ROS  Physical Exam: BP 138/77   Pulse 68   Temp 97.8 F (36.6 C) (Temporal)   Resp 18   Ht 5\' 9"  (1.753 m)   Wt 123.8 kg   SpO2 97%   BMI 40.32 kg/m  General:   Alert,  pleasant and cooperative in NAD Head:  Normocephalic and atraumatic. Lungs:  Clear to auscultation.    Heart:  Regular rate and rhythm.    Impression/Plan: Nadir Kenaston is here for ophthalmic surgery.  Risks, benefits, limitations, and alternatives regarding ophthalmic surgery have been reviewed with the patient.  Questions have been answered.  All parties agreeable.   Lockie Mola, MD  11/05/2022, 12:07 PM

## 2022-11-05 NOTE — Op Note (Signed)
LOCATION:  [facility]   PREOPERATIVE DIAGNOSIS:    Nuclear sclerotic cataract right eye. H25.11   POSTOPERATIVE DIAGNOSIS:  Nuclear sclerotic cataract right eye.     PROCEDURE:  Phacoemusification with posterior chamber intraocular lens placement of the right eye   ULTRASOUND TIME: Procedure(s): CATARACT EXTRACTION PHACO AND INTRAOCULAR LENS PLACEMENT (IOC) RIGHT DIABETIC 9.31 00:39.6 (Right)  LENS:   Implant Name Type Inv. Item Serial No. Manufacturer Lot No. LRB No. Used Action  LENS IOL TECNIS EYHANCE 18.0 - [id number] Intraocular Lens LENS IOL TECNIS EYHANCE 18.0 [id number] [employer]  Right 1 Implanted         SURGEON:  Deirdre Evener, MD   ANESTHESIA:  Topical with tetracaine drops and 2% Xylocaine jelly, augmented with 1% preservative-free intracameral lidocaine.    COMPLICATIONS:  None.   DESCRIPTION OF PROCEDURE:  The patient was identified in the holding room and transported to the operating room and placed in the supine position under the operating microscope.  The right eye was identified as the operative eye and it was prepped and draped in the usual sterile ophthalmic fashion.   A 1 millimeter clear-corneal paracentesis was made at the 12:00 position.  0.5 ml of preservative-free 1% lidocaine was injected into the anterior chamber. The anterior chamber was filled with Viscoat viscoelastic.  A 2.4 millimeter keratome was used to make a near-clear corneal incision at the 9:00 position.  A curvilinear capsulorrhexis was made with a cystotome and capsulorrhexis forceps.  Balanced salt solution was used to hydrodissect and hydrodelineate the nucleus.   Phacoemulsification was then used in stop and chop fashion to remove the lens nucleus and epinucleus.  The remaining cortex was then removed using the irrigation and aspiration handpiece. Provisc was then placed into the capsular bag to distend it for lens placement.  A lens was then injected into the capsular  bag.  The remaining viscoelastic was aspirated.   Wounds were hydrated with balanced salt solution.  The anterior chamber was inflated to a physiologic pressure with balanced salt solution.  No wound leaks were noted. Cefuroxime 0.1 ml of a 10mg /ml solution was injected into the anterior chamber for a dose of 1 mg of intracameral antibiotic at the completion of the case.   Timolol drops were applied to the eye.  The patient was taken to the recovery room in stable condition without complications of anesthesia or surgery.   Mario [name] 11/05/2022, 1:20 PM

## 2022-11-05 NOTE — Anesthesia Postprocedure Evaluation (Signed)
Anesthesia Post Note  Patient: Mario [name]  Procedure(s) Performed: CATARACT EXTRACTION PHACO AND INTRAOCULAR LENS PLACEMENT (IOC) RIGHT DIABETIC 9.31 00:39.6 (Right: Eye)  Patient location during evaluation: PACU Anesthesia Type: MAC Level of consciousness: awake and alert Pain management: pain level controlled Vital Signs Assessment: post-procedure vital signs reviewed and stable Respiratory status: spontaneous breathing, nonlabored ventilation, respiratory function stable and patient connected to nasal cannula oxygen Cardiovascular status: stable and blood pressure returned to baseline Postop Assessment: no apparent nausea or vomiting Anesthetic complications: no   No notable events documented.   Last Vitals:  Vitals:   11/05/22 1154 11/05/22 1322  BP: 138/77 (!) 103/58  Pulse: 68 63  Resp: 18 16  Temp: 36.6 C (!) 36.1 C  SpO2: 97% 94%    Last Pain:  Vitals:   11/05/22 1322  TempSrc:   PainSc: Asleep                 Hosey Burmester C Ikaika Showers

## 2022-11-06 ENCOUNTER — Encounter: Payer: Self-pay | Admitting: Ophthalmology

## 2022-11-06 NOTE — Progress Notes (Signed)
Referring Physician:  Gracelyn Nurse, MD [address] [place],  [place] [address]  Primary Physician:  Gracelyn Nurse, MD  History of Present Illness: 11/11/2022 [name] is here today with a chief complaint of back and leg pain.  He has significant low back pain as well as bilateral hip and buttock pain.  He has been having pain for at least 3 years.  I saw him back in October 2020 with similar symptoms, though his leg symptoms are worse than they previously were.  Bending, carrying objects, driving, exercise, getting in and out of his car makes his pain worse.  Multiple other activities also make his symptoms worse.  Nothing really helps.  He has constant pain.  This has been life altering for him.  He tried and failed conservative management including physical therapy and injections.  Bowel/Bladder Dysfunction: none  Conservative measures:   Physical therapy: 07/24/2022-08/26/2022 done at [facility]  was dis charged no relief  Multimodal medical therapy including regular antiinflammatories: Tramadol, Meloxicam, Gabapentin  Injections: has participated in epidural steroid injections performed at [facility] pain clinic   08/03/2019 L3-4 IL ESI 06/13/2019 L3-4 IL ESI 05/23/2019 Bilateral SI joint injection  Past Surgery: yes Lumbar fusion ( 2000)   Mario [name] has no symptoms of cervical myelopathy.  The symptoms are causing a significant impact on the patient's life.   I have utilized the care everywhere function in epic to review the outside records available from external health systems.  12/28/2018 [name] is here today with a chief complaint of neck pain, thoracic back pain, low back pain (left worse than right), left groin pain, and numbness in the left buttock. He also reports right hip pain, right knee pain.  He had back surgery initially in 2000, and had excellent response to surgery. Over the last 2 to 3 years, he has been having worsening  left-sided and midline back pain. He has little radicular pain. He is also having hip pain and knee pain. He feels like pain into his left buttock at times. His pain is as bad as 5 out of 10 and is made worse by walking, standing, sitting, or activity. Nothing really helps. He has not tried medications because he does not wish to take any medications. He is also recently diagnosed with nonalcoholic liver disease, so is very hesitant to try medications.  He describes difficulty with the emotional component of his chronic pain.  Conservative measures:  Physical therapy: has not tried Multimodal medical therapy including regular antiinflammatories: none Injections: has not tried epidural steroid injections recently (about 3 years ago)  Past Surgery: prior fusion in [place] around 2000  Mario [name] has no symptoms of cervical myelopathy.  The symptoms are causing a significant impact on the patient's life.   Review of Systems:  A 10 point review of systems is negative, except for the pertinent positives and negatives detailed in the HPI.  Past Medical History: Past Medical History:  Diagnosis Date   Anginal pain (HCC)    Arthritis    Back pain    lower   Chronic post-traumatic stress disorder (PTSD)    Cirrhosis (HCC)    Claustrophobia    Claustrophobia    Complication of anesthesia    "does not take much medication."   Depression    Diabetes mellitus without complication (HCC)    type II   Dyspnea    Dysrhythmia    Elevated lipids  Elevated lipids    Environmental and seasonal allergies    Fatty liver    GERD (gastroesophageal reflux disease)    Glaucoma    Hepatitis    type A   HH (hiatus hernia)    Hip pain Right   History of kidney stones    cystoscopy   History of rheumatoid arthritis    Knee pain    right   OSA on CPAP    Panic attack due to post traumatic stress disorder (PTSD)    Panic attacks    Paroxysmal atrial fibrillation (HCC)    Pneumonia    as  a child   Sleep apnea    CPAP   Vertigo     Past Surgical History: Past Surgical History:  Procedure Laterality Date   BACK SURGERY     L4-L5 fusion   Bone graft autograft  1954   hip to wrist bilateral. Had tumors on wrist.   CATARACT EXTRACTION W/PHACO Left 10/22/2022   Procedure: CATARACT EXTRACTION PHACO AND INTRAOCULAR LENS PLACEMENT (IOC) LEFT DIABETIC;  Surgeon: Lockie Mola, MD;  Location: [facility];  Service: Ophthalmology;  Laterality: Left;  8.82 0:35.0   CATARACT EXTRACTION W/PHACO Right 11/05/2022   Procedure: CATARACT EXTRACTION PHACO AND INTRAOCULAR LENS PLACEMENT (IOC) RIGHT DIABETIC 9.31 00:39.6;  Surgeon: Lockie Mola, MD;  Location: [facility];  Service: Ophthalmology;  Laterality: Right;   COLONOSCOPY WITH PROPOFOL N/A 04/13/2018   Procedure: COLONOSCOPY WITH PROPOFOL;  Surgeon: Toledo, Boykin Nearing, MD;  Location: [facility];  Service: Gastroenterology;  Laterality: N/A;   ESOPHAGOGASTRODUODENOSCOPY (EGD) WITH PROPOFOL N/A 04/13/2018   Procedure: ESOPHAGOGASTRODUODENOSCOPY (EGD) WITH PROPOFOL;  Surgeon: Toledo, Boykin Nearing, MD;  Location: [facility];  Service: Gastroenterology;  Laterality: N/A;   JOINT REPLACEMENT     RADIOLOGY WITH ANESTHESIA N/A 12/15/2019   Procedure: MRI LUMBAR SPINE WITH AND WITHOUT CONTRAST; THORASIC SPINE WITHOUT CONTRAST;  Surgeon: Radiologist, Medication, MD;  Location: [facility];  Service: Radiology;  Laterality: N/A;   REPLACEMENT TOTAL KNEE Left    TOTAL HIP ARTHROPLASTY Right 01/26/2018   Procedure: TOTAL HIP ARTHROPLASTY ANTERIOR APPROACH;  Surgeon: Kennedy Bucker, MD;  Location: [facility];  Service: Orthopedics;  Laterality: Right;   TOTAL HIP REVISION Right 11/02/2018   Procedure: TOTAL HIP REVISION WITH EXCISION OF HETEROTOPIC OSSIFICATION;  Surgeon: Kennedy Bucker, MD;  Location: [facility];  Service: Orthopedics;  Laterality: Right;    Allergies: Allergies as of 11/11/2022 - Review Complete 11/11/2022   Allergen Reaction Noted   Levaquin [levofloxacin] Itching 01/12/2018    Medications:  Current Outpatient Medications:    Camphor-Eucalyptus-Menthol (VICKS VAPORUB EX), Place 1 application into both nostrils at bedtime as needed (congestion). , Disp: , Rfl:    diltiazem (TIAZAC) 120 MG 24 hr capsule, Take 120 mg by mouth daily., Disp: , Rfl:    ELIQUIS 5 MG TABS tablet, Take 5 mg by mouth 2 (two) times daily., Disp: , Rfl:    flecainide (TAMBOCOR) 100 MG tablet, Take 100 mg by mouth 2 (two) times daily., Disp: , Rfl:    gabapentin (NEURONTIN) 300 MG capsule, Take 300 mg by mouth 3 (three) times daily., Disp: , Rfl:    glimepiride (AMARYL) 4 MG tablet, Take 4 mg by mouth daily with breakfast. , Disp: , Rfl:    losartan (COZAAR) 25 MG tablet, Take 25 mg by mouth daily., Disp: , Rfl:    Magnesium 400 MG CAPS, Take 400 mg by mouth daily., Disp: , Rfl:    meloxicam (  MOBIC) 15 MG tablet, Take 15 mg by mouth daily as needed for pain., Disp: , Rfl:    metFORMIN (GLUCOPHAGE) 1000 MG tablet, Take 1,000 mg by mouth 2 (two) times daily with a meal., Disp: , Rfl:    pravastatin (PRAVACHOL) 40 MG tablet, Take 40 mg by mouth daily., Disp: , Rfl:    tiZANidine (ZANAFLEX) 4 MG tablet, Take 4 mg by mouth every 8 (eight) hours as needed for muscle spasms. , Disp: , Rfl:    traMADol (ULTRAM) 50 MG tablet, Take 50 mg by mouth every 6 (six) hours as needed (per patient)., Disp: , Rfl:    traZODone (DESYREL) 100 MG tablet, Take 100 mg by mouth at bedtime., Disp: , Rfl:   Social History: Social History   Tobacco Use   Smoking status: Never   Smokeless tobacco: Never  Vaping Use   Vaping status: Never Used  Substance Use Topics   Alcohol use: Not Currently    Comment: rarely    Drug use: Never    Family Medical History: No family history on file.  Physical Examination: Vitals:   11/11/22 1109  BP: 130/72    General: Patient is in no apparent distress. Attention to examination is  appropriate.  Neck:   Supple.  Full range of motion.  Respiratory: Patient is breathing without any difficulty.   NEUROLOGICAL:     Awake, alert, oriented to person, place, and time.  Speech is clear and fluent.   Cranial Nerves: Pupils equal round and reactive to light.  Facial tone is symmetric.  Facial sensation is symmetric. Shoulder shrug is symmetric. Tongue protrusion is midline.  There is no pronator drift.  Strength: Side Biceps Triceps Deltoid Interossei Grip Wrist Ext. Wrist Flex.  R 5 5 5 5 5 5 5   L 5 5 5 5 5 5 5    Side Iliopsoas Quads Hamstring PF DF EHL  R 5 5 5 5 5 5   L 5 5 5 5 5 5    Reflexes are 1+ and symmetric at the biceps, triceps, brachioradialis, patella and achilles.   Hoffman's is absent.   Bilateral upper and lower extremity sensation is intact to light touch.    No evidence of dysmetria noted.  Gait is antalgic.     Medical Decision Making  Imaging: MRI L spine 10/24/2022 IMPRESSION: 1. Transitional anatomy with numbering as per prior lumbar CT, designating chronic L5-S1 fusion, lumbarized S1-S2. Correlation with radiographs is recommended prior to any operative intervention.   2. Previous L5-S1 decompression and fusion with solid arthrodesis. 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 Subsequent adjacent/intervening segment disease at L4-L5 with moderate to severe multifactorial spinal stenosis that appears somewhat progressed since the 2021 MRI.   3. Mild chronic multifactorial spinal stenosis at both L2-L3 and L3-L4 is stable. Small disc herniation at L1-L2 with mild spinal stenosis is stable.     Electronically Signed   By: Odessa Fleming M.D.   On: 11/08/2022 05:38  I have personally reviewed the images and agree with the above interpretation.  Assessment and Plan: Mario [name] is a pleasant 72 y.o. 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  This contributes to back pain with substantial bilateral radiculopathy as well.  He has tried and failed conservative management including physical therapy, injections, medications.  At this point, it is appropriate to consider surgical intervention with an  L4-5 lateral lumbar interbody fusion with percutaneous fixation.   We have set a goal for him of 255 pounds.  When he hits his goal weight, we will proceed with surgery.    Thank you for involving me in the care of this patient.      Thayden Lemire K. Myer Haff MD, [facility] Neurosurgery

## 2022-11-11 ENCOUNTER — Ambulatory Visit (INDEPENDENT_AMBULATORY_CARE_PROVIDER_SITE_OTHER): Payer: Medicare Other | Admitting: Neurosurgery

## 2022-11-11 ENCOUNTER — Encounter: Payer: Self-pay | Admitting: Neurosurgery

## 2022-11-11 VITALS — BP 130/72 | Ht 69.0 in | Wt 273.0 lb

## 2022-11-11 DIAGNOSIS — G8929 Other chronic pain: Secondary | ICD-10-CM | POA: Diagnosis not present

## 2022-11-11 DIAGNOSIS — M5116 Intervertebral disc disorders with radiculopathy, lumbar region: Secondary | ICD-10-CM

## 2022-11-11 DIAGNOSIS — Z981 Arthrodesis status: Secondary | ICD-10-CM

## 2023-02-04 ENCOUNTER — Ambulatory Visit: Payer: Medicare Other | Admitting: Dermatology

## 2023-02-04 ENCOUNTER — Encounter: Payer: Self-pay | Admitting: Dermatology

## 2023-02-04 DIAGNOSIS — L820 Inflamed seborrheic keratosis: Secondary | ICD-10-CM | POA: Diagnosis not present

## 2023-02-04 DIAGNOSIS — L570 Actinic keratosis: Secondary | ICD-10-CM | POA: Diagnosis not present

## 2023-02-04 DIAGNOSIS — L821 Other seborrheic keratosis: Secondary | ICD-10-CM

## 2023-02-04 DIAGNOSIS — W908XXA Exposure to other nonionizing radiation, initial encounter: Secondary | ICD-10-CM | POA: Diagnosis not present

## 2023-02-04 NOTE — Progress Notes (Signed)
Follow-Up Visit   Subjective  Mario [name] is a 72 y.o. male who presents for the following: Spots on face. Very itchy when he is hot and sweaty. Uses Triamcinolone 0.5% cream, helps. Dark areas, raised, rough.   The patient has spots, moles and lesions to be evaluated, some may be new or changing and the patient may have concern these could be cancer.    The following portions of the chart were reviewed this encounter and updated as appropriate: medications, allergies, medical history  Review of Systems:  No other skin or systemic complaints except as noted in HPI or Assessment and Plan.  Objective  Well appearing patient in no apparent distress; mood and affect are within normal limits.  A focused examination was performed of the following areas: Face   Relevant physical exam findings are noted in the Assessment and Plan.  B/L temples, right forehead near hair line zygomas (10) Erythematous keratotic or waxy stuck-on papule or plaque.  right zygoma x1 Erythematous thin papules/macules with gritty scale.     Assessment & Plan   Inflamed seborrheic keratosis (10) B/L temples, right forehead near hair line zygomas  Symptomatic, irritating, patient would like treated.  Destruction of lesion - B/L temples, right forehead near hair line zygomas (10) Complexity: simple   Destruction method: cryotherapy   Informed consent: discussed and consent obtained   Timeout:  patient name, date of birth, surgical site, and procedure verified Lesion destroyed using liquid nitrogen: Yes   Region frozen until ice ball extended beyond lesion: Yes   Cryotherapy cycles:  1 (1 or 2) Outcome: patient tolerated procedure well with no complications   Post-procedure details: wound care instructions given    AK (actinic keratosis) right zygoma x1  Actinic keratoses are precancerous spots that appear secondary to cumulative UV radiation exposure/sun exposure over time. They are chronic with  expected duration over 1 year. A portion of actinic keratoses will progress to squamous cell carcinoma of the skin. It is not possible to reliably predict which spots will progress to skin cancer and so treatment is recommended to prevent development of skin cancer.  Recommend daily broad spectrum sunscreen SPF 30+ to sun-exposed areas, reapply every 2 hours as needed.  Recommend staying in the shade or wearing long sleeves, sun glasses (UVA+UVB protection) and wide brim hats (4-inch brim around the entire circumference of the hat). Call for new or changing lesions.  Destruction of lesion - right zygoma x1 Complexity: simple   Destruction method: cryotherapy   Informed consent: discussed and consent obtained   Timeout:  patient name, date of birth, surgical site, and procedure verified Lesion destroyed using liquid nitrogen: Yes   Region frozen until ice ball extended beyond lesion: Yes   Cryo cycles: 1 or 2. Outcome: patient tolerated procedure well with no complications   Post-procedure details: wound care instructions given   Additional details:  Prior to procedure, discussed risks of blister formation, small wound, skin dyspigmentation, or rare scar following cryotherapy. Recommend Vaseline ointment to treated areas while healing.   Seborrheic keratoses   SEBORRHEIC KERATOSIS - Stuck-on, waxy, tan-brown papules and/or plaques at face - Benign-appearing - Discussed benign etiology and prognosis. - Observe - Call for any changes    Return if symptoms worsen or fail to improve.  I, Lawson Radar, CMA, am acting as scribe for Elie Goody, MD.   Documentation: I have reviewed the above documentation for accuracy and completeness, and I agree with the above.  Elie Goody,  MD

## 2023-02-04 NOTE — Patient Instructions (Signed)
Cryotherapy Aftercare  Wash gently with soap and water everyday.   Apply Vaseline Jelly daily until healed.    Recommend daily broad spectrum sunscreen SPF 30+ to sun-exposed areas, reapply every 2 hours as needed. Call for new or changing lesions.  Staying in the shade or wearing long sleeves, sun glasses (UVA+UVB protection) and wide brim hats (4-inch brim around the entire circumference of the hat) are also recommended for sun protection.    Seborrheic Keratosis  What causes seborrheic keratoses? Seborrheic keratoses are harmless, common skin growths that first appear during adult life.  As time goes by, more growths appear.  Some people may develop a large number of them.  Seborrheic keratoses appear on both covered and uncovered body parts.  They are not caused by sunlight.  The tendency to develop seborrheic keratoses can be inherited.  They vary in color from skin-colored to gray, brown, or even black.  They can be either smooth or have a rough, warty surface.   Seborrheic keratoses are superficial and look as if they were stuck on the skin.  Under the microscope this type of keratosis looks like layers upon layers of skin.  That is why at times the top layer may seem to fall off, but the rest of the growth remains and re-grows.    Treatment Seborrheic keratoses do not need to be treated, but can easily be removed in the office.  Seborrheic keratoses often cause symptoms when they rub on clothing or jewelry.  Lesions can be in the way of shaving.  If they become inflamed, they can cause itching, soreness, or burning.  Removal of a seborrheic keratosis can be accomplished by freezing, burning, or surgery. If any spot bleeds, scabs, or grows rapidly, please return to have it checked, as these can be an indication of a skin cancer.  Due to recent changes in healthcare laws, you may see results of your pathology and/or laboratory studies on MyChart before the doctors have had a chance to  review them. We understand that in some cases there may be results that are confusing or concerning to you. Please understand that not all results are received at the same time and often the doctors may need to interpret multiple results in order to provide you with the best plan of care or course of treatment. Therefore, we ask that you please give [name] 2 business days to thoroughly review all your results before contacting the office for clarification. Should we see a critical lab result, you will be contacted sooner.   If You Need Anything After Your Visit  If you have any questions or concerns for your doctor, please call our main line at [phone] and press option 4 to reach your doctor's medical assistant. If no one answers, please leave a voicemail as directed and we will return your call as soon as possible. Messages left after 4 pm will be answered the following business day.   You may also send [name] a message via MyChart. We typically respond to MyChart messages within 1-2 business days.  For prescription refills, please ask your pharmacy to contact our office. Our fax number is [phone].  If you have an urgent issue when the clinic is closed that cannot wait until the next business day, you can page your doctor at the number below.    Please note that while we do our best to be available for urgent issues outside of office hours, we are not available 24/7.   If  you have an urgent issue and are unable to reach [name], you may choose to seek medical care at your doctor's office, retail clinic, urgent care center, or emergency room.  If you have a medical emergency, please immediately call 911 or go to the emergency department.  Pager Numbers  - Dr. Gwen Pounds: [phone]  - Dr. Roseanne Reno: [phone]  - Dr. Katrinka Blazing: [phone]   In the event of inclement weather, please call our main line at [phone] for an update on the status of any delays or closures.  Dermatology  Medication Tips: Please keep the boxes that topical medications come in in order to help keep track of the instructions about where and how to use these. Pharmacies typically print the medication instructions only on the boxes and not directly on the medication tubes.   If your medication is too expensive, please contact our office at [phone] option 4 or send [name] a message through MyChart.   We are unable to tell what your co-pay for medications will be in advance as this is different depending on your insurance coverage. However, we may be able to find a substitute medication at lower cost or fill out paperwork to get insurance to cover a needed medication.   If a prior authorization is required to get your medication covered by your insurance company, please allow [name] 1-2 business days to complete this process.  Drug prices often vary depending on where the prescription is filled and some pharmacies may offer cheaper prices.  The website www.goodrx.com contains coupons for medications through different pharmacies. The prices here do not account for what the cost may be with help from insurance (it may be cheaper with your insurance), but the website can give you the price if you did not use any insurance.  - You can print the associated coupon and take it with your prescription to the pharmacy.  - You may also stop by our office during regular business hours and pick up a GoodRx coupon card.  - If you need your prescription sent electronically to a different pharmacy, notify our office through [facility] or by phone at [phone] option 4.     Si Usted Necesita Algo Despus de Su Visita  Tambin puede enviarnos un mensaje a travs de Clinical cytogeneticist. Por lo general respondemos a los mensajes de MyChart en el transcurso de 1 a 2 das hbiles.  Para renovar recetas, por favor pida a su farmacia que se ponga en contacto con nuestra oficina. Annie Sable de fax es [place] [phone].  Si  tiene un asunto urgente cuando la clnica est cerrada y que no puede esperar hasta el siguiente da hbil, puede llamar/localizar a su doctor(a) al nmero que aparece a continuacin.   Por favor, tenga en cuenta que aunque hacemos todo lo posible para estar disponibles para asuntos urgentes fuera del horario de [place], no estamos disponibles las 24 horas del da, los 7 [address]  [address] de la [place].   Si tiene un problema urgente y no puede comunicarse con nosotros, puede optar por buscar atencin mdica  en el consultorio de su doctor(a), en una clnica privada, en un centro de atencin urgente o en una sala de emergencias.  Si tiene Engineer, drilling, por favor llame inmediatamente al 911 o vaya a la sala de emergencias.  Nmeros de bper  - Dr. Gwen Pounds: [phone]  - Dra. Roseanne Reno: [phone]  - Dr. Katrinka Blazing: [phone]   En caso de inclemencias del tiempo, por favor llame a nuestra lnea  principal al [phone] para una actualizacin sobre el [place] de cualquier retraso o cierre.  Consejos para la medicacin en dermatologa: Por favor, guarde las cajas en las que vienen los medicamentos de uso tpico para ayudarle a seguir las instrucciones sobre dnde y cmo usarlos. Las farmacias generalmente imprimen las instrucciones del medicamento slo en las cajas y no directamente en los tubos del [place].   Si su medicamento es muy caro, por favor, pngase en contacto con Rolm [place]ala llamando al [phone] y presione la opcin 4 o envenos un mensaje a travs de Clinical cytogeneticist.   No podemos decirle cul ser su copago por los medicamentos por adelantado ya que esto es diferente dependiendo de la cobertura de su seguro. Sin embargo, es posible que podamos encontrar un medicamento sustituto a Audiological scientist un formulario para que el seguro cubra el medicamento que se considera necesario.   Si se requiere una autorizacin previa para que su compaa de seguros [place] su medicamento, por  favor permtanos de 1 a 2 das hbiles para completar [address]  Los precios de los medicamentos varan con frecuencia dependiendo del Environmental consultant de dnde se surte la receta y alguna farmacias pueden ofrecer precios ms baratos.  El sitio web www.goodrx.com tiene cupones para medicamentos de Health and safety inspector. Los precios aqu no tienen en cuenta lo que podra costar con la ayuda del seguro (puede ser ms barato con su seguro), pero el sitio web puede darle el precio si no utiliz Tourist information centre manager.  - Puede imprimir el cupn correspondiente y llevarlo con su receta a la farmacia.  - Tambin puede pasar por nuestra oficina durante el horario de atencin regular y Education officer, museum una tarjeta de cupones de GoodRx.  - Si necesita que su receta se enve electrnicamente a una farmacia diferente, informe a nuestra oficina a travs de MyChart de  o por telfono llamando al [phone] y presione la opcin 4.

## 2023-04-23 ENCOUNTER — Emergency Department: Payer: Medicare Other

## 2023-04-23 ENCOUNTER — Inpatient Hospital Stay
Admission: EM | Admit: 2023-04-23 | Discharge: 2023-04-27 | DRG: 872 | Disposition: A | Discharge status: Home / Self Care | Payer: Medicare Other | Attending: Student | Admitting: Student

## 2023-04-23 ENCOUNTER — Other Ambulatory Visit: Payer: Self-pay

## 2023-04-23 DIAGNOSIS — K760 Fatty (change of) liver, not elsewhere classified: Secondary | ICD-10-CM | POA: Diagnosis present

## 2023-04-23 DIAGNOSIS — R531 Weakness: Secondary | ICD-10-CM | POA: Diagnosis present

## 2023-04-23 DIAGNOSIS — E278 Other specified disorders of adrenal gland: Secondary | ICD-10-CM | POA: Diagnosis present

## 2023-04-23 DIAGNOSIS — F40240 Claustrophobia: Secondary | ICD-10-CM | POA: Diagnosis present

## 2023-04-23 DIAGNOSIS — K7460 Unspecified cirrhosis of liver: Secondary | ICD-10-CM | POA: Diagnosis present

## 2023-04-23 DIAGNOSIS — K219 Gastro-esophageal reflux disease without esophagitis: Secondary | ICD-10-CM | POA: Diagnosis present

## 2023-04-23 DIAGNOSIS — Z87442 Personal history of urinary calculi: Secondary | ICD-10-CM

## 2023-04-23 DIAGNOSIS — M069 Rheumatoid arthritis, unspecified: Secondary | ICD-10-CM | POA: Diagnosis present

## 2023-04-23 DIAGNOSIS — Z1611 Resistance to penicillins: Secondary | ICD-10-CM | POA: Diagnosis present

## 2023-04-23 DIAGNOSIS — F32A Depression, unspecified: Secondary | ICD-10-CM | POA: Diagnosis present

## 2023-04-23 DIAGNOSIS — N390 Urinary tract infection, site not specified: Principal | ICD-10-CM

## 2023-04-23 DIAGNOSIS — Z79899 Other long term (current) drug therapy: Secondary | ICD-10-CM

## 2023-04-23 DIAGNOSIS — Z96641 Presence of right artificial hip joint: Secondary | ICD-10-CM | POA: Diagnosis present

## 2023-04-23 DIAGNOSIS — E871 Hypo-osmolality and hyponatremia: Secondary | ICD-10-CM | POA: Diagnosis present

## 2023-04-23 DIAGNOSIS — E119 Type 2 diabetes mellitus without complications: Secondary | ICD-10-CM | POA: Diagnosis present

## 2023-04-23 DIAGNOSIS — Z7984 Long term (current) use of oral hypoglycemic drugs: Secondary | ICD-10-CM | POA: Diagnosis not present

## 2023-04-23 DIAGNOSIS — Z6841 Body Mass Index (BMI) 40.0 and over, adult: Secondary | ICD-10-CM

## 2023-04-23 DIAGNOSIS — Z1152 Encounter for screening for COVID-19: Secondary | ICD-10-CM

## 2023-04-23 DIAGNOSIS — Z7901 Long term (current) use of anticoagulants: Secondary | ICD-10-CM | POA: Diagnosis not present

## 2023-04-23 DIAGNOSIS — I10 Essential (primary) hypertension: Secondary | ICD-10-CM | POA: Diagnosis present

## 2023-04-23 DIAGNOSIS — R6510 Systemic inflammatory response syndrome (SIRS) of non-infectious origin without acute organ dysfunction: Secondary | ICD-10-CM

## 2023-04-23 DIAGNOSIS — I480 Paroxysmal atrial fibrillation: Secondary | ICD-10-CM | POA: Diagnosis present

## 2023-04-23 DIAGNOSIS — A419 Sepsis, unspecified organism: Principal | ICD-10-CM | POA: Diagnosis present

## 2023-04-23 DIAGNOSIS — F4312 Post-traumatic stress disorder, chronic: Secondary | ICD-10-CM | POA: Diagnosis present

## 2023-04-23 DIAGNOSIS — B9620 Unspecified Escherichia coli [E. coli] as the cause of diseases classified elsewhere: Secondary | ICD-10-CM | POA: Diagnosis present

## 2023-04-23 DIAGNOSIS — F410 Panic disorder [episodic paroxysmal anxiety] without agoraphobia: Secondary | ICD-10-CM | POA: Diagnosis present

## 2023-04-23 DIAGNOSIS — Z96652 Presence of left artificial knee joint: Secondary | ICD-10-CM | POA: Diagnosis present

## 2023-04-23 DIAGNOSIS — H409 Unspecified glaucoma: Secondary | ICD-10-CM | POA: Diagnosis present

## 2023-04-23 DIAGNOSIS — G4733 Obstructive sleep apnea (adult) (pediatric): Secondary | ICD-10-CM | POA: Diagnosis present

## 2023-04-23 DIAGNOSIS — E876 Hypokalemia: Secondary | ICD-10-CM | POA: Diagnosis present

## 2023-04-23 DIAGNOSIS — M1990 Unspecified osteoarthritis, unspecified site: Secondary | ICD-10-CM | POA: Diagnosis present

## 2023-04-23 DIAGNOSIS — Z881 Allergy status to other antibiotic agents status: Secondary | ICD-10-CM

## 2023-04-23 DIAGNOSIS — I4510 Unspecified right bundle-branch block: Secondary | ICD-10-CM | POA: Diagnosis present

## 2023-04-23 DIAGNOSIS — N3001 Acute cystitis with hematuria: Secondary | ICD-10-CM | POA: Diagnosis not present

## 2023-04-23 LAB — URINALYSIS, W/ REFLEX TO CULTURE (INFECTION SUSPECTED)
Bilirubin Urine: NEGATIVE
Glucose, UA: NEGATIVE mg/dL
Ketones, ur: 5 mg/dL — AB
Nitrite: NEGATIVE
Protein, ur: 100 mg/dL — AB
Specific Gravity, Urine: 1.029 (ref 1.005–1.030)
WBC, UA: >50 WBC/hpf (ref 0–5)
pH: 5 (ref 5.0–8.0)

## 2023-04-23 LAB — CBC WITH DIFFERENTIAL/PLATELET
Abs Immature Granulocytes: 0.23 10*3/uL — ABNORMAL HIGH (ref 0.00–0.07)
Basophils Absolute: 0.1 10*3/uL (ref 0.0–0.1)
Basophils Relative: 0 %
Eosinophils Absolute: 0.4 10*3/uL (ref 0.0–0.5)
Eosinophils Relative: 2 %
HCT: 42.6 % (ref 39.0–52.0)
Hemoglobin: 15 g/dL (ref 13.0–17.0)
Immature Granulocytes: 1 %
Lymphocytes Relative: 8 %
Lymphs Abs: 1.5 10*3/uL (ref 0.7–4.0)
MCH: 30.6 pg (ref 26.0–34.0)
MCHC: 35.2 g/dL (ref 30.0–36.0)
MCV: 86.9 fL (ref 80.0–100.0)
Monocytes Absolute: 1.6 10*3/uL — ABNORMAL HIGH (ref 0.1–1.0)
Monocytes Relative: 8 %
Neutro Abs: 15.5 10*3/uL — ABNORMAL HIGH (ref 1.7–7.7)
Neutrophils Relative %: 81 %
Platelets: 185 10*3/uL (ref 150–400)
RBC: 4.9 MIL/uL (ref 4.22–5.81)
RDW: 13.3 % (ref 11.5–15.5)
Smear Review: NORMAL
WBC: 19.2 10*3/uL — ABNORMAL HIGH (ref 4.0–10.5)
nRBC: 0 % (ref 0.0–0.2)

## 2023-04-23 LAB — RESP PANEL BY RT-PCR (RSV, FLU A&B, COVID)  RVPGX2
Influenza A by PCR: NEGATIVE
Influenza B by PCR: NEGATIVE
Resp Syncytial Virus by PCR: NEGATIVE
SARS Coronavirus 2 by RT PCR: NEGATIVE

## 2023-04-23 LAB — COMPREHENSIVE METABOLIC PANEL
ALT: 19 U/L (ref 0–44)
AST: 19 U/L (ref 15–41)
Albumin: 3.7 g/dL (ref 3.5–5.0)
Alkaline Phosphatase: 83 U/L (ref 38–126)
Anion gap: 10 (ref 5–15)
BUN: 9 mg/dL (ref 8–23)
CO2: 25 mmol/L (ref 22–32)
Calcium: 9 mg/dL (ref 8.9–10.3)
Chloride: 97 mmol/L — ABNORMAL LOW (ref 98–111)
Creatinine, Ser: 0.78 mg/dL (ref 0.61–1.24)
GFR, Estimated: >60 mL/min (ref >60)
Glucose, Bld: 159 mg/dL — ABNORMAL HIGH (ref 70–99)
Potassium: 3.8 mmol/L (ref 3.5–5.1)
Sodium: 132 mmol/L — ABNORMAL LOW (ref 135–145)
Total Bilirubin: 1.2 mg/dL (ref 0.0–1.2)
Total Protein: 8.1 g/dL (ref 6.5–8.1)

## 2023-04-23 LAB — LACTIC ACID, PLASMA
Lactic Acid, Venous: 1.6 mmol/L (ref 0.5–1.9)
Lactic Acid, Venous: 1.7 mmol/L (ref 0.5–1.9)

## 2023-04-23 LAB — PROTIME-INR
INR: 1.3 — ABNORMAL HIGH (ref 0.8–1.2)
Prothrombin Time: 16.3 s — ABNORMAL HIGH (ref 11.4–15.2)

## 2023-04-23 LAB — CBG MONITORING, ED: Glucose-Capillary: 156 mg/dL — ABNORMAL HIGH (ref 70–99)

## 2023-04-23 LAB — GROUP A STREP BY PCR: Group A Strep by PCR: NOT DETECTED

## 2023-04-23 MED ORDER — DILTIAZEM HCL ER 120 MG PO TB24
120.0000 mg | ORAL_TABLET | Freq: Every day | ORAL | Status: DC
  Administered 2023-04-24: 120 mg via ORAL
  Filled 2023-04-23 (×2): qty 1

## 2023-04-23 MED ORDER — LOSARTAN POTASSIUM 25 MG PO TABS
25.0000 mg | ORAL_TABLET | Freq: Every day | ORAL | Status: DC
  Administered 2023-04-24: 25 mg via ORAL
  Filled 2023-04-23 (×2): qty 1

## 2023-04-23 MED ORDER — METRONIDAZOLE 500 MG/100ML IV SOLN
500.0000 mg | Freq: Once | INTRAVENOUS | Status: AC
  Administered 2023-04-23: 500 mg via INTRAVENOUS
  Filled 2023-04-23: qty 100

## 2023-04-23 MED ORDER — IOHEXOL 350 MG/ML SOLN
100.0000 mL | Freq: Once | INTRAVENOUS | Status: AC | PRN
  Administered 2023-04-23: 100 mL via INTRAVENOUS

## 2023-04-23 MED ORDER — ONDANSETRON HCL 4 MG PO TABS: 4.0000 mg | ORAL_TABLET | Freq: Four times a day (QID) | ORAL | Status: DC | PRN

## 2023-04-23 MED ORDER — VANCOMYCIN HCL IN DEXTROSE 1-5 GM/200ML-% IV SOLN
1000.0000 mg | Freq: Once | INTRAVENOUS | Status: AC
  Administered 2023-04-23: 1000 mg via INTRAVENOUS
  Filled 2023-04-23: qty 200

## 2023-04-23 MED ORDER — SODIUM CHLORIDE 0.9 % IV SOLN
2.0000 g | Freq: Once | INTRAVENOUS | Status: AC
  Administered 2023-04-23: 2 g via INTRAVENOUS
  Filled 2023-04-23: qty 12.5

## 2023-04-23 MED ORDER — SODIUM CHLORIDE 0.9 % IV SOLN
2.0000 g | INTRAVENOUS | Status: DC
  Administered 2023-04-24 – 2023-04-27 (×4): 2 g via INTRAVENOUS
  Filled 2023-04-23 (×4): qty 20

## 2023-04-23 MED ORDER — ACETAMINOPHEN 325 MG PO TABS
650.0000 mg | ORAL_TABLET | Freq: Once | ORAL | Status: AC
  Administered 2023-04-23: 650 mg via ORAL
  Filled 2023-04-23: qty 2

## 2023-04-23 MED ORDER — INSULIN ASPART 100 UNIT/ML IJ SOLN
0.0000 [IU] | INTRAMUSCULAR | Status: DC
  Filled 2023-04-23: qty 1

## 2023-04-23 MED ORDER — GABAPENTIN 300 MG PO CAPS
300.0000 mg | ORAL_CAPSULE | Freq: Three times a day (TID) | ORAL | Status: DC
  Administered 2023-04-24 – 2023-04-27 (×11): 300 mg via ORAL
  Filled 2023-04-23 (×11): qty 1

## 2023-04-23 MED ORDER — ACETAMINOPHEN 650 MG RE SUPP: 650.0000 mg | Freq: Four times a day (QID) | RECTAL | Status: DC | PRN

## 2023-04-23 MED ORDER — ACETAMINOPHEN 325 MG PO TABS: 650.0000 mg | ORAL_TABLET | Freq: Four times a day (QID) | ORAL | Status: DC | PRN

## 2023-04-23 MED ORDER — PRAVASTATIN SODIUM 20 MG PO TABS
40.0000 mg | ORAL_TABLET | Freq: Every day | ORAL | Status: DC
  Administered 2023-04-24 – 2023-04-27 (×4): 40 mg via ORAL
  Filled 2023-04-23 (×4): qty 2

## 2023-04-23 MED ORDER — FLECAINIDE ACETATE 100 MG PO TABS
100.0000 mg | ORAL_TABLET | Freq: Two times a day (BID) | ORAL | Status: DC
  Administered 2023-04-24 – 2023-04-27 (×7): 100 mg via ORAL
  Filled 2023-04-23 (×7): qty 1

## 2023-04-23 MED ORDER — LACTATED RINGERS IV SOLN: INTRAVENOUS | Status: AC

## 2023-04-23 MED ORDER — SODIUM CHLORIDE 0.9 % IV BOLUS (SEPSIS)
1000.0000 mL | Freq: Once | INTRAVENOUS | Status: AC
  Administered 2023-04-23: 1000 mL via INTRAVENOUS

## 2023-04-23 MED ORDER — APIXABAN 5 MG PO TABS
5.0000 mg | ORAL_TABLET | Freq: Two times a day (BID) | ORAL | Status: DC
  Administered 2023-04-24 – 2023-04-27 (×7): 5 mg via ORAL
  Filled 2023-04-23 (×7): qty 1

## 2023-04-23 MED ORDER — TRAZODONE HCL 50 MG PO TABS
100.0000 mg | ORAL_TABLET | Freq: Every day | ORAL | Status: DC
  Administered 2023-04-24 – 2023-04-26 (×4): 100 mg via ORAL
  Filled 2023-04-23 (×3): qty 2
  Filled 2023-04-23: qty 1

## 2023-04-23 MED ORDER — TIZANIDINE HCL 4 MG PO TABS: 4.0000 mg | ORAL_TABLET | Freq: Three times a day (TID) | ORAL | Status: DC | PRN

## 2023-04-23 MED ORDER — TRAMADOL HCL 50 MG PO TABS
50.0000 mg | ORAL_TABLET | Freq: Four times a day (QID) | ORAL | Status: DC | PRN
  Administered 2023-04-25: 50 mg via ORAL
  Filled 2023-04-23: qty 1

## 2023-04-23 MED ORDER — LACTATED RINGERS IV SOLN
150.0000 mL/h | INTRAVENOUS | Status: AC
  Administered 2023-04-24: 150 mL/h via INTRAVENOUS

## 2023-04-23 MED ORDER — ONDANSETRON HCL 4 MG/2ML IJ SOLN
4.0000 mg | Freq: Four times a day (QID) | INTRAMUSCULAR | Status: DC | PRN
Start: 2023-04-23 — End: 2023-04-27
  Administered 2023-04-25: 4 mg via INTRAVENOUS
  Filled 2023-04-23: qty 2

## 2023-04-23 NOTE — Assessment & Plan Note (Signed)
Continue home meds

## 2023-04-23 NOTE — H&P (Signed)
History and Physical    Patient: Mario Mario [name] NUU:[id number] DOB: May 02, 1950 DOA: 04/23/2023 DOS: the patient was seen and examined on 04/23/2023 PCP: Gracelyn Nurse, MD  Patient coming from: Home  Chief Complaint:  Chief Complaint  Patient presents with   Fever    HPI: Mario Mario [name] is a 73 y.o. male with medical history significant for Paroxysmal A-fib on Eliquis with history of ablation, HTN, DM, OSA and morbid obesity, being admitted with urinary tract infection and SIRS/possible sepsis. Patient presented with a 3-day history of abdominal pain, nausea, weakness and dysuria.  Denies fever, vomiting or diarrhea and denies cough or shortness of breath. ED course: Febrile to 101.2 and tachycardic to 103 with otherwise unremarkable vitals. Workup notable for WBC of 19,000 with lactic acid 1.7.  Urinalysis with trace leukocyte esterase and rare bacteria.  Respiratory viral panel negative.  Group A strep PCR negative EKG, Personally viewed and interpreted showing NSR at 94 with RBBB CT abdomen and pelvis mostly nonacute but showing wall thickening of the under distended urinary bladder as follows: IMPRESSION: Mario [name] bowel obstruction, free air or free fluid. Normal appendix. Scattered stool.   Fatty liver infiltration.   Bilateral adrenal nodules. These of been present since at least January 2020 demonstrating long-term stability.   Heterogeneous prostate. Wall thickening of the underdistended urinary bladder. Please correlate with any symptoms.  Patient treated with an NS bolus and was initially given broad-spectrum antibiotics for sepsis of unknown source. Hospitalist consulted for admission.     Past Medical History:  Diagnosis Date   Anginal pain (HCC)    Arthritis    Back pain    lower   Chronic post-traumatic stress disorder (PTSD)    Cirrhosis (HCC)    Claustrophobia    Claustrophobia    Complication of anesthesia    "does not take much medication."   Depression     Diabetes mellitus without complication (HCC)    type II   Dyspnea    Dysrhythmia    Elevated lipids    Elevated lipids    Environmental and seasonal allergies    Fatty liver    GERD (gastroesophageal reflux disease)    Glaucoma    Hepatitis    type A   HH (hiatus hernia)    Hip pain Right   History of kidney stones    cystoscopy   History of rheumatoid arthritis    Knee pain    right   OSA on CPAP    Panic attack due to post traumatic stress disorder (PTSD)    Panic attacks    Paroxysmal atrial fibrillation (HCC)    Pneumonia    as a child   Sleep apnea    CPAP   Vertigo    Past Surgical History:  Procedure Laterality Date   BACK SURGERY     L4-L5 fusion   Bone graft autograft  1954   hip to wrist bilateral. Had tumors on wrist.   CATARACT EXTRACTION W/PHACO Left 10/22/2022   Procedure: CATARACT EXTRACTION PHACO AND INTRAOCULAR LENS PLACEMENT (IOC) LEFT DIABETIC;  Surgeon: Lockie Mola, MD;  Location: [facility];  Service: Ophthalmology;  Laterality: Left;  8.82 0:35.0   CATARACT EXTRACTION W/PHACO Right 11/05/2022   Procedure: CATARACT EXTRACTION PHACO AND INTRAOCULAR LENS PLACEMENT (IOC) RIGHT DIABETIC 9.31 00:39.6;  Surgeon: Lockie Mola, MD;  Location: [facility];  Service: Ophthalmology;  Laterality: Right;   COLONOSCOPY WITH PROPOFOL N/A 04/13/2018   Procedure: COLONOSCOPY WITH PROPOFOL;  Surgeon: [place],  Boykin Nearing, MD;  Location: [facility];  Service: Gastroenterology;  Laterality: N/A;   ESOPHAGOGASTRODUODENOSCOPY (EGD) WITH PROPOFOL N/A 04/13/2018   Procedure: ESOPHAGOGASTRODUODENOSCOPY (EGD) WITH PROPOFOL;  Surgeon: [place], Boykin Nearing, MD;  Location: [facility];  Service: Gastroenterology;  Laterality: N/A;   JOINT REPLACEMENT     RADIOLOGY WITH ANESTHESIA N/A 12/15/2019   Procedure: MRI LUMBAR SPINE WITH AND WITHOUT CONTRAST; THORASIC SPINE WITHOUT CONTRAST;  Surgeon: Radiologist, Medication, MD;  Location: [facility];  Service:  Radiology;  Laterality: N/A;   REPLACEMENT TOTAL KNEE Left    TOTAL HIP ARTHROPLASTY Right 01/26/2018   Procedure: TOTAL HIP ARTHROPLASTY ANTERIOR APPROACH;  Surgeon: Kennedy Bucker, MD;  Location: [facility];  Service: Orthopedics;  Laterality: Right;   TOTAL HIP REVISION Right 11/02/2018   Procedure: TOTAL HIP REVISION WITH EXCISION OF HETEROTOPIC OSSIFICATION;  Surgeon: Kennedy Bucker, MD;  Location: [facility];  Service: Orthopedics;  Laterality: Right;   Social History:  reports that he has never smoked. He has never used smokeless tobacco. He reports that he does not currently use alcohol. He reports that he does not use drugs.  Allergies  Allergen Reactions   Levaquin [Levofloxacin] Itching    Red lines    History reviewed. Mario [name] pertinent family history.  Prior to Admission medications   Medication Sig Start Date End Date Taking? Authorizing Provider  Camphor-Eucalyptus-Menthol (VICKS VAPORUB EX) Place 1 application into both nostrils at bedtime as needed (congestion).     [provider]  diltiazem (TIAZAC) 120 MG 24 hr capsule Take 120 mg by mouth daily.    [provider]  ELIQUIS 5 MG TABS tablet Take 5 mg by mouth 2 (two) times daily. 08/27/21   [provider]  flecainide (TAMBOCOR) 100 MG tablet Take 100 mg by mouth 2 (two) times daily. 09/05/21   [provider]  gabapentin (NEURONTIN) 300 MG capsule Take 300 mg by mouth 3 (three) times daily.    [provider]  glimepiride (AMARYL) 4 MG tablet Take 4 mg by mouth daily with breakfast.     [provider]  losartan (COZAAR) 25 MG tablet Take 25 mg by mouth daily. 08/06/21   [provider]  Magnesium 400 MG CAPS Take 400 mg by mouth daily.    [provider]  meloxicam (MOBIC) 15 MG tablet Take 15 mg by mouth daily as needed for pain.    [provider]  metFORMIN (GLUCOPHAGE) 1000 MG tablet Take 1,000 mg by mouth 2 (two) times daily with a meal.     [provider]  pravastatin (PRAVACHOL) 40 MG tablet Take 40 mg by mouth daily.    [provider]  tiZANidine (ZANAFLEX) 4 MG tablet Take 4 mg by mouth every 8 (eight) hours as needed for muscle spasms.  12/02/19   [provider]  traMADol (ULTRAM) 50 MG tablet Take 50 mg by mouth every 6 (six) hours as needed (per patient).    [provider]  traZODone (DESYREL) 100 MG tablet Take 100 mg by mouth at bedtime.    [provider]    Physical Exam: Vitals:   04/23/23 2000 04/23/23 2030 04/23/23 2121 04/23/23 2130  BP: 130/63 (!) 153/57  (!) 144/71  Pulse: 93 96 97 100  Resp:   18 19  Temp:   98.4 F (36.9 C)   SpO2: 98% 97% 98% 100%  Weight:      Height:       Physical Exam Vitals and nursing note  reviewed.  Constitutional:      General: He is not in acute distress. HENT:     Head: Normocephalic and atraumatic.  Cardiovascular:     Rate and Rhythm: Normal rate and regular rhythm.     Heart sounds: Normal heart sounds.  Pulmonary:     Effort: Pulmonary effort is normal.     Breath sounds: Normal breath sounds.  Abdominal:     Palpations: Abdomen is soft.     Tenderness: There is Mario [name] abdominal tenderness.  Neurological:     Mental Status: Mental status is at baseline.     Labs on Admission: I have personally reviewed following labs and imaging studies  CBC: Recent Labs  Lab 04/23/23 1516  WBC 19.2*  NEUTROABS 15.5*  HGB 15.0  HCT 42.6  MCV 86.9  PLT 185   Basic Metabolic Panel: Recent Labs  Lab 04/23/23 1516  NA 132*  K 3.8  CL 97*  CO2 25  GLUCOSE 159*  BUN 9  CREATININE 0.78  CALCIUM 9.0   GFR: Estimated Creatinine Clearance: 109.5 mL/min (by C-G formula based on SCr of 0.78 mg/dL). Liver Function Tests: Recent Labs  Lab 04/23/23 1516  AST 19  ALT 19  ALKPHOS 83  BILITOT 1.2  PROT 8.1  ALBUMIN 3.7   Mario [name] results for input(s): "LIPASE", "AMYLASE" in the last 168 hours. Mario [name] results for input(s):  "AMMONIA" in the last 168 hours. Coagulation Profile: Recent Labs  Lab 04/23/23 1516  INR 1.3*   Cardiac Enzymes: Mario [name] results for input(s): "CKTOTAL", "CKMB", "CKMBINDEX", "TROPONINI" in the last 168 hours. BNP (last 3 results) Mario [name] results for input(s): "PROBNP" in the last 8760 hours. HbA1C: Mario [name] results for input(s): "HGBA1C" in the last 72 hours. CBG: Mario [name] results for input(s): "GLUCAP" in the last 168 hours. Lipid Profile: Mario [name] results for input(s): "CHOL", "HDL", "LDLCALC", "TRIG", "CHOLHDL", "LDLDIRECT" in the last 72 hours. Thyroid Function Tests: Mario [name] results for input(s): "TSH", "T4TOTAL", "FREET4", "T3FREE", "THYROIDAB" in the last 72 hours. Anemia Panel: Mario [name] results for input(s): "VITAMINB12", "FOLATE", "FERRITIN", "TIBC", "IRON", "RETICCTPCT" in the last 72 hours. Urine analysis:    Component Value Date/Time   COLORURINE AMBER (A) 04/23/2023 1516   APPEARANCEUR HAZY (A) 04/23/2023 1516   LABSPEC 1.029 04/23/2023 1516   PHURINE 5.0 04/23/2023 1516   GLUCOSEU NEGATIVE 04/23/2023 1516   HGBUR MODERATE (A) 04/23/2023 1516   BILIRUBINUR NEGATIVE 04/23/2023 1516   KETONESUR 5 (A) 04/23/2023 1516   PROTEINUR 100 (A) 04/23/2023 1516   NITRITE NEGATIVE 04/23/2023 1516   LEUKOCYTESUR TRACE (A) 04/23/2023 1516    Radiological Exams on Admission: CT ABDOMEN PELVIS W CONTRAST Result Date: 04/23/2023 CLINICAL DATA:  Urinary retention with epigastric pain.  Nausea EXAM: CT ABDOMEN AND PELVIS WITH CONTRAST TECHNIQUE: Multidetector CT imaging of the abdomen and pelvis was performed using the standard protocol following bolus administration of intravenous contrast. RADIATION DOSE REDUCTION: This exam was performed according to the departmental dose-optimization program which includes automated exposure control, adjustment of the mA and/or kV according to patient size and/or use of iterative reconstruction technique. CONTRAST:  OMNIPAQUE IOHEXOL 350 MG/ML SOLN COMPARISON:  CT 03/17/2018.  FINDINGS: Lower chest: Breathing motion at the lung bases. Mario [name] pleural effusion or pneumothorax. Coronary artery calcifications are seen. Hepatobiliary: Mario [name] space-occupying liver lesion. Patent portal vein. Gallbladder is nondilated. Pancreas: Unremarkable. Mario [name] pancreatic ductal dilatation or surrounding inflammatory changes. Spleen: Normal in size without focal abnormality. Adrenals/Urinary Tract: Bilateral adrenal nodules are identified. These were seen previously. The left-sided  lesion today measures 2.3 by 1.8 cm. Hounsfield unit of 63 on portal venous phase. On delay 76. Not clearly an adenoma. On prior exam diameter lesion was 2.2 by 1.6 cm demonstrating almost 5 years of stability over 5 years stability consistent with a benign lesion. The right-sided lesion today measures 14 mm in maximal dimension. This has Hounsfield unit of 43 on portal venous phase and 20 on standard renal delay consistent with an adenoma. Bosniak 1 right-sided small renal cysts are identified. Largest measures 2.7 cm in diameter. Mario [name] enhancing renal mass or collecting system dilatation. The ureters have normal course and caliber extending down to the urinary bladder. Bladder is underdistended with slight wall thickening. Stomach/Bowel: Large bowel has a normal course and caliber with scattered stool. Normal appendix. Stomach and small bowel are nondilated. Vascular/Lymphatic: Aortic atherosclerosis. Mario [name] enlarged abdominal or pelvic lymph nodes. Reproductive: Mildly enlarged prostate. Other: Tiny fat containing umbilical hernias. Mario [name] free air or free fluid. 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 Scattered degenerative changes of the spine and pelvis. Fixation hardware along the lumbar spine as well. IMPRESSION: Mario [name] bowel obstruction, free air or free fluid. Normal appendix. Scattered stool. Fatty liver infiltration. Bilateral adrenal nodules. These of been present since at least January 2020  demonstrating long-term stability. Heterogeneous prostate. Wall thickening of the underdistended urinary bladder. Please correlate with any symptoms. Breathing motion Electronically Signed   By: Karen Kays M.D.   On: 04/23/2023 21:27   DG Chest 2 View Result Date: 04/23/2023 CLINICAL DATA:  Chills and fever. EXAM: CHEST - 2 VIEW COMPARISON:  None Available. FINDINGS: The heart size and mediastinal contours are within normal limits. Mario [name] focal consolidation, pleural effusion, or pneumothorax. Mario [name] acute osseous abnormality. IMPRESSION: Mario [name] acute cardiopulmonary findings. Electronically Signed   By: Hart Robinsons M.D.   On: 04/23/2023 16:45     Data Reviewed: Relevant notes from primary care and specialist visits, past discharge summaries as available in EHR, including Care Everywhere. Prior diagnostic testing as pertinent to current admission diagnoses Updated medications and problem lists for reconciliation ED course, including vitals, labs, imaging, treatment and response to treatment Triage notes, nursing and pharmacy notes and ED provider's notes Notable results as noted in HPI   Assessment and Plan: * Urinary tract infection SIRS, possible sepsis Abdominal pain CT for the most part unrevealing but showing bladder wall thickening SIRS criteria include fever and tachycardia source of infection with leukocytosis Sepsis fluids Rocephin Follow cultures  HTN (hypertension) Continue home meds  OSA on CPAP CPAP nightly  Paroxysmal atrial fibrillation (HCC) Continue flecainide, diltiazem and Eliquis  Diabetes mellitus without complication (HCC) Sliding scale insulin coverage    DVT prophylaxis: eliquis  Consults: none  Advance Care Planning:   Code Status: Prior   Family Communication: none  Disposition Plan: Back to previous home environment  Severity of Illness: The appropriate patient status for this patient is INPATIENT. Inpatient status is judged to be reasonable  and necessary in order to provide the required intensity of service to ensure the patient's safety. The patient's presenting symptoms, physical exam findings, and initial radiographic and laboratory data in the context of their chronic comorbidities is felt to place them at high risk for further clinical deterioration. Furthermore, it is not anticipated that the patient will be medically stable for discharge from the hospital within 2 midnights of admission.   * I certify that at the point of admission it is my clinical judgment that the patient will  require inpatient hospital care spanning beyond 2 midnights from the point of admission due to high intensity of service, high risk for further deterioration and high frequency of surveillance required.*  Author: Andris Baumann, MD 04/23/2023 10:40 PM  For on call review [email or web address].

## 2023-04-23 NOTE — Consult Note (Signed)
CODE SEPSIS - PHARMACY COMMUNICATION  **Broad Spectrum Antibiotics should be administered within 1 hour of Sepsis diagnosis**  Time Code Sepsis Called/Page Received: 2001  Antibiotics Ordered: cefepime, metronidazole, vancomycin  Time of 1st antibiotic administration: 2056  Additional action taken by pharmacy: none  If necessary, Name of Provider/Nurse Contacted: n/a   Carolos Fecher Rodriguez-Guzman PharmD, BCPS 04/23/2023 8:33 PM

## 2023-04-23 NOTE — Assessment & Plan Note (Signed)
CPAP nightly

## 2023-04-23 NOTE — Assessment & Plan Note (Signed)
SIRS, possible sepsis SIRS criteria include fever and tachycardia source of infection with leukocytosis Sepsis fluids Rocephin Follow cultures

## 2023-04-23 NOTE — ED Notes (Signed)
Patient transported to X-ray

## 2023-04-23 NOTE — Assessment & Plan Note (Signed)
Continue flecainide, diltiazem and Eliquis

## 2023-04-23 NOTE — ED Provider Notes (Signed)
[facility] Provider Note    Event Date/Time   First MD Initiated Contact with Patient 04/23/23 1949     (approximate)  History   Chief Complaint: Fever  HPI  Mario [name] is a 73 y.o. male with a past medical history of arthritis, diabetes, gastric reflux, paroxysmal atrial fibrillation on Eliquis, hypertension, presents to the emergency department for 3 to 4 days of generalized weakness fatigue lower back pain nausea and dysuria.  Patient states a slight burning sensation over the past couple days and he is also noticed some mild episodes of urinary incontinence over this time as well.  Patient states pain to his bilateral lower to mid back.  Patient noted to be febrile to 101.2 here subjective fever/chills at home along with nausea and vomiting.  Physical Exam   Triage Vital Signs: ED Triage Vitals  Encounter Vitals Group     BP 04/23/23 1514 123/67     Systolic BP Percentile --      Diastolic BP Percentile --      Pulse Rate 04/23/23 1513 (!) 103     Resp 04/23/23 1513 16     Temp 04/23/23 1513 (!) 101.2 F (38.4 C)     Temp src --      SpO2 04/23/23 1514 95 %     Weight 04/23/23 1514 285 lb (129.3 kg)     Height 04/23/23 1514 5\' 9"  (1.753 m)     Head Circumference --      Peak Flow --      Pain Score 04/23/23 1513 6     Pain Loc --      Pain Education --      Exclude from Growth Chart --     Most recent vital signs: Vitals:   04/23/23 1513 04/23/23 1514  BP:  123/67  Pulse: (!) 103   Resp: 16   Temp: (!) 101.2 F (38.4 C)   SpO2:  95%    General: Awake, no distress.  CV:  Good peripheral perfusion.  Regular rate and rhythm  Resp:  Normal effort.  Equal breath sounds bilaterally.  Abd:  No distention.  Soft, nontender.  No rebound or guarding.  ED Results / Procedures / Treatments    RADIOLOGY  I have reviewed and interpreted the CT images.  No obvious or significant abnormality seen on my evaluation. Radiology is read the CT  scan as bladder wall thickening no other acute finding.   MEDICATIONS ORDERED IN ED: Medications  lactated ringers infusion (has no administration in time range)  sodium chloride 0.9 % bolus 1,000 mL (has no administration in time range)  ceFEPIme (MAXIPIME) 2 g in sodium chloride 0.9 % 100 mL IVPB (has no administration in time range)  metroNIDAZOLE (FLAGYL) IVPB 500 mg (has no administration in time range)  vancomycin (VANCOCIN) IVPB 1000 mg/200 mL premix (has no administration in time range)  acetaminophen (TYLENOL) tablet 650 mg (650 mg Oral Given 04/23/23 1521)     IMPRESSION / MDM / ASSESSMENT AND PLAN / ED COURSE  I reviewed the triage vital signs and the nursing notes.  Patient's presentation is most consistent with acute presentation with potential threat to life or bodily function.  Patient presents to the emergency department for generalized weakness fever chills nausea vomiting lower back pain and dysuria symptoms.  Patient is febrile to one 1.2 tachycardic to 103 meeting SIRS/sepsis criteria.  Patient's lab work has resulted showing a negative respiratory panel negative strep, reassuring  chemistry normal lactate at 1.6 however patient has significant leukocytosis 19,000.  Patient's chest x-ray is clear.  Urinalysis pending we will proceed with a CT scan abdomen/pelvis.  Given the patient's dysuria with slight incontinence along with his other symptoms highly suspect urinary tract infection likely develop now with a pyelonephritis.  Given the patient's elevated heart rate fever and white count we will cover with IV antibiotics as the patient meets sepsis criteria.  Given his nausea vomiting anticipate likely admission to the hospital for further workup treatment and continued IV antibiotics.  Patient's urinalysis does show greater than 50 white cells with rare bacteria.  CT scan shows thickening of the bladder wall highly suspect UTI/cystitis leading to sepsis.  Will continue IV  antibiotics admit the patient to the hospital service for further workup and treatment.   CRITICAL CARE Performed by: Minna Antis   Total critical care time: 30 minutes  Critical care time was exclusive of separately billable procedures and treating other patients.  Critical care was necessary to treat or prevent imminent or life-threatening deterioration.  Critical care was time spent personally by me on the following activities: development of treatment plan with patient and/or surrogate as well as nursing, discussions with consultants, evaluation of patient's response to treatment, examination of patient, obtaining history from patient or surrogate, ordering and performing treatments and interventions, ordering and review of laboratory studies, ordering and review of radiographic studies, pulse oximetry and re-evaluation of patient's condition.  FINAL CLINICAL IMPRESSION(S) / ED DIAGNOSES   Sepsis Pyelonephritis UTI   Note:  This document was prepared using Dragon voice recognition software and may include unintentional dictation errors.   Minna Antis, MD 04/23/23 2226

## 2023-04-23 NOTE — Assessment & Plan Note (Signed)
Sliding scale insulin coverage

## 2023-04-23 NOTE — ED Triage Notes (Signed)
Pt to ED for not feeling well for 2 days. Back pain, chills, emesis, fever.

## 2023-04-23 NOTE — Progress Notes (Signed)
Elink monitoring for the code sepsis protocol.

## 2023-04-23 NOTE — ED Provider Triage Note (Signed)
Emergency Medicine Provider Triage Evaluation Note  Mario [name] , a 73 y.o. male  was evaluated in triage.  Pt complains of multiple complaints. Complains of urinary retention, epigastric pain, headache and dizziness with positional change x 2 days with a feeling of regurgitation, no vomiting.   Review of Systems  Positive: fever Negative: Chest pain, SOB  Physical Exam  BP 123/67   Pulse (!) 103   Temp (!) 101.2 F (38.4 C)   Resp 16   Ht 5\' 9"  (1.753 m)   Wt 129.3 kg   SpO2 95%   BMI 42.09 kg/m  Gen:   Awake, no distress   Resp:  Normal effort LCTAB MSK:   Moves extremities without difficulty  Other:  Epigastric tenderness.   Medical Decision Making  Medically screening exam initiated at 3:24 PM.  Appropriate orders placed.  Pratik Dalziel was informed that the remainder of the evaluation will be completed by another provider, this initial triage assessment does not replace that evaluation, and the importance of remaining in the ED until their evaluation is complete.    Romeo Apple, Kirah Stice A, PA-C 04/23/23 1527

## 2023-04-24 DIAGNOSIS — N3001 Acute cystitis with hematuria: Secondary | ICD-10-CM

## 2023-04-24 LAB — BASIC METABOLIC PANEL
Anion gap: 7 (ref 5–15)
BUN: 10 mg/dL (ref 8–23)
CO2: 24 mmol/L (ref 22–32)
Calcium: 8.6 mg/dL — ABNORMAL LOW (ref 8.9–10.3)
Chloride: 102 mmol/L (ref 98–111)
Creatinine, Ser: 0.75 mg/dL (ref 0.61–1.24)
GFR, Estimated: >60 mL/min (ref >60)
Glucose, Bld: 123 mg/dL — ABNORMAL HIGH (ref 70–99)
Potassium: 3.4 mmol/L — ABNORMAL LOW (ref 3.5–5.1)
Sodium: 133 mmol/L — ABNORMAL LOW (ref 135–145)

## 2023-04-24 LAB — PROTIME-INR
INR: 1.3 — ABNORMAL HIGH (ref 0.8–1.2)
Prothrombin Time: 16.3 s — ABNORMAL HIGH (ref 11.4–15.2)

## 2023-04-24 LAB — GLUCOSE, CAPILLARY
Glucose-Capillary: 117 mg/dL — ABNORMAL HIGH (ref 70–99)
Glucose-Capillary: 110 mg/dL — ABNORMAL HIGH (ref 70–99)
Glucose-Capillary: 173 mg/dL — ABNORMAL HIGH (ref 70–99)
Glucose-Capillary: 110 mg/dL — ABNORMAL HIGH (ref 70–99)

## 2023-04-24 LAB — HEMOGLOBIN A1C
Hgb A1c MFr Bld: 5.7 % — ABNORMAL HIGH (ref 4.8–5.6)
Mean Plasma Glucose: 116.89 mg/dL

## 2023-04-24 LAB — PHOSPHORUS: Phosphorus: 1.9 mg/dL — ABNORMAL LOW (ref 2.5–4.6)

## 2023-04-24 LAB — MAGNESIUM: Magnesium: 1.9 mg/dL (ref 1.7–2.4)

## 2023-04-24 MED ORDER — POTASSIUM CHLORIDE CRYS ER 20 MEQ PO TBCR
40.0000 meq | EXTENDED_RELEASE_TABLET | Freq: Once | ORAL | Status: AC
  Administered 2023-04-24: 40 meq via ORAL
  Filled 2023-04-24: qty 2

## 2023-04-24 MED ORDER — POTASSIUM PHOSPHATES 15 MMOLE/5ML IV SOLN
30.0000 mmol | Freq: Once | INTRAVENOUS | Status: AC
  Administered 2023-04-24: 30 mmol via INTRAVENOUS
  Filled 2023-04-24: qty 10

## 2023-04-24 MED ORDER — INSULIN ASPART 100 UNIT/ML IJ SOLN: 0.0000 [IU] | INTRAMUSCULAR | Status: DC

## 2023-04-24 MED ORDER — INSULIN ASPART 100 UNIT/ML IJ SOLN
0.0000 [IU] | Freq: Three times a day (TID) | INTRAMUSCULAR | Status: DC
  Administered 2023-04-24: 1 [IU] via SUBCUTANEOUS
  Filled 2023-04-24: qty 1

## 2023-04-24 NOTE — Evaluation (Signed)
Physical Therapy Evaluation Patient Details Name: Mario [name] MRN: [id number] DOB: 07-Sep-1950 Today's Date: 04/24/2023  History of Present Illness  Pt is a 73 y.o. male presented with a 3-day history of abdominal pain, nausea, weakness and dysuria. Admitted with UTI and SIRS/possible sepsis. PMH includes P A-fib on Eliquis with history of ablation, HTN, DM, OSA and morbid obesity.   Clinical Impression  Pt admitted with above diagnosis.  Pt currently with functional limitations due to the deficits listed below (see PT Problem List). Pt received supine asleep but awoken to voice and light touch. Agreeable to PT eval. At baseline he is independent with gait in household. Uses RW mod-I for community. Spouse assists with LB dressing and bathing. To date increased time and effort for bed mobility. CGA for STS to RW that is effortful due to baseline RLE MSK pains at baseline. Able to ambulate ~220' today with RW at supervision. Assist provided for toileting prior to gait with dependent sock donning/doffing but set up assist for doffing boxers and donning clean gown. Pt returns to recliner with all needs in reach. Pt declining any need for PT f/u due to poor experiences in the past. Pt would benefit from PT f/u in house during admission to address acute weakness. Pt with all needs in reach.     If plan is discharge home, recommend the following: A little help with walking and/or transfers;Assistance with cooking/housework;A little help with bathing/dressing/bathroom;Help with stairs or ramp for entrance   Can travel by private vehicle        Equipment Recommendations None recommended by PT  Recommendations for Other Services       Functional Status Assessment Patient has had a recent decline in their functional status and demonstrates the ability to make significant improvements in function in a reasonable and predictable amount of time.     Precautions / Restrictions Precautions Precautions:  Fall Restrictions Weight Bearing Restrictions Per Provider Order: No      Mobility  Bed Mobility Overal bed mobility: Needs Assistance Bed Mobility: Supine to Sit     Supine to sit: Supervision, Used rails       Patient Response: Cooperative  Transfers Overall transfer level: Needs assistance Equipment used: Rolling walker (2 wheels) Transfers: Sit to/from Stand Sit to Stand: Contact guard assist           General transfer comment: increased time and effort to stand due to chronic R hip and knee pain    Ambulation/Gait Ambulation/Gait assistance: Supervision Gait Distance (Feet): 220 Feet Assistive device: Rolling walker (2 wheels) Gait Pattern/deviations: Step-through pattern, Decreased step length - right, Decreased step length - left       General Gait Details: consistent step through. Mildly antalgic on RLE due to MSK pains at baseline.  Stairs            Wheelchair Mobility     Tilt Bed Tilt Bed Patient Response: Cooperative  Modified Rankin (Stroke Patients Only)       Balance Overall balance assessment: Needs assistance Sitting-balance support: Feet supported Sitting balance-Leahy Scale: Good     Standing balance support: Reliant on assistive device for balance, Bilateral upper extremity supported Standing balance-Leahy Scale: Fair                               Pertinent Vitals/Pain Pain Assessment Pain Assessment: Faces Faces Pain Scale: Hurts even more Pain Location: R hip and knee Pain Descriptors /  Indicators: Aching, Throbbing, Tender, Sore Pain Intervention(s): Monitored during session, Limited activity within patient's tolerance, Repositioned    Home Living Family/patient expects to be discharged to:: Private residence Living Arrangements: Spouse/significant other Available Help at Discharge: Family Type of Home: House Home Access: Level entry       Home Layout: One level Home Equipment: Shower seat -  built Char[employer] fundraiser (2 wheels)      Prior Function Prior Level of Function : Independent/Modified Independent;Driving             Mobility Comments: RW use as needed-reports use of shopping cart at the grocery store; denies falls ADLs Comments: IND with ADLs, was driving to the grocery store, cooks at home     Extremity/Trunk Assessment   Upper Extremity Assessment Upper Extremity Assessment: Overall WFL for tasks assessed    Lower Extremity Assessment Lower Extremity Assessment: Generalized weakness    Cervical / Trunk Assessment Cervical / Trunk Assessment: Normal  Communication   Communication Communication: No apparent difficulties    Cognition Arousal: Alert Behavior During Therapy: WFL for tasks assessed/performed                             Following commands: Intact       Cueing Cueing Techniques: Verbal cues     General Comments      Exercises     Assessment/Plan    PT Assessment Patient needs continued PT services  PT Problem List Decreased strength;Decreased mobility;Pain       PT Treatment Interventions DME instruction;Neuromuscular re-education;Gait training;Stair training;Patient/family education;Functional mobility training;Therapeutic activities;Therapeutic exercise;Balance training    PT Goals (Current goals can be found in the Care Plan section)  Acute Rehab PT Goals Patient Stated Goal: improve LE strength PT Goal Formulation: With patient Time For Goal Achievement: 05/08/23 Potential to Achieve Goals: Fair    Frequency Min 1X/week     Co-evaluation               AM-PAC PT "6 Clicks" Mobility  Outcome Measure Help needed turning from your back to your side while in a flat bed without using bedrails?: A Lot Help needed moving from lying on your back to sitting on the side of a flat bed without using bedrails?: A Lot Help needed moving to and from a bed to a chair (including a wheelchair)?: A Little Help  needed standing up from a chair using your arms (e.g., wheelchair or bedside chair)?: A Little Help needed to walk in hospital room?: A Little Help needed climbing 3-5 steps with a railing? : A Little 6 Click Score: 16    End of Session Equipment Utilized During Treatment: Gait belt Activity Tolerance: Patient tolerated treatment well Patient left: in chair;with call bell/phone within reach;with chair alarm set Nurse Communication: Mobility status PT Visit Diagnosis: Other abnormalities of gait and mobility (R26.89);Muscle weakness (generalized) (M62.81);Pain Pain - Right/Left: Right Pain - part of body: Leg    Time: 1416-1446 PT Time Calculation (min) (ACUTE ONLY): 30 min   Charges:   PT Evaluation $PT Eval Moderate Complexity: 1 Mod PT Treatments $Therapeutic Activity: 23-37 mins PT General Charges $$ ACUTE PT VISIT: 1 Visit        Delphia Grates. Fairly IV, PT, DPT Physical Therapist- [place]  [facility]  04/24/2023, 3:52 PM

## 2023-04-24 NOTE — Evaluation (Addendum)
Occupational Therapy Evaluation Patient Details Name: Mario [name] MRN: [id number] DOB: 05-16-1950 Today's Date: 04/24/2023   History of Present Illness   Pt is a 73 y.o. male presented with a 3-day history of abdominal pain, nausea, weakness and dysuria. Admitted with UTI and SIRS/possible sepsis. PMH includes P A-fib on Eliquis with history of ablation, HTN, DM, OSA and morbid obesity     Clinical Impressions Pt was seen for OT evaluation this date. Prior to hospital admission, pt was living at home with his wife where he reports IND/MOD I with ADLs and mobility. Intermittent use of RW as needed, cooking, driving, and denies fall history.   Pt presents to acute OT demonstrating impaired ADL performance and functional mobility 2/2 weakness, mild balance deficits, and pain (See OT problem list for additional functional deficits). Pt currently requires CGA/SBA for bed mobility with increased time/effort, but no physical assist provided-reports this as baseline for him d/t pain in R hip/knee and L mid back region. Max A for LB dressing to don socks-reports this is baseline for several years with wife assist. Min A for STS from EOB with bed height elevated to RW. Pt ambulated ~50-60 feet with CGA before returning to bed, then ambulated an additional 30 feet to the bathroom. Min A for toilet transfer with BSC over top of toilet.  Pt would benefit from skilled OT services to address noted impairments and functional limitations (see below for any additional details) in order to maximize safety and independence while minimizing falls risk and caregiver burden. Do anticipate the need for follow up OT services upon acute hospital DC. Pt would prefer to return home with therapy vs go to STR and will likely be able to do so with maximized therapy here in the hospital.      If plan is discharge home, recommend the following:   A little help with walking and/or transfers;A little help with  bathing/dressing/bathroom;Assistance with cooking/housework;Assist for transportation     Functional Status Assessment   Patient has had a recent decline in their functional status and demonstrates the ability to make significant improvements in function in a reasonable and predictable amount of time.     Equipment Recommendations   None recommended by OT     Recommendations for Other Services         Precautions/Restrictions   Precautions Precautions: Fall Restrictions Weight Bearing Restrictions Per Provider Order: No     Mobility Bed Mobility Overal bed mobility: Needs Assistance Bed Mobility: Supine to Sit, Sit to Supine     Supine to sit: Supervision, Contact guard, Used rails Sit to supine: Used rails, Contact guard assist, Supervision   General bed mobility comments: increased effort and time needed, but no physical assist    Transfers Overall transfer level: Needs assistance Equipment used: Rolling walker (2 wheels) Transfers: Sit to/from Stand Sit to Stand: Min assist, Mod assist           General transfer comment: Min A for STS from EOB to RW and from commode; ambulated to the door and back with CGA      Balance Overall balance assessment: Needs assistance Sitting-balance support: Feet supported Sitting balance-Leahy Scale: Good     Standing balance support: Reliant on assistive device for balance, Bilateral upper extremity supported Standing balance-Leahy Scale: Fair Standing balance comment: CGA, reliant on RW; no LOB  ADL either performed or assessed with clinical judgement   ADL Overall ADL's : Needs assistance/impaired                     Lower Body Dressing: Maximal assistance Lower Body Dressing Details (indicate cue type and reason): reports this is baseline-wife assists him with LB dressing Toilet Transfer: Contact guard assist;BSC/3in1;Regular Toilet;Grab bars;Rolling walker (2  wheels)                   Vision         Perception         Praxis         Pertinent Vitals/Pain Pain Assessment Pain Assessment: 0-10 Pain Score: 7  Pain Location: R hip and knee and L mid back as well as neck arthritis Pain Descriptors / Indicators: Aching, Throbbing, Tender, Sore Pain Intervention(s): Monitored during session, Limited activity within patient's tolerance, Repositioned     Extremity/Trunk Assessment Upper Extremity Assessment Upper Extremity Assessment: Overall WFL for tasks assessed   Lower Extremity Assessment Lower Extremity Assessment: Generalized weakness       Communication Communication Communication: No apparent difficulties   Cognition Arousal: Alert Behavior During Therapy: WFL for tasks assessed/performed Cognition: No apparent impairments                               Following commands: Intact       Cueing  General Comments   Cueing Techniques: Verbal cues      Exercises Other Exercises Other Exercises: edu on role of OT in acute setting and importance of therapy   Shoulder Instructions      Home Living Family/patient expects to be discharged to:: Private residence Living Arrangements: Spouse/significant other Available Help at Discharge: Family Type of Home: House Home Access: Level entry     Home Layout: One level     Bathroom Shower/Tub: Producer, television/film/video: Handicapped height     Home Equipment: Shower seat - built Charity fundraiser (2 wheels)          Prior Functioning/Environment Prior Level of Function : Independent/Modified Independent;Driving             Mobility Comments: RW use as needed-reports use of shopping cart at the grocery store; denies falls ADLs Comments: IND with ADLs, was driving to the grocery store, cooks at home    OT Problem List: Pain;Impaired balance (sitting and/or standing)   OT Treatment/Interventions: Self-care/ADL  training;Therapeutic exercise;Patient/family education;Balance training;Therapeutic activities;DME and/or AE instruction      OT Goals(Current goals can be found in the care plan section)   Acute Rehab OT Goals Patient Stated Goal: return home OT Goal Formulation: With patient Time For Goal Achievement: 05/08/23 Potential to Achieve Goals: Good ADL Goals Pt Will Perform Lower Body Bathing: with min assist;sitting/lateral leans;sit to/from stand Pt Will Transfer to Toilet: with modified independence;ambulating;regular height toilet;grab bars Pt Will Perform Toileting - Clothing Manipulation and hygiene: with modified independence;sitting/lateral leans;sit to/from stand   OT Frequency:  Min 1X/week    Co-evaluation              AM-PAC OT "6 Clicks" Daily Activity     Outcome Measure Help from another person eating meals?: None Help from another person taking care of personal grooming?: None Help from another person toileting, which includes using toliet, bedpan, or urinal?: A Little Help from another person bathing (including washing, rinsing,  drying)?: A Lot Help from another person to put on and taking off regular upper body clothing?: None Help from another person to put on and taking off regular lower body clothing?: A Lot 6 Click Score: 19   End of Session Equipment Utilized During Treatment: Gait belt;Rolling walker (2 wheels) Nurse Communication: Mobility status  Activity Tolerance: Patient tolerated treatment well Patient left: in bed;with call bell/phone within reach;with bed alarm set  OT Visit Diagnosis: Other abnormalities of gait and mobility (R26.89);Unsteadiness on feet (R26.81)                Time: 1130-1159 OT Time Calculation (min): 29 min Charges:  OT General Charges $OT Visit: 1 Visit OT Evaluation $OT Eval Moderate Complexity: 1 Mod OT Treatments $Self Care/Home Management : 8-22 mins Natashia Roseman, OTR/L 04/24/23, 1:11 PM  Marshayla Mitschke E  Adedamola Seto 04/24/2023, 1:04 PM

## 2023-04-24 NOTE — Progress Notes (Signed)
[facility] Progress Note  Patient: Mario [name]    HQI:[id number]  DOA: 04/23/2023     Date of Service: the patient was seen and examined on 04/24/2023  Chief Complaint  Patient presents with   Fever   Brief hospital course:  Mario [name] is a 73 y.o. male with medical history significant for Paroxysmal A-fib on Eliquis with history of ablation, HTN, DM, OSA and morbid obesity, being admitted with urinary tract infection and SIRS/possible sepsis. Patient presented with a 3-day history of abdominal pain, nausea, weakness and dysuria.  Denies fever, vomiting or diarrhea and denies cough or shortness of breath. ED course: Febrile to 101.2 and tachycardic to 103 with otherwise unremarkable vitals. Workup notable for WBC of 19,000 with lactic acid 1.7.  Urinalysis with trace leukocyte esterase and rare bacteria.  Respiratory viral panel negative.  Group A strep PCR negative EKG, Personally viewed and interpreted showing NSR at 94 with RBBB CT abdomen and pelvis mostly nonacute but showing wall thickening of the under distended urinary bladder as follows:  IMPRESSION: No bowel obstruction, free air or free fluid. Normal appendix. Scattered stool. Fatty liver infiltration. Bilateral adrenal nodules. These of been present since at least January 2020 demonstrating long-term stability. Heterogeneous prostate. Wall thickening of the underdistended urinary bladder. Please correlate with any symptoms.   Patient treated with an NS bolus and was initially given broad-spectrum antibiotics for sepsis of unknown source. Hospitalist consulted for admission.   Assessment and Plan: * Urinary tract infection SIRS, possible sepsis Abdominal pain CT for the most part unrevealing but showing bladder wall thickening SIRS criteria include fever and tachycardia source of infection with leukocytosis Sepsis fluids Rocephin Follow cultures   HTN (hypertension) Continue home meds   OSA on CPAP CPAP  nightly   Paroxysmal atrial fibrillation (HCC) Continue flecainide, diltiazem and Eliquis   Diabetes mellitus without complication (HCC) Sliding scale insulin coverage Hemoglobin A1c 5.7, well-controlled.  # Hyponatremia, continue to monitor sodium level # Hypokalemia, potassium repleted. # Hypophosphatemia, Phos repleted. Monitor electrolytes and replete as needed.   Body mass index is 42.09 kg/m.  Interventions:  Diet: Heart healthy/carb modified diet DVT Prophylaxis: Eliquis  Advance goals of care discussion: Full code  Family Communication: family was not present at bedside, at the time of interview.  The pt provided permission to discuss medical plan with the family. Opportunity was given to ask question and all questions were answered satisfactorily.   Disposition:  Pt is from Home, admitted with SIRS due to UTI, still on IV abx, pending Ucx, which precludes a safe discharge. Discharge to Home, when stable in 1-2 days.  Subjective: No significant events overnight.  Patient is complaining of some headache and mild abdominal soreness, especially bilateral lower quadrants.  Denied any chest pain or palpitation, no shortness of breath.  No any other complaints.  Physical Exam: General: NAD, lying comfortably Appear in no distress, affect appropriate Eyes: PERRLA ENT: Oral Mucosa Clear, moist  Neck: no JVD,  Cardiovascular: S1 and S2 Present, no Murmur,  Respiratory: good respiratory effort, Bilateral Air entry equal and Decreased, no Crackles, no wheezes Abdomen: Bowel Sound present, Soft and b/l LQ tenderness,  Skin: no rashes Extremities: no Pedal edema, no calf tenderness Neurologic: without any new focal findings Gait not checked due to patient safety concerns  Vitals:   04/24/23 0230 04/24/23 0300 04/24/23 0358 04/24/23 0742  BP: 131/67 (!) 132/56 (!) 131/55 (!) 124/59  Pulse: 87 84 91 85  Resp:   18  20  Temp:   98.4 F (36.9 C) 98 F (36.7 C)  TempSrc:       SpO2: 95% 95% 100% 98%  Weight:      Height:       No intake or output data in the 24 hours ending 04/24/23 1513 Filed Weights   04/23/23 1514  Weight: 129.3 kg    Data Reviewed: I have personally reviewed and interpreted daily labs, tele strips, imagings as discussed above. I reviewed all nursing notes, pharmacy notes, vitals, pertinent old records I have discussed plan of care as described above with RN and patient/family.  CBC: Recent Labs  Lab 04/23/23 1516  WBC 19.2*  NEUTROABS 15.5*  HGB 15.0  HCT 42.6  MCV 86.9  PLT 185   Basic Metabolic Panel: Recent Labs  Lab 04/23/23 1516 04/24/23 0441  NA 132* 133*  K 3.8 3.4*  CL 97* 102  CO2 25 24  GLUCOSE 159* 123*  BUN 9 10  CREATININE 0.78 0.75  CALCIUM 9.0 8.6*  MG  --  1.9  PHOS  --  1.9*    Studies: CT ABDOMEN PELVIS W CONTRAST Result Date: 04/23/2023 CLINICAL DATA:  Urinary retention with epigastric pain.  Nausea EXAM: CT ABDOMEN AND PELVIS WITH CONTRAST TECHNIQUE: Multidetector CT imaging of the abdomen and pelvis was performed using the standard protocol following bolus administration of intravenous contrast. RADIATION DOSE REDUCTION: This exam was performed according to the departmental dose-optimization program which includes automated exposure control, adjustment of the mA and/or kV according to patient size and/or use of iterative reconstruction technique. CONTRAST:  OMNIPAQUE IOHEXOL 350 MG/ML SOLN COMPARISON:  CT 03/17/2018. FINDINGS: Lower chest: Breathing motion at the lung bases. No pleural effusion or pneumothorax. Coronary artery calcifications are seen. Hepatobiliary: No space-occupying liver lesion. Patent portal vein. Gallbladder is nondilated. Pancreas: Unremarkable. No pancreatic ductal dilatation or surrounding inflammatory changes. Spleen: Normal in size without focal abnormality. Adrenals/Urinary Tract: Bilateral adrenal nodules are identified. These were seen previously. The left-sided  lesion today measures 2.3 by 1.8 cm. Hounsfield unit of 63 on portal venous phase. On delay 67. Not clearly an adenoma. On prior exam diameter lesion was 2.2 by 1.6 cm demonstrating almost 5 years of stability over 5 years stability consistent with a benign lesion. The right-sided lesion today measures 14 mm in maximal dimension. This has Hounsfield unit of 43 on portal venous phase and 20 on standard renal delay consistent with an adenoma. Bosniak 1 right-sided small renal cysts are identified. Largest measures 2.7 cm in diameter. No enhancing renal mass or collecting system dilatation. The ureters have normal course and caliber extending down to the urinary bladder. Bladder is underdistended with slight wall thickening. Stomach/Bowel: Large bowel has a normal course and caliber with scattered stool. Normal appendix. Stomach and small bowel are nondilated. Vascular/Lymphatic: Aortic atherosclerosis. No enlarged abdominal or pelvic lymph nodes. Reproductive: Mildly enlarged prostate. Other: Tiny fat containing umbilical hernias. No free air or free fluid. 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 Scattered degenerative changes of the spine and pelvis. Fixation hardware along the lumbar spine as well. IMPRESSION: No bowel obstruction, free air or free fluid. Normal appendix. Scattered stool. Fatty liver infiltration. Bilateral adrenal nodules. These of been present since at least January 2020 demonstrating long-term stability. Heterogeneous prostate. Wall thickening of the underdistended urinary bladder. Please correlate with any symptoms. Breathing motion Electronically Signed   By: Karen Kays M.D.   On: 04/23/2023 21:27   DG  Chest 2 View Result Date: 04/23/2023 CLINICAL DATA:  Chills and fever. EXAM: CHEST - 2 VIEW COMPARISON:  None Available. FINDINGS: The heart size and mediastinal contours are within normal limits. No focal consolidation, pleural effusion, or  pneumothorax. No acute osseous abnormality. IMPRESSION: No acute cardiopulmonary findings. Electronically Signed   By: Hart Robinsons M.D.   On: 04/23/2023 16:45    Scheduled Meds:  apixaban  5 mg Oral BID   diltiazem  120 mg Oral Daily   flecainide  100 mg Oral BID   gabapentin  300 mg Oral TID   insulin aspart  0-6 Units Subcutaneous TID WC   losartan  25 mg Oral Daily   pravastatin  40 mg Oral Daily   traZODone  100 mg Oral QHS   Continuous Infusions:  cefTRIAXone (ROCEPHIN)  IV 2 g (04/24/23 0447)   lactated ringers 150 mL/hr at 04/23/23 2207   lactated ringers 150 mL/hr (04/24/23 0421)   potassium PHOSPHATE IVPB (in mmol) 30 mmol (04/24/23 1041)   PRN Meds: acetaminophen **OR** acetaminophen, ondansetron **OR** ondansetron (ZOFRAN) IV, tiZANidine, traMADol  Time spent: 35 minutes  Author: Gillis Santa. MD [facility] 04/24/2023 3:13 PM  To reach On-call, see care teams to locate the attending and reach out to them via [email or web address]. If 7PM-7AM, please contact night-coverage If you still have difficulty reaching the attending provider, please page the [facility] (Director on Call) for [facility] on amion for assistance.

## 2023-04-24 NOTE — Plan of Care (Signed)

## 2023-04-25 DIAGNOSIS — N3001 Acute cystitis with hematuria: Secondary | ICD-10-CM | POA: Diagnosis not present

## 2023-04-25 LAB — CBC
HCT: 35.6 % — ABNORMAL LOW (ref 39.0–52.0)
Hemoglobin: 12.5 g/dL — ABNORMAL LOW (ref 13.0–17.0)
MCH: 30.6 pg (ref 26.0–34.0)
MCHC: 35.1 g/dL (ref 30.0–36.0)
MCV: 87.3 fL (ref 80.0–100.0)
Platelets: 152 10*3/uL (ref 150–400)
RBC: 4.08 MIL/uL — ABNORMAL LOW (ref 4.22–5.81)
RDW: 13.5 % (ref 11.5–15.5)
WBC: 7.6 10*3/uL (ref 4.0–10.5)
nRBC: 0 % (ref 0.0–0.2)

## 2023-04-25 LAB — PHOSPHORUS: Phosphorus: 2.8 mg/dL (ref 2.5–4.6)

## 2023-04-25 LAB — BASIC METABOLIC PANEL
Anion gap: 7 (ref 5–15)
BUN: 13 mg/dL (ref 8–23)
CO2: 25 mmol/L (ref 22–32)
Calcium: 8.6 mg/dL — ABNORMAL LOW (ref 8.9–10.3)
Chloride: 104 mmol/L (ref 98–111)
Creatinine, Ser: 0.87 mg/dL (ref 0.61–1.24)
GFR, Estimated: >60 mL/min (ref >60)
Glucose, Bld: 140 mg/dL — ABNORMAL HIGH (ref 70–99)
Potassium: 4 mmol/L (ref 3.5–5.1)
Sodium: 136 mmol/L (ref 135–145)

## 2023-04-25 LAB — GLUCOSE, CAPILLARY
Glucose-Capillary: 111 mg/dL — ABNORMAL HIGH (ref 70–99)
Glucose-Capillary: 142 mg/dL — ABNORMAL HIGH (ref 70–99)
Glucose-Capillary: 113 mg/dL — ABNORMAL HIGH (ref 70–99)
Glucose-Capillary: 102 mg/dL — ABNORMAL HIGH (ref 70–99)
Glucose-Capillary: 205 mg/dL — ABNORMAL HIGH (ref 70–99)

## 2023-04-25 LAB — MAGNESIUM: Magnesium: 2.1 mg/dL (ref 1.7–2.4)

## 2023-04-25 MED ORDER — DILTIAZEM HCL ER 120 MG PO TB24
120.0000 mg | ORAL_TABLET | Freq: Every day | ORAL | Status: DC
  Filled 2023-04-25: qty 1

## 2023-04-25 NOTE — Plan of Care (Signed)

## 2023-04-25 NOTE — Progress Notes (Signed)
[facility] Progress Note  Patient: Mario [name]    ZOX:[id number]  DOA: 04/23/2023     Date of Service: the patient was seen and examined on 04/25/2023  Chief Complaint  Patient presents with   Fever   Brief hospital course:  Mario [name] is a 73 y.o. male with medical history significant for Paroxysmal A-fib on Eliquis with history of ablation, HTN, DM, OSA and morbid obesity, being admitted with urinary tract infection and SIRS/possible sepsis. Patient presented with a 3-day history of abdominal pain, nausea, weakness and dysuria.  Denies fever, vomiting or diarrhea and denies cough or shortness of breath. ED course: Febrile to 101.2 and tachycardic to 103 with otherwise unremarkable vitals. Workup notable for WBC of 19,000 with lactic acid 1.7.  Urinalysis with trace leukocyte esterase and rare bacteria.  Respiratory viral panel negative.  Group A strep PCR negative EKG, Personally viewed and interpreted showing NSR at 94 with RBBB CT abdomen and pelvis mostly nonacute but showing wall thickening of the under distended urinary bladder as follows:  IMPRESSION: No bowel obstruction, free air or free fluid. Normal appendix. Scattered stool. Fatty liver infiltration. Bilateral adrenal nodules. These of been present since at least January 2020 demonstrating long-term stability. Heterogeneous prostate. Wall thickening of the underdistended urinary bladder. Please correlate with any symptoms.   Patient treated with an NS bolus and was initially given broad-spectrum antibiotics for sepsis of unknown source. Hospitalist consulted for admission.   Assessment and Plan: * Urinary tract infection Sepsis due to UTI Abdominal pain CT for the most part unrevealing but showing bladder wall thickening SIRS criteria include fever and tachycardia source of infection with leukocytosis Sepsis fluids Rocephin Urine culture growing GNR, ID and sensitivity report is pending    HTN  (hypertension) Continue home meds 2/15 low blood pressure, held Cardizem and losartan  Monitor BP and titrate medications accordingly  OSA on CPAP CPAP nightly   Paroxysmal atrial fibrillation (HCC) Continue flecainide, diltiazem and Eliquis 2/15 low blood pressure, held Cardizem and losartan    Diabetes mellitus without complication (HCC) Sliding scale insulin coverage Hemoglobin A1c 5.7, well-controlled.  # Hyponatremia, continue to monitor sodium level # Hypokalemia, potassium repleted. # Hypophosphatemia, Phos repleted. Monitor electrolytes and replete as needed.   Body mass index is 42.09 kg/m.  Interventions:  Diet: Heart healthy/carb modified diet DVT Prophylaxis: Eliquis  Advance goals of care discussion: Full code  Family Communication: family was not present at bedside, at the time of interview.  The pt provided permission to discuss medical plan with the family. Opportunity was given to ask question and all questions were answered satisfactorily.   Disposition:  Pt is from Home, admitted with SIRS due to UTI, still on IV abx, pending Ucx, which precludes a safe discharge. Discharge to Home, when stable, most likely DC tomorrow a.m.  Awaiting for urine culture final report, and BP is low, held antihypertensive medications for now. Plan is to discharge him tomorrow a.m.  Subjective: No significant events overnight.  Patient was sitting comfortably on the recliner, stated that he still has some tenderness in the bilateral lower quadrants of the abdomen otherwise no new complaints.  Physical Exam: General: NAD, lying comfortably Appear in no distress, affect appropriate Eyes: PERRLA ENT: Oral Mucosa Clear, moist  Neck: no JVD,  Cardiovascular: S1 and S2 Present, no Murmur,  Respiratory: good respiratory effort, Bilateral Air entry equal and Decreased, no Crackles, no wheezes Abdomen: Bowel Sound present, Soft and b/l LQ tenderness,  Skin: no  rashes Extremities: no Pedal edema, no calf tenderness Neurologic: without any new focal findings Gait not checked due to patient safety concerns  Vitals:   04/24/23 1611 04/24/23 1945 04/25/23 0342 04/25/23 0803  BP: (!) 106/56 (!) 121/53 (!) 105/33 (!) 93/48  Pulse: 60 63 60 (!) 59  Resp: 20 20 16 16   Temp: 97.7 F (36.5 C) 99.5 F (37.5 C) (!) 97.5 F (36.4 C) 98.7 F (37.1 C)  TempSrc: Oral  Oral Oral  SpO2: 97% 98% 100% 94%  Weight:      Height:        Intake/Output Summary (Last 24 hours) at 04/25/2023 1153 Last data filed at 04/25/2023 1100 Gross per 24 hour  Intake 0 ml  Output 920 ml  Net -920 ml   Filed Weights   04/23/23 1514  Weight: 129.3 kg    Data Reviewed: I have personally reviewed and interpreted daily labs, tele strips, imagings as discussed above. I reviewed all nursing notes, pharmacy notes, vitals, pertinent old records I have discussed plan of care as described above with RN and patient/family.  CBC: Recent Labs  Lab 04/23/23 1516 04/25/23 0639  WBC 19.2* 7.6  NEUTROABS 15.5*  --   HGB 15.0 12.5*  HCT 42.6 35.6*  MCV 86.9 87.3  PLT 185 152   Basic Metabolic Panel: Recent Labs  Lab 04/23/23 1516 04/24/23 0441 04/25/23 0639  NA 132* 133* 136  K 3.8 3.4* 4.0  CL 97* 102 104  CO2 25 24 25   GLUCOSE 159* 123* 140*  BUN 9 10 13   CREATININE 0.78 0.75 0.87  CALCIUM 9.0 8.6* 8.6*  MG  --  1.9 2.1  PHOS  --  1.9* 2.8    Studies: No results found.   Scheduled Meds:  apixaban  5 mg Oral BID   [START ON 04/26/2023] diltiazem  120 mg Oral Daily   flecainide  100 mg Oral BID   gabapentin  300 mg Oral TID   insulin aspart  0-6 Units Subcutaneous TID WC   losartan  25 mg Oral Daily   pravastatin  40 mg Oral Daily   traZODone  100 mg Oral QHS   Continuous Infusions:  cefTRIAXone (ROCEPHIN)  IV 2 g (04/25/23 0616)   PRN Meds: acetaminophen **OR** acetaminophen, ondansetron **OR** ondansetron (ZOFRAN) IV, tiZANidine, traMADol  Time  spent: 35 minutes  Author: Gillis Santa. MD [facility] 04/25/2023 11:53 AM  To reach On-call, see care teams to locate the attending and reach out to them via [email or web address]. If 7PM-7AM, please contact night-coverage If you still have difficulty reaching the attending provider, please page the [facility] (Director on Call) for [facility] on amion for assistance.

## 2023-04-26 DIAGNOSIS — N3001 Acute cystitis with hematuria: Secondary | ICD-10-CM | POA: Diagnosis not present

## 2023-04-26 LAB — CBC
HCT: 37.1 % — ABNORMAL LOW (ref 39.0–52.0)
Hemoglobin: 12.8 g/dL — ABNORMAL LOW (ref 13.0–17.0)
MCH: 30.2 pg (ref 26.0–34.0)
MCHC: 34.5 g/dL (ref 30.0–36.0)
MCV: 87.5 fL (ref 80.0–100.0)
Platelets: 178 10*3/uL (ref 150–400)
RBC: 4.24 MIL/uL (ref 4.22–5.81)
RDW: 13.2 % (ref 11.5–15.5)
WBC: 5.2 10*3/uL (ref 4.0–10.5)
nRBC: 0 % (ref 0.0–0.2)

## 2023-04-26 LAB — BASIC METABOLIC PANEL
Anion gap: 7 (ref 5–15)
BUN: 12 mg/dL (ref 8–23)
CO2: 24 mmol/L (ref 22–32)
Calcium: 8.4 mg/dL — ABNORMAL LOW (ref 8.9–10.3)
Chloride: 104 mmol/L (ref 98–111)
Creatinine, Ser: 0.87 mg/dL (ref 0.61–1.24)
GFR, Estimated: >60 mL/min (ref >60)
Glucose, Bld: 113 mg/dL — ABNORMAL HIGH (ref 70–99)
Potassium: 4 mmol/L (ref 3.5–5.1)
Sodium: 135 mmol/L (ref 135–145)

## 2023-04-26 LAB — GLUCOSE, CAPILLARY
Glucose-Capillary: 118 mg/dL — ABNORMAL HIGH (ref 70–99)
Glucose-Capillary: 119 mg/dL — ABNORMAL HIGH (ref 70–99)
Glucose-Capillary: 123 mg/dL — ABNORMAL HIGH (ref 70–99)
Glucose-Capillary: 135 mg/dL — ABNORMAL HIGH (ref 70–99)
Glucose-Capillary: 147 mg/dL — ABNORMAL HIGH (ref 70–99)

## 2023-04-26 LAB — URINE CULTURE: Culture: >=100000 — AB

## 2023-04-26 LAB — MAGNESIUM: Magnesium: 2.1 mg/dL (ref 1.7–2.4)

## 2023-04-26 LAB — PHOSPHORUS: Phosphorus: 3.1 mg/dL (ref 2.5–4.6)

## 2023-04-26 MED ORDER — DILTIAZEM HCL ER 120 MG PO TB24
120.0000 mg | ORAL_TABLET | Freq: Every day | ORAL | Status: DC
  Administered 2023-04-27: 120 mg via ORAL
  Filled 2023-04-26: qty 1

## 2023-04-26 MED ORDER — CEPHALEXIN 500 MG PO CAPS: 500.0000 mg | ORAL_CAPSULE | Freq: Three times a day (TID) | ORAL | 0 refills | Status: AC

## 2023-04-26 MED ORDER — LOSARTAN POTASSIUM 25 MG PO TABS
25.0000 mg | ORAL_TABLET | Freq: Every day | ORAL | Status: DC
  Administered 2023-04-27: 25 mg via ORAL
  Filled 2023-04-26: qty 1

## 2023-04-26 NOTE — TOC CM/SW Note (Signed)
Transition of Care [facility]) - Inpatient Brief Assessment   Patient Details  Name: Zaion Hreha MRN: [id number] Date of Birth: 1951-02-12  Transition of Care [facility]) CM/SW Contact:    Rodney Langton, RN Phone Number: 04/26/2023, 1:45 PM   Clinical Narrative:  Brief assessment done, has discharge orders in.  Noted that HHPT was recommended, however patient declined. Confirmed with patient that he does not want HH, wife will provide transportation home. No TOC needs.  Transition of Care Asessment: Insurance and Status: Insurance coverage has been reviewed Patient has primary care physician: Yes Home environment has been reviewed: Yes, wife Prior level of function:: Independent Prior/Current Home Services: No current home services Social Drivers of Health Review: SDOH reviewed no interventions necessary Readmission risk has been reviewed: Yes Transition of care needs: no transition of care needs at this time

## 2023-04-26 NOTE — Progress Notes (Signed)
Mobility Specialist - Progress Note   04/26/23 0939  Mobility  Activity Ambulated with assistance in hallway;Stood at bedside;Dangled on edge of bed  Level of Assistance Standby assist, set-up cues, supervision of patient - no hands on  Assistive Device Front wheel walker  Distance Ambulated (ft) 200 ft  Activity Response Tolerated well  Mobility Referral Yes  Mobility visit 1 Mobility  Mobility Specialist Start Time (ACUTE ONLY) [id number]  Mobility Specialist Stop Time (ACUTE ONLY) 0845  Mobility Specialist Time Calculation (min) (ACUTE ONLY) 24 min   Pt supine in bed on RA upon arrival. Pt completes bed mobility HHA + extra time. Pt STS and ambulates in hallway SBA. Pt returns to recliner with needs in reach and chair alarm activated.   Terrilyn Saver  Mobility Specialist  04/26/23 9:40 AM

## 2023-04-26 NOTE — Plan of Care (Signed)

## 2023-04-26 NOTE — Progress Notes (Signed)
[facility] Progress Note  Patient: Mario [name]    ONG:[id number]  DOA: 04/23/2023     Date of Service: the patient was seen and examined on 04/26/2023  Chief Complaint  Patient presents with   Fever   Brief hospital course:  Mario [name] is a 73 y.o. male with medical history significant for Paroxysmal A-fib on Eliquis with history of ablation, HTN, DM, OSA and morbid obesity, being admitted with urinary tract infection and SIRS/possible sepsis. Patient presented with a 3-day history of abdominal pain, nausea, weakness and dysuria.  Denies fever, vomiting or diarrhea and denies cough or shortness of breath. ED course: Febrile to 101.2 and tachycardic to 103 with otherwise unremarkable vitals. Workup notable for WBC of 19,000 with lactic acid 1.7.  Urinalysis with trace leukocyte esterase and rare bacteria.  Respiratory viral panel negative.  Group A strep PCR negative EKG, Personally viewed and interpreted showing NSR at 94 with RBBB CT abdomen and pelvis mostly nonacute but showing wall thickening of the under distended urinary bladder as follows:  IMPRESSION: No bowel obstruction, free air or free fluid. Normal appendix. Scattered stool. Fatty liver infiltration. Bilateral adrenal nodules. These of been present since at least January 2020 demonstrating long-term stability. Heterogeneous prostate. Wall thickening of the underdistended urinary bladder. Please correlate with any symptoms.   Patient treated with an NS bolus and was initially given broad-spectrum antibiotics for sepsis of unknown source. Hospitalist consulted for admission.   Assessment and Plan: * Urinary tract infection Sepsis due to UTI Abdominal pain CT for the most part unrevealing but showing bladder wall thickening SIRS criteria include fever and tachycardia source of infection with leukocytosis Sepsis fluids Rocephin Urine culture growing E. coli, resistant to ampicillin, sensitive to all other  antibiotics Patient was discharged on Keflex 500 mg p.o. 3 times daily for 5 days.  But patient had low-grade temp and feels having chills, mild dysuria.  So patient is scared to go home today, he would like to stay and go home tomorrow a.m.    HTN (hypertension) Continue home meds 2/15 low blood pressure, held Cardizem and losartan  Monitor BP and titrate medications accordingly  OSA on CPAP CPAP nightly   Paroxysmal atrial fibrillation (HCC) Continue flecainide, diltiazem and Eliquis 2/15 low blood pressure, held Cardizem and losartan    Diabetes mellitus without complication (HCC) Sliding scale insulin coverage Hemoglobin A1c 5.7, well-controlled.  # Hyponatremia, continue to monitor sodium level # Hypokalemia, potassium repleted. # Hypophosphatemia, Phos repleted. Monitor electrolytes and replete as needed.   Body mass index is 42.09 kg/m.  Interventions:  Diet: Heart healthy/carb modified diet DVT Prophylaxis: Eliquis  Advance goals of care discussion: Full code  Family Communication: family was not present at bedside, at the time of interview.  The pt provided permission to discuss medical plan with the family. Opportunity was given to ask question and all questions were answered satisfactorily.   Disposition:  Pt is from Home, admitted with SIRS due to UTI, still on IV abx, pending Ucx, which precludes a safe discharge. Discharge to Home, when stable, most likely DC tomorrow a.m.   Subjective: No significant events overnight.  Patient was involved in the recliner.  Initially patient agreed to go home then he had low-grade temperature and feeling chilly so he is scared to go home today, plan is to discharge him tomorrow a.m.   Physical Exam: General: NAD, lying comfortably Appear in no distress, affect appropriate Eyes: PERRLA ENT: Oral Mucosa Clear, moist  Neck:  no JVD,  Cardiovascular: S1 and S2 Present, no Murmur,  Respiratory: good respiratory effort,  Bilateral Air entry equal and Decreased, no Crackles, no wheezes Abdomen: Bowel Sound present, Soft and b/l LQ tenderness,  Skin: no rashes Extremities: no Pedal edema, no calf tenderness Neurologic: without any new focal findings Gait not checked due to patient safety concerns  Vitals:   04/26/23 0508 04/26/23 0719 04/26/23 1448 04/26/23 1448  BP: (!) 118/51 (!) 120/55 (!) 121/51 (!) 121/51  Pulse: (!) 57 (!) 58 (!) 58 (!) 58  Resp: 16 16 16 16   Temp: 98.6 F (37 C) 97.9 F (36.6 C) 99.1 F (37.3 C) 99.1 F (37.3 C)  TempSrc:      SpO2: 93% 95% 98% 98%  Weight:      Height:       No intake or output data in the 24 hours ending 04/26/23 1521  Filed Weights   04/23/23 1514  Weight: 129.3 kg    Data Reviewed: I have personally reviewed and interpreted daily labs, tele strips, imagings as discussed above. I reviewed all nursing notes, pharmacy notes, vitals, pertinent old records I have discussed plan of care as described above with RN and patient/family.  CBC: Recent Labs  Lab 04/23/23 1516 04/25/23 0639 04/26/23 0546  WBC 19.2* 7.6 5.2  NEUTROABS 15.5*  --   --   HGB 15.0 12.5* 12.8*  HCT 42.6 35.6* 37.1*  MCV 86.9 87.3 87.5  PLT 185 152 178   Basic Metabolic Panel: Recent Labs  Lab 04/23/23 1516 04/24/23 0441 04/25/23 0639 04/26/23 0546  NA 132* 133* 136 135  K 3.8 3.4* 4.0 4.0  CL 97* 102 104 104  CO2 25 24 25 24   GLUCOSE 159* 123* 140* 113*  BUN 9 10 13 12   CREATININE 0.78 0.75 0.87 0.87  CALCIUM 9.0 8.6* 8.6* 8.4*  MG  --  1.9 2.1 2.1  PHOS  --  1.9* 2.8 3.1    Studies: No results found.   Scheduled Meds:  apixaban  5 mg Oral BID   [START ON 04/27/2023] diltiazem  120 mg Oral Daily   flecainide  100 mg Oral BID   gabapentin  300 mg Oral TID   insulin aspart  0-6 Units Subcutaneous TID WC   [START ON 04/27/2023] losartan  25 mg Oral Daily   pravastatin  40 mg Oral Daily   traZODone  100 mg Oral QHS   Continuous Infusions:  cefTRIAXone  (ROCEPHIN)  IV 2 g (04/26/23 0612)   PRN Meds: acetaminophen **OR** acetaminophen, ondansetron **OR** ondansetron (ZOFRAN) IV, tiZANidine, traMADol  Time spent: 35 minutes  Author: Gillis Santa. MD [facility] 04/26/2023 3:21 PM  To reach On-call, see care teams to locate the attending and reach out to them via [email or web address]. If 7PM-7AM, please contact night-coverage If you still have difficulty reaching the attending provider, please page the [facility] (Director on Call) for [facility] on amion for assistance.

## 2023-04-27 DIAGNOSIS — N3001 Acute cystitis with hematuria: Secondary | ICD-10-CM | POA: Diagnosis not present

## 2023-04-27 LAB — BASIC METABOLIC PANEL
Anion gap: 8 (ref 5–15)
BUN: 11 mg/dL (ref 8–23)
CO2: 24 mmol/L (ref 22–32)
Calcium: 8.4 mg/dL — ABNORMAL LOW (ref 8.9–10.3)
Chloride: 102 mmol/L (ref 98–111)
Creatinine, Ser: 0.77 mg/dL (ref 0.61–1.24)
GFR, Estimated: >60 mL/min (ref >60)
Glucose, Bld: 123 mg/dL — ABNORMAL HIGH (ref 70–99)
Potassium: 3.9 mmol/L (ref 3.5–5.1)
Sodium: 134 mmol/L — ABNORMAL LOW (ref 135–145)

## 2023-04-27 LAB — CBC
HCT: 38.1 % — ABNORMAL LOW (ref 39.0–52.0)
Hemoglobin: 13.1 g/dL (ref 13.0–17.0)
MCH: 30 pg (ref 26.0–34.0)
MCHC: 34.4 g/dL (ref 30.0–36.0)
MCV: 87.4 fL (ref 80.0–100.0)
Platelets: 177 10*3/uL (ref 150–400)
RBC: 4.36 MIL/uL (ref 4.22–5.81)
RDW: 12.9 % (ref 11.5–15.5)
WBC: 4.4 10*3/uL (ref 4.0–10.5)
nRBC: 0 % (ref 0.0–0.2)

## 2023-04-27 LAB — GLUCOSE, CAPILLARY
Glucose-Capillary: 118 mg/dL — ABNORMAL HIGH (ref 70–99)
Glucose-Capillary: 128 mg/dL — ABNORMAL HIGH (ref 70–99)
Glucose-Capillary: 151 mg/dL — ABNORMAL HIGH (ref 70–99)

## 2023-04-27 LAB — PHOSPHORUS: Phosphorus: 3.3 mg/dL (ref 2.5–4.6)

## 2023-04-27 LAB — MAGNESIUM: Magnesium: 2.1 mg/dL (ref 1.7–2.4)

## 2023-04-27 NOTE — Plan of Care (Signed)

## 2023-04-27 NOTE — Care Management Important Message (Signed)
Important Message  Patient Details  Name: Mario [name] MRN: [id number] Date of Birth: 1950-09-25   Important Message Given:  Yes - Medicare IM     [name], CMA 04/27/2023, 10:10 AM

## 2023-04-28 LAB — CULTURE, BLOOD (ROUTINE X 2)
Culture: NO GROWTH
Culture: NO GROWTH

## 2023-04-28 NOTE — Discharge Summary (Signed)
[facility] Discharge Summary   Patient: Mario [name] WUX:[id number]  PCP: Mario Nurse, MD  Date of admission: 04/23/2023   Date of discharge: 04/27/2023      Discharge Diagnoses:  Principal Problem:   Urinary tract infection Active Problems:   SIRS (systemic inflammatory response syndrome) (HCC)   Diabetes mellitus without complication (HCC)   Paroxysmal atrial fibrillation (HCC)   OSA on CPAP   HTN (hypertension)   Admitted From: Home Disposition:  Home   Recommendations for Outpatient Follow-up:  PCP: In 1 week Follow up LABS/TEST:     Follow-up Information     Mario Nurse, MD. Schedule an appointment as soon as possible for a visit in 1 week(s).   Specialty: Internal Medicine Contact information: [address] [facility] [place] [address] [phone]                Diet recommendation: Cardiac and Carb modified diet  Activity: The patient is advised to gradually reintroduce usual activities, as tolerated  Discharge Condition: stable  Code Status: Full code   History of present illness: As per the H and P dictated on admission Hospital Course:  Mario [name] is a 73 y.o. male with medical history significant for Paroxysmal A-fib on Eliquis with history of ablation, HTN, DM, OSA and morbid obesity, being admitted with urinary tract infection and SIRS/possible sepsis. Patient presented with a 3-day history of abdominal pain, nausea, weakness and dysuria.  Denies fever, vomiting or diarrhea and denies cough or shortness of breath. ED course: Febrile to 101.2 and tachycardic to 103 with otherwise unremarkable vitals. Workup notable for WBC of 19,000 with lactic acid 1.7.  Urinalysis with trace leukocyte esterase and rare bacteria.  Respiratory viral panel negative.  Group A strep PCR negative EKG, Personally viewed and interpreted showing NSR at 94 with RBBB CT abdomen and pelvis mostly nonacute but showing wall thickening of the  under distended urinary bladder as follows:  IMPRESSION: No bowel obstruction, free air or free fluid. Normal appendix. Scattered stool. Fatty liver infiltration. Bilateral adrenal nodules. These of been present since at least January 2020 demonstrating long-term stability. Heterogeneous prostate. Wall thickening of the underdistended urinary bladder. Please correlate with any symptoms.   Patient treated with an NS bolus and was initially given broad-spectrum antibiotics for sepsis of unknown source. Hospitalist consulted for admission.    Assessment and Plan: # Urinary tract infection # Sepsis due to UTI # Abdominal pain, Resolved  CT for the most part unrevealing but showing bladder wall thickening SIRS criteria include fever and tachycardia source of infection with leukocytosis. S/p Sepsis fluids, and Rocephin Urine culture growing E. coli, resistant to ampicillin, sensitive to all other antibiotics. Patient was discharged on Keflex 500 mg p.o. 3 times daily for 5 days.  # HTN (hypertension) Continue home meds. On 2/15 low blood pressure, held Cardizem and losartan.  BP improved.  Resumed home medications.  Patient was advised to monitor BP at home and follow with PCP to titrate medication accordingly. # OSA on CPAP. CPAP nightly # Paroxysmal atrial fibrillation. Continue flecainide, diltiazem and Eliquis 2/15 low blood pressure, held Cardizem and losartan, BP improved and resumed home medications.  Patient was advised to monitor BP at home and follow with PCP. # Diabetes mellitus without complication. Sliding scale insulin coverage Hemoglobin A1c 5.7, well-controlled. # Hyponatremia, continue to monitor sodium level # Hypokalemia, potassium repleted. # Hypophosphatemia, Phos repleted. Monitor electrolytes and replete as needed.  Body mass index is  42.09 kg/m.  Nutrition Interventions:  - Patient was instructed, not to drive, operate heavy machinery, perform activities at heights,  swimming or participation in water activities or provide baby sitting services while on Pain, Sleep and Anxiety Medications; until his outpatient Physician has advised to do so again.  - Also recommended to not to take more than prescribed Pain, Sleep and Anxiety Medications.  Patient was ambulatory without any assistance. On the day of the discharge the patient's vitals were stable, and no other acute medical condition were reported by patient. the patient was felt safe to be discharge at Home.  Consultants: None Procedures: None  Discharge Exam: General: Appear in no distress, no Rash; Oral Mucosa Clear, moist. Cardiovascular: S1 and S2 Present, no Murmur, Respiratory: normal respiratory effort, Bilateral Air entry present and no Crackles, no wheezes Abdomen: Bowel Sound present, Soft and no tenderness, no hernia Extremities: no Pedal edema, no calf tenderness Neurology: alert and oriented to time, place, and person affect appropriate.  Filed Weights   04/23/23 1514  Weight: 129.3 kg   Vitals:   04/27/23 0511 04/27/23 0748  BP: 136/82 (!) 109/54  Pulse: (!) 56 (!) 52  Resp: 18 16  Temp: 98.6 F (37 C) 98.6 F (37 C)  SpO2: 95% 98%    DISCHARGE MEDICATION: Allergies as of 04/27/2023       Reactions   Levaquin [levofloxacin] Itching   Red lines        Medication List     STOP taking these medications    Magnesium 400 MG Caps   meloxicam 15 MG tablet Commonly known as: MOBIC   pravastatin 40 MG tablet Commonly known as: PRAVACHOL   traMADol 50 MG tablet Commonly known as: ULTRAM       TAKE these medications    cephALEXin 500 MG capsule Commonly known as: KEFLEX Take 1 capsule (500 mg total) by mouth 3 (three) times daily for 5 days.   diltiazem 120 MG 24 hr capsule Commonly known as: TIAZAC Take 120 mg by mouth daily.   Eliquis 5 MG Tabs tablet Generic drug: apixaban Take 5 mg by mouth 2 (two) times daily.   flecainide 100 MG tablet Commonly  known as: TAMBOCOR Take 100 mg by mouth 2 (two) times daily.   gabapentin 300 MG capsule Commonly known as: NEURONTIN Take 300 mg by mouth 3 (three) times daily.   glimepiride 4 MG tablet Commonly known as: AMARYL Take 4 mg by mouth daily with breakfast.   losartan 25 MG tablet Commonly known as: COZAAR Take 25 mg by mouth daily.   metFORMIN 1000 MG tablet Commonly known as: GLUCOPHAGE Take 1,000 mg by mouth 2 (two) times daily with a meal.   tiZANidine 4 MG tablet Commonly known as: ZANAFLEX Take 4 mg by mouth every 8 (eight) hours as needed for muscle spasms.   traZODone 100 MG tablet Commonly known as: DESYREL Take 100 mg by mouth at bedtime.   VICKS VAPORUB EX Place 1 application into both nostrils at bedtime as needed (congestion).       Allergies  Allergen Reactions   Levaquin [Levofloxacin] Itching    Red lines   Discharge Instructions     Call MD for:  difficulty breathing, headache or visual disturbances   Complete by: As directed    Call MD for:  extreme fatigue   Complete by: As directed    Call MD for:  persistant dizziness or light-headedness   Complete by: As directed  Call MD for:  persistant nausea and vomiting   Complete by: As directed    Call MD for:  severe uncontrolled pain   Complete by: As directed    Call MD for:  temperature >100.4   Complete by: As directed    Diet - low sodium heart healthy   Complete by: As directed    Discharge instructions   Complete by: As directed    Follow-up with PCP in 1 week   Increase activity slowly   Complete by: As directed        The results of significant diagnostics from this hospitalization (including imaging, microbiology, ancillary and laboratory) are listed below for reference.    Significant Diagnostic Studies: CT ABDOMEN PELVIS W CONTRAST Result Date: 04/23/2023 CLINICAL DATA:  Urinary retention with epigastric pain.  Nausea EXAM: CT ABDOMEN AND PELVIS WITH CONTRAST TECHNIQUE:  Multidetector CT imaging of the abdomen and pelvis was performed using the standard protocol following bolus administration of intravenous contrast. RADIATION DOSE REDUCTION: This exam was performed according to the departmental dose-optimization program which includes automated exposure control, adjustment of the mA and/or kV according to patient size and/or use of iterative reconstruction technique. CONTRAST:  OMNIPAQUE IOHEXOL 350 MG/ML SOLN COMPARISON:  CT 03/17/2018. FINDINGS: Lower chest: Breathing motion at the lung bases. No pleural effusion or pneumothorax. Coronary artery calcifications are seen. Hepatobiliary: No space-occupying liver lesion. Patent portal vein. Gallbladder is nondilated. Pancreas: Unremarkable. No pancreatic ductal dilatation or surrounding inflammatory changes. Spleen: Normal in size without focal abnormality. Adrenals/Urinary Tract: Bilateral adrenal nodules are identified. These were seen previously. The left-sided lesion today measures 2.3 by 1.8 cm. Hounsfield unit of 63 on portal venous phase. On delay 82. Not clearly an adenoma. On prior exam diameter lesion was 2.2 by 1.6 cm demonstrating almost 5 years of stability over 5 years stability consistent with a benign lesion. The right-sided lesion today measures 14 mm in maximal dimension. This has Hounsfield unit of 43 on portal venous phase and 20 on standard renal delay consistent with an adenoma. Bosniak 1 right-sided small renal cysts are identified. Largest measures 2.7 cm in diameter. No enhancing renal mass or collecting system dilatation. The ureters have normal course and caliber extending down to the urinary bladder. Bladder is underdistended with slight wall thickening. Stomach/Bowel: Large bowel has a normal course and caliber with scattered stool. Normal appendix. Stomach and small bowel are nondilated. Vascular/Lymphatic: Aortic atherosclerosis. No enlarged abdominal or pelvic lymph nodes. Reproductive: Mildly  enlarged prostate. Other: Tiny fat containing umbilical hernias. No free air or free fluid. 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 Scattered degenerative changes of the spine and pelvis. Fixation hardware along the lumbar spine as well. IMPRESSION: No bowel obstruction, free air or free fluid. Normal appendix. Scattered stool. Fatty liver infiltration. Bilateral adrenal nodules. These of been present since at least January 2020 demonstrating long-term stability. Heterogeneous prostate. Wall thickening of the underdistended urinary bladder. Please correlate with any symptoms. Breathing motion Electronically Signed   By: Karen Kays M.D.   On: 04/23/2023 21:27   DG Chest 2 View Result Date: 04/23/2023 CLINICAL DATA:  Chills and fever. EXAM: CHEST - 2 VIEW COMPARISON:  None Available. FINDINGS: The heart size and mediastinal contours are within normal limits. No focal consolidation, pleural effusion, or pneumothorax. No acute osseous abnormality. IMPRESSION: No acute cardiopulmonary findings. Electronically Signed   By: Hart Robinsons M.D.   On: 04/23/2023 16:45    Microbiology: Recent Results (  from the past 240 hours)  Culture, blood (Routine x 2)     Status: None   Collection Time: 04/23/23  3:16 PM   Specimen: BLOOD  Result Value Ref Range Status   Specimen Description BLOOD LEFT ANTECUBITAL  Final   Special Requests   Final    BOTTLES DRAWN AEROBIC AND ANAEROBIC Blood Culture results may not be optimal due to an inadequate volume of blood received in culture bottles   Culture   Final    NO GROWTH 5 DAYS Performed at [facility], [address] [place], [place] [address]    Report Status 04/28/2023 FINAL  Final  Urine Culture     Status: Abnormal   Collection Time: 04/23/23  3:16 PM   Specimen: Urine, Random  Result Value Ref Range Status   Specimen Description   Final    URINE, RANDOM Performed at [facility], [address], [place], [place] [address]    Special Requests   Final    NONE Reflexed from [id number] Performed at [facility], [address] [place], [place] [address]    Culture >=100,000 COLONIES/mL ESCHERICHIA COLI (A)  Final   Report Status 04/26/2023 FINAL  Final   Organism ID, Bacteria ESCHERICHIA COLI (A)  Final      Susceptibility   Escherichia coli - MIC*    AMPICILLIN >=32 RESISTANT Resistant     CEFAZOLIN <=4 SENSITIVE Sensitive     CEFEPIME <=0.12 SENSITIVE Sensitive     CEFTRIAXONE <=0.25 SENSITIVE Sensitive     CIPROFLOXACIN <=0.25 SENSITIVE Sensitive     GENTAMICIN <=1 SENSITIVE Sensitive     IMIPENEM <=0.25 SENSITIVE Sensitive     NITROFURANTOIN <=16 SENSITIVE Sensitive     TRIMETH/SULFA <=20 SENSITIVE Sensitive     AMPICILLIN/SULBACTAM 16 INTERMEDIATE Intermediate     PIP/TAZO <=4 SENSITIVE Sensitive ug/mL    * >=100,000 COLONIES/mL ESCHERICHIA COLI  Resp panel by RT-PCR (RSV, Flu A&B, Covid) Anterior Nasal Swab     Status: None   Collection Time: 04/23/23  3:22 PM   Specimen: Anterior Nasal Swab  Result Value Ref Range Status   SARS Coronavirus 2 by RT PCR NEGATIVE NEGATIVE Final    Comment: (NOTE) SARS-CoV-2 target nucleic acids are NOT DETECTED.  The SARS-CoV-2 RNA is generally detectable in upper respiratory specimens during the acute phase of infection. The lowest concentration of SARS-CoV-2 viral copies this assay can detect is 138 copies/mL. A negative result does not preclude SARS-Cov-2 infection and should not be used as the sole basis for treatment or other patient management decisions. A negative result may occur with  improper specimen collection/handling, submission of specimen other than nasopharyngeal swab, presence of viral mutation(s) within the areas targeted by this assay, and inadequate number of viral copies(<138 copies/mL). A negative result must be combined with clinical observations, patient history, and epidemiological information.  The expected result is Negative.  Fact Sheet for Patients:  BloggerCourse.com  Fact Sheet for Healthcare Providers:  SeriousBroker.it  This test is no t yet approved or cleared by the [place] FDA and  has been authorized for detection and/or diagnosis of SARS-CoV-2 by FDA under an Emergency Use Authorization (EUA). This EUA will remain  in effect (meaning this test can be used) for the duration of the COVID-19 declaration under Section 564(b)(1) of the Act, 21 U.S.C.section 360bbb-3(b)(1), unless the authorization is terminated  or revoked sooner.       Influenza A by PCR NEGATIVE NEGATIVE Final  Influenza B by PCR NEGATIVE NEGATIVE Final    Comment: (NOTE) The Xpert Xpress SARS-CoV-2/FLU/RSV plus assay is intended as an aid in the diagnosis of influenza from Nasopharyngeal swab specimens and should not be used as a sole basis for treatment. Nasal washings and aspirates are unacceptable for Xpert Xpress SARS-CoV-2/FLU/RSV testing.  Fact Sheet for Patients: BloggerCourse.com  Fact Sheet for Healthcare Providers: SeriousBroker.it  This test is not yet approved or cleared by the [place] FDA and has been authorized for detection and/or diagnosis of SARS-CoV-2 by FDA under an Emergency Use Authorization (EUA). This EUA will remain in effect (meaning this test can be used) for the duration of the COVID-19 declaration under Section 564(b)(1) of the Act, 21 U.S.C. section 360bbb-3(b)(1), unless the authorization is terminated or revoked.     Resp Syncytial Virus by PCR NEGATIVE NEGATIVE Final    Comment: (NOTE) Fact Sheet for Patients: BloggerCourse.com  Fact Sheet for Healthcare Providers: SeriousBroker.it  This test is not yet approved or cleared by the [place] FDA and has been authorized for detection and/or  diagnosis of SARS-CoV-2 by FDA under an Emergency Use Authorization (EUA). This EUA will remain in effect (meaning this test can be used) for the duration of the COVID-19 declaration under Section 564(b)(1) of the Act, 21 U.S.C. section 360bbb-3(b)(1), unless the authorization is terminated or revoked.  Performed at [facility], [address] [place], [place] [address]   Group A Strep by PCR [facility] Only)     Status: None   Collection Time: 04/23/23  3:22 PM   Specimen: Throat; Sterile Swab  Result Value Ref Range Status   Group A Strep by PCR NOT DETECTED NOT DETECTED Final    Comment: Performed at [facility], [address] [place], [place] [address]  Culture, blood (Routine x 2)     Status: None   Collection Time: 04/23/23  8:31 PM   Specimen: BLOOD  Result Value Ref Range Status   Specimen Description BLOOD RIGHT ANTECUBITAL  Final   Special Requests   Final    BOTTLES DRAWN AEROBIC AND ANAEROBIC Blood Culture results may not be optimal due to an inadequate volume of blood received in culture bottles   Culture   Final    NO GROWTH 5 DAYS Performed at [facility], [address] [place], [place] [address]    Report Status 04/28/2023 FINAL  Final     Labs: CBC: Recent Labs  Lab 04/23/23 1516 04/25/23 0639 04/26/23 0546 04/27/23 0659  WBC 19.2* 7.6 5.2 4.4  NEUTROABS 15.5*  --   --   --   HGB 15.0 12.5* 12.8* 13.1  HCT 42.6 35.6* 37.1* 38.1*  MCV 86.9 87.3 87.5 87.4  PLT 185 152 178 177   Basic Metabolic Panel: Recent Labs  Lab 04/23/23 1516 04/24/23 0441 04/25/23 0639 04/26/23 0546 04/27/23 0659  NA 132* 133* 136 135 134*  K 3.8 3.4* 4.0 4.0 3.9  CL 97* 102 104 104 102  CO2 25 24 25 24 24   GLUCOSE 159* 123* 140* 113* 123*  BUN 9 10 13 12 11   CREATININE 0.78 0.75 0.87 0.87 0.77  CALCIUM 9.0 8.6* 8.6* 8.4* 8.4*  MG  --  1.9 2.1 2.1 2.1  PHOS  --  1.9* 2.8 3.1 3.3   Liver Function Tests: Recent Labs  Lab  04/23/23 1516  AST 19  ALT 19  ALKPHOS 83  BILITOT 1.2  PROT 8.1  ALBUMIN 3.7  No results for input(s): "LIPASE", "AMYLASE" in the last 168 hours. No results for input(s): "AMMONIA" in the last 168 hours. Cardiac Enzymes: No results for input(s): "CKTOTAL", "CKMB", "CKMBINDEX", "TROPONINI" in the last 168 hours. BNP (last 3 results) No results for input(s): "BNP" in the last 8760 hours. CBG: Recent Labs  Lab 04/26/23 1613 04/26/23 2149 04/27/23 0015 04/27/23 0506 04/27/23 0724  GLUCAP 123* 118* 151* 128* 118*    Time spent: 35 minutes  Signed:  Gillis Santa  [facility] 04/27/2023  04:15 PM

## 2023-06-10 ENCOUNTER — Other Ambulatory Visit: Payer: Self-pay | Admitting: Physician Assistant

## 2023-06-10 DIAGNOSIS — R1032 Left lower quadrant pain: Secondary | ICD-10-CM

## 2023-06-12 ENCOUNTER — Other Ambulatory Visit: Payer: Self-pay | Admitting: Physician Assistant

## 2023-06-12 DIAGNOSIS — I10 Essential (primary) hypertension: Secondary | ICD-10-CM

## 2023-06-12 DIAGNOSIS — R079 Chest pain, unspecified: Secondary | ICD-10-CM

## 2023-06-12 DIAGNOSIS — E785 Hyperlipidemia, unspecified: Secondary | ICD-10-CM

## 2023-06-12 DIAGNOSIS — I209 Angina pectoris, unspecified: Secondary | ICD-10-CM

## 2023-06-19 ENCOUNTER — Telehealth (HOSPITAL_COMMUNITY): Payer: Self-pay | Admitting: *Deleted

## 2023-06-19 MED ORDER — METOPROLOL TARTRATE 100 MG PO TABS: ORAL_TABLET | ORAL | 0 refills | Status: AC

## 2023-06-19 NOTE — Telephone Encounter (Signed)
 Reaching out to patient to offer assistance regarding upcoming cardiac imaging study; pt verbalizes understanding of appt date/time, parking situation and where to check in, pre-test NPO status and medications ordered, and verified current allergies; name and call back number provided for further questions should they arise Johney Frame RN Navigator Cardiac Imaging [facility] [phone] office [phone] cell

## 2023-06-22 ENCOUNTER — Ambulatory Visit
Admission: RE | Admit: 2023-06-22 | Discharge: 2023-06-22 | Disposition: A | Discharge status: Home / Self Care | Source: Ambulatory Visit | Attending: Physician Assistant | Admitting: Physician Assistant

## 2023-06-22 DIAGNOSIS — I10 Essential (primary) hypertension: Secondary | ICD-10-CM | POA: Diagnosis present

## 2023-06-22 DIAGNOSIS — E785 Hyperlipidemia, unspecified: Secondary | ICD-10-CM | POA: Diagnosis present

## 2023-06-22 DIAGNOSIS — R079 Chest pain, unspecified: Secondary | ICD-10-CM | POA: Diagnosis present

## 2023-06-22 DIAGNOSIS — I209 Angina pectoris, unspecified: Secondary | ICD-10-CM | POA: Diagnosis present

## 2023-06-22 MED ORDER — IOHEXOL 350 MG/ML SOLN
80.0000 mL | Freq: Once | INTRAVENOUS | Status: AC | PRN
  Administered 2023-06-22: 80 mL via INTRAVENOUS

## 2023-06-22 MED ORDER — DILTIAZEM HCL 25 MG/5ML IV SOLN
10.0000 mg | INTRAVENOUS | Status: DC | PRN
  Filled 2023-06-22: qty 5

## 2023-06-22 MED ORDER — NITROGLYCERIN 0.4 MG SL SUBL
SUBLINGUAL_TABLET | SUBLINGUAL | Status: AC
  Filled 2023-06-22: qty 2

## 2023-06-22 MED ORDER — METOPROLOL TARTRATE 5 MG/5ML IV SOLN
10.0000 mg | Freq: Once | INTRAVENOUS | Status: DC | PRN
  Filled 2023-06-22: qty 10

## 2023-06-22 MED ORDER — NITROGLYCERIN 0.4 MG SL SUBL
0.8000 mg | SUBLINGUAL_TABLET | Freq: Once | SUBLINGUAL | Status: AC
  Administered 2023-06-22: 0.8 mg via SUBLINGUAL
  Filled 2023-06-22: qty 25

## 2023-11-10 ENCOUNTER — Encounter: Payer: Self-pay | Admitting: Internal Medicine

## 2023-11-18 ENCOUNTER — Encounter: Payer: Self-pay | Admitting: Internal Medicine

## 2023-11-18 ENCOUNTER — Ambulatory Visit: Admitting: General Practice

## 2023-11-18 ENCOUNTER — Encounter
Admission: RE | Disposition: A | Discharge status: Home / Self Care | Payer: Self-pay | Source: Home / Self Care | Attending: Internal Medicine

## 2023-11-18 ENCOUNTER — Ambulatory Visit
Admission: RE | Admit: 2023-11-18 | Discharge: 2023-11-18 | Disposition: A | Discharge status: Home / Self Care | Attending: Internal Medicine | Admitting: Internal Medicine

## 2023-11-18 DIAGNOSIS — K7460 Unspecified cirrhosis of liver: Secondary | ICD-10-CM | POA: Diagnosis not present

## 2023-11-18 DIAGNOSIS — K2950 Unspecified chronic gastritis without bleeding: Secondary | ICD-10-CM | POA: Insufficient documentation

## 2023-11-18 DIAGNOSIS — E119 Type 2 diabetes mellitus without complications: Secondary | ICD-10-CM | POA: Insufficient documentation

## 2023-11-18 DIAGNOSIS — G4733 Obstructive sleep apnea (adult) (pediatric): Secondary | ICD-10-CM | POA: Diagnosis not present

## 2023-11-18 DIAGNOSIS — F419 Anxiety disorder, unspecified: Secondary | ICD-10-CM | POA: Insufficient documentation

## 2023-11-18 DIAGNOSIS — Z1211 Encounter for screening for malignant neoplasm of colon: Secondary | ICD-10-CM | POA: Insufficient documentation

## 2023-11-18 DIAGNOSIS — K5909 Other constipation: Secondary | ICD-10-CM | POA: Diagnosis not present

## 2023-11-18 DIAGNOSIS — I480 Paroxysmal atrial fibrillation: Secondary | ICD-10-CM | POA: Diagnosis not present

## 2023-11-18 DIAGNOSIS — Z7901 Long term (current) use of anticoagulants: Secondary | ICD-10-CM | POA: Insufficient documentation

## 2023-11-18 DIAGNOSIS — K449 Diaphragmatic hernia without obstruction or gangrene: Secondary | ICD-10-CM | POA: Insufficient documentation

## 2023-11-18 DIAGNOSIS — G894 Chronic pain syndrome: Secondary | ICD-10-CM | POA: Diagnosis not present

## 2023-11-18 DIAGNOSIS — K640 First degree hemorrhoids: Secondary | ICD-10-CM | POA: Insufficient documentation

## 2023-11-18 DIAGNOSIS — D123 Benign neoplasm of transverse colon: Secondary | ICD-10-CM | POA: Insufficient documentation

## 2023-11-18 DIAGNOSIS — F32A Depression, unspecified: Secondary | ICD-10-CM | POA: Diagnosis not present

## 2023-11-18 DIAGNOSIS — Z7984 Long term (current) use of oral hypoglycemic drugs: Secondary | ICD-10-CM | POA: Diagnosis not present

## 2023-11-18 DIAGNOSIS — E785 Hyperlipidemia, unspecified: Secondary | ICD-10-CM | POA: Insufficient documentation

## 2023-11-18 DIAGNOSIS — K30 Functional dyspepsia: Secondary | ICD-10-CM | POA: Diagnosis not present

## 2023-11-18 DIAGNOSIS — I10 Essential (primary) hypertension: Secondary | ICD-10-CM | POA: Insufficient documentation

## 2023-11-18 DIAGNOSIS — K5730 Diverticulosis of large intestine without perforation or abscess without bleeding: Secondary | ICD-10-CM | POA: Insufficient documentation

## 2023-11-18 DIAGNOSIS — K219 Gastro-esophageal reflux disease without esophagitis: Secondary | ICD-10-CM | POA: Diagnosis not present

## 2023-11-18 HISTORY — PX: ESOPHAGOGASTRODUODENOSCOPY: SHX5428

## 2023-11-18 HISTORY — PX: COLONOSCOPY: SHX5424

## 2023-11-18 HISTORY — PX: POLYPECTOMY: SHX149

## 2023-11-18 LAB — GLUCOSE, CAPILLARY: Glucose-Capillary: 150 mg/dL — ABNORMAL HIGH (ref 70–99)

## 2023-11-18 SURGERY — COLONOSCOPY
Anesthesia: General

## 2023-11-18 MED ORDER — PROPOFOL 10 MG/ML IV BOLUS
INTRAVENOUS | Status: DC | PRN
  Administered 2023-11-18: 30 mg via INTRAVENOUS
  Administered 2023-11-18 (×2): 20 mg via INTRAVENOUS
  Administered 2023-11-18: 40 mg via INTRAVENOUS
  Administered 2023-11-18 (×2): 20 mg via INTRAVENOUS
  Administered 2023-11-18: 50 mg via INTRAVENOUS
  Administered 2023-11-18: 20 mg via INTRAVENOUS

## 2023-11-18 MED ORDER — LIDOCAINE HCL (PF) 2 % IJ SOLN
INTRAMUSCULAR | Status: DC | PRN
  Administered 2023-11-18: 40 mg via INTRADERMAL

## 2023-11-18 MED ORDER — SODIUM CHLORIDE 0.9 % IV SOLN
INTRAVENOUS | Status: DC
  Administered 2023-11-18: 20 mL/h via INTRAVENOUS

## 2023-11-18 NOTE — Op Note (Signed)
 [facility] Gastroenterology Patient Name: Mario [name] Procedure Date: 11/18/2023 9:01 AM MRN: [id number] Account #: [id number] Date of Birth: Jan 23, 1951 Admit Type: Outpatient Age: 73 Room: [facility] ENDO ROOM 2 Gender: Male Note Status: Finalized Instrument Name: Colon Scope [id number] Procedure:             Colonoscopy Indications:           High risk colon cancer surveillance: Personal history                         of colonic polyps Providers:             Xzaviar Maloof K. Layson Bertsch MD, MD Medicines:             Propofol  per Anesthesia Complications:         No immediate complications. Estimated blood loss:                         Minimal. Procedure:             Pre-Anesthesia Assessment:                        - The risks and benefits of the procedure and the                         sedation options and risks were discussed with the                         patient. All questions were answered and informed                         consent was obtained.                        - Patient identification and proposed procedure were                         verified prior to the procedure by the nurse. The                         procedure was verified in the procedure room.                        - ASA Grade Assessment: III - A patient with severe                         systemic disease.                        - After reviewing the risks and benefits, the patient                         was deemed in satisfactory condition to undergo the                         procedure.                        After obtaining informed consent, the colonoscope was  passed under direct vision. Throughout the procedure,                         the patient's blood pressure, pulse, and oxygen                         saturations were monitored continuously. The                         Colonoscope was introduced through the anus and                         advanced to the the  cecum, identified by appendiceal                         orifice and ileocecal valve. The colonoscopy was                         performed without difficulty. The patient tolerated                         the procedure well. The quality of the bowel                         preparation was adequate. Anatomical landmarks were                         photographed. Findings:      The perianal and digital rectal examinations were normal. Pertinent       negatives include normal sphincter tone and no palpable rectal lesions.      Non-bleeding internal hemorrhoids were found during retroflexion. The       hemorrhoids were Grade I (internal hemorrhoids that do not prolapse).      Many small-mouthed diverticula were found in the sigmoid colon.      A 5 mm polyp was found in the transverse colon. The polyp was sessile.       The polyp was removed with a cold snare. Resection and retrieval were       complete. Estimated blood loss was minimal.      The exam was otherwise without abnormality. Impression:            - Non-bleeding internal hemorrhoids.                        - Diverticulosis in the sigmoid colon.                        - One 5 mm polyp in the transverse colon, removed with                         a cold snare. Resected and retrieved.                        - The examination was otherwise normal. Recommendation:        - Patient has a contact number available for                         emergencies. The signs and symptoms of potential  delayed complications were discussed with the patient.                         Return to normal activities tomorrow. Written                         discharge instructions were provided to the patient.                        - Await pathology results from EGD, also performed                         today.                        - Resume previous diet.                        - Continue present medications.                         - If polyps are benign or adenomatous without                         dysplasia, I will advise NO further colonoscopy due to                         advanced age and/or severe comorbidity.                        - Follow up with Jonette Primmer, PA-C in the GI office.                         [phone]                        - Telephone GI office to schedule appointment in 3                         months.                        - The findings and recommendations were discussed with                         the patient. Procedure Code(s):     --- Professional ---                        [phone], Colonoscopy, flexible; with removal of                         tumor(s), polyp(s), or other lesion(s) by snare                         technique Diagnosis Code(s):     --- Professional ---                        K57.30, Diverticulosis of large intestine without  perforation or abscess without bleeding                        D12.3, Benign neoplasm of transverse colon (hepatic                         flexure or splenic flexure)                        K64.0, First degree hemorrhoids                        Z86.010, Personal history of colonic polyps CPT copyright 2022 American Medical Association. All rights reserved. The codes documented in this report are preliminary and upon coder review may  be revised to meet current compliance requirements. Ladell MARLA Boss MD, MD 11/18/2023 9:44:52 AM This report has been signed electronically. Number of Addenda: 0 Note Initiated On: 11/18/2023 9:01 AM Scope Withdrawal Time: 0 hours 10 minutes 11 seconds  Total Procedure Duration: 0 hours 12 minutes 46 seconds  Estimated Blood Loss:  Estimated blood loss was minimal.      [facility]

## 2023-11-18 NOTE — Interval H&P Note (Signed)
 History and Physical Interval Note:  11/18/2023 9:05 AM  Mario [name]  has presented today for surgery, with the diagnosis of Z86.0101 (ICD-10-CM) - Hx of adenomatous colonic polyps R11.0 (ICD-10-CM) - Chronic nausea K21.9 (ICD-10-CM) - GERD without esophagitis.  The various methods of treatment have been discussed with the patient and family. After consideration of risks, benefits and other options for treatment, the patient has consented to  Procedure(s) with comments: COLONOSCOPY (N/A) - DM on Eliquis  EGD (ESOPHAGOGASTRODUODENOSCOPY) (N/A) as a surgical intervention.  The patient's history has been reviewed, patient examined, no change in status, stable for surgery.  I have reviewed the patient's chart and labs.  Questions were answered to the patient's satisfaction.     [place], Mario [name]

## 2023-11-18 NOTE — Anesthesia Postprocedure Evaluation (Signed)
 Anesthesia Post Note  Patient: Mario [name]  Procedure(s) Performed: COLONOSCOPY EGD (ESOPHAGOGASTRODUODENOSCOPY) POLYPECTOMY, INTESTINE  Patient location during evaluation: Endoscopy Anesthesia Type: General Level of consciousness: awake and alert Pain management: pain level controlled Vital Signs Assessment: post-procedure vital signs reviewed and stable Respiratory status: spontaneous breathing, nonlabored ventilation, respiratory function stable and patient connected to nasal cannula oxygen Cardiovascular status: blood pressure returned to baseline and stable Postop Assessment: no apparent nausea or vomiting Anesthetic complications: no   No notable events documented.   Last Vitals:  Vitals:   11/18/23 0939 11/18/23 0949  BP: (!) 146/55 121/63  Pulse: 78 78  Resp: 20 (!) 27  Temp: (!) 35.7 C   SpO2: 97% 100%    Last Pain:  Vitals:   11/18/23 0939  TempSrc: Temporal  PainSc: 0-No pain                 [place]by [place]

## 2023-11-18 NOTE — Interval H&P Note (Signed)
 History and Physical Interval Note:  11/18/2023 9:03 AM  Mario [name]  has presented today for surgery, with the diagnosis of Z86.0101 (ICD-10-CM) - Hx of adenomatous colonic polyps R11.0 (ICD-10-CM) - Chronic nausea K21.9 (ICD-10-CM) - GERD without esophagitis.  The various methods of treatment have been discussed with the patient and family. After consideration of risks, benefits and other options for treatment, the patient has consented to  Procedure(s) with comments: COLONOSCOPY (N/A) - DM on Eliquis  EGD (ESOPHAGOGASTRODUODENOSCOPY) (N/A) as a surgical intervention.  The patient's history has been reviewed, patient examined, no change in status, stable for surgery.  I have reviewed the patient's chart and labs.  Questions were answered to the patient's satisfaction.     [name], Jaece Ducharme

## 2023-11-18 NOTE — H&P (Signed)
 Outpatient short stay form Pre-procedure 11/18/2023 9:04 AM Mario [name], M.D.  Primary Physician: Mario [name], M.D.   Reason for visit: Chronic abdominal pain  History of present illness:  He has dealt with chronic constipation and chronic abdominal pain for many years. He is currently doing Miralax 17 grams and still only having a BM every 2-4 days. He frequently has to strain in order to get stool to come out. He denies any issues with hematochezia or melena. He does get issues with hemorrhoids from time to time. He can notice bright red blood on the tissue paper every couple months. He continues to have chronic abdominal pain which can be across his bilateral upper abdomen, periumbilical, or in his lower abdominal quadrants. No clear triggers to his abdominal pain. He denies any alleviating factors. After a good BM his abdominal pain is a little bit better. He has struggled with weight loss over the years. He was getting ready to have gastric sleeve surgery in 2023 but this was cancelled after he was diagnosed with atrial fibrillation with RVR. He is s/p ablation for a fib 01/2022. He is on chronic anticoagulation with Eliquis . He denies any history of GI bleeding. He is also noticing chronic UGI symptoms of nausea without vomiting, dyspepsia characterized by postprandial bloating, and indigestion symptoms. He is not on any daily acid suppression therapy. He denies any complaints of solid food or liquid dysphagia. He denies any complaints of odynophagia, early satiety, or hoarseness. He has some questions about if he has true cirrhosis or not. He had imaging done which commented on possible cirrhosis and other imaging that said he does not have cirrhosis.      Current Facility-Administered Medications:    0.9 %  sodium chloride  infusion, , Intravenous, Continuous, Mario [name], Mario Birdsall K, MD, Last Rate: 20 mL/hr at 11/18/23 0901, 20 mL/hr at 11/18/23 0901  Medications Prior to Admission   Medication Sig Dispense Refill Last Dose/Taking   Camphor-Eucalyptus-Menthol  (VICKS VAPORUB EX) Place 1 application into both nostrils at bedtime as needed (congestion).    Past Week   diltiazem  (TIAZAC ) 120 MG 24 hr capsule Take 120 mg by mouth daily.   Past Week   ELIQUIS  5 MG TABS tablet Take 5 mg by mouth 2 (two) times daily.   Past Week   flecainide  (TAMBOCOR ) 100 MG tablet Take 100 mg by mouth 2 (two) times daily.   Past Week   gabapentin  (NEURONTIN ) 300 MG capsule Take 300 mg by mouth 3 (three) times daily.   Past Week   glimepiride  (AMARYL ) 4 MG tablet Take 4 mg by mouth daily with breakfast.    Past Week   losartan  (COZAAR ) 25 MG tablet Take 25 mg by mouth daily.   Past Week   metFORMIN  (GLUCOPHAGE ) 1000 MG tablet Take 1,000 mg by mouth 2 (two) times daily with a meal.   Past Week   metoprolol  tartrate (LOPRESSOR ) 100 MG tablet Take 1 tablet (100mg ) TWO hours prior to CT scan 1 tablet 0 Past Week   tiZANidine  (ZANAFLEX ) 4 MG tablet Take 4 mg by mouth every 8 (eight) hours as needed for muscle spasms.    Past Week   traZODone  (DESYREL ) 100 MG tablet Take 100 mg by mouth at bedtime.   Past Week     Allergies  Allergen Reactions   Levaquin [Levofloxacin] Itching    Red lines     Past Medical History:  Diagnosis Date   Anginal pain (HCC)    Arthritis  Back pain    lower   Chronic post-traumatic stress disorder (PTSD)    Cirrhosis (HCC)    Claustrophobia    Claustrophobia    Complication of anesthesia    does not take much medication.   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    Physical Exam  Gen: Alert, oriented. Appears stated age.  HEENT: Mario [name]/AT. PERRLA. Lungs: CTA, no wheezes. CV: RR nl S1, S2. Abd: soft, benign, no masses. BS+ Ext: No edema. Pulses 2+    Planned procedures: Proceed with EGD. The patient understands the nature of the planned procedure, indications, risks, alternatives and potential complications including but not limited to bleeding, infection, perforation, damage to internal organs and possible oversedation/side effects from anesthesia. The patient agrees and gives consent to proceed.  Please refer to procedure notes for findings, recommendations and patient disposition/instructions.     Jaevian Shean [name]. [name], M.D. Gastroenterology 11/18/2023  9:04 AM

## 2023-11-18 NOTE — H&P (Signed)
 Outpatient short stay form Pre-procedure 11/18/2023 9:03 AM Chailyn Racette K. Aundria, M.D.  Primary Physician: Norleen Rower, M.D.  Reason for visit:  Hx of adenomatous colon polyps  History of present illness:  73 y/o Caucasian male with a PMH of morbid obesity (BMI 42.18), HTN, HLD, OSA on CPAP, T2DM, PAF s/p ablation 01/2022 on chronic anticoagulation, chronic pain syndrome, OA, anxiety, and depression presents to the [facility] to re-establish care  Chronic abdominal pain/Chronic constipation - c/w IBS-C  Abdominal bloating  Chronic nausea  GERD without esophagitis  Hx of adenomatous colon polyps  Fatty liver - c/w MASLD. Previous imaging in 2020 raised question of possible cirrhosis. However, updated imaging has not shown morphological features of cirrhosis. He has normal LFTs and normal platelet count.      Current Facility-Administered Medications:    0.9 %  sodium chloride  infusion, , Intravenous, Continuous, [place], Douglas Smolinsky K, MD, Last Rate: 20 mL/hr at 11/18/23 0901, 20 mL/hr at 11/18/23 0901  Medications Prior to Admission  Medication Sig Dispense Refill Last Dose/Taking   Camphor-Eucalyptus-Menthol  (VICKS VAPORUB EX) Place 1 application into both nostrils at bedtime as needed (congestion).    Past Week   diltiazem  (TIAZAC ) 120 MG 24 hr capsule Take 120 mg by mouth daily.   Past Week   ELIQUIS  5 MG TABS tablet Take 5 mg by mouth 2 (two) times daily.   Past Week   flecainide  (TAMBOCOR ) 100 MG tablet Take 100 mg by mouth 2 (two) times daily.   Past Week   gabapentin  (NEURONTIN ) 300 MG capsule Take 300 mg by mouth 3 (three) times daily.   Past Week   glimepiride  (AMARYL ) 4 MG tablet Take 4 mg by mouth daily with breakfast.    Past Week   losartan  (COZAAR ) 25 MG tablet Take 25 mg by mouth daily.   Past Week   metFORMIN  (GLUCOPHAGE ) 1000 MG tablet Take 1,000 mg by mouth 2 (two) times daily with a meal.   Past Week   metoprolol  tartrate (LOPRESSOR ) 100 MG tablet Take 1  tablet (100mg ) TWO hours prior to CT scan 1 tablet 0 Past Week   tiZANidine  (ZANAFLEX ) 4 MG tablet Take 4 mg by mouth every 8 (eight) hours as needed for muscle spasms.    Past Week   traZODone  (DESYREL ) 100 MG tablet Take 100 mg by mouth at bedtime.   Past Week     Allergies  Allergen Reactions   Levaquin [Levofloxacin] Itching    Red lines     Past Medical History:  Diagnosis Date   Anginal pain (HCC)    Arthritis    Back pain    lower   Chronic post-traumatic stress disorder (PTSD)    Cirrhosis (HCC)    Claustrophobia    Claustrophobia    Complication of anesthesia    does not take much medication.   Depression    Diabetes mellitus without complication (HCC)    type II   Dyspnea    Dysrhythmia    Elevated lipids    Elevated lipids    Environmental and seasonal allergies    Fatty liver    GERD (gastroesophageal reflux disease)    Glaucoma    Hepatitis    type A   HH (hiatus hernia)    Hip pain Right   History of kidney stones    cystoscopy   History of rheumatoid arthritis    Knee pain    right   OSA on CPAP    Panic attack  due to post traumatic stress disorder (PTSD)    Panic attacks    Paroxysmal atrial fibrillation (HCC)    Pneumonia    as a child   Sleep apnea    CPAP   Vertigo     Review of systems:  Otherwise negative.    Physical Exam  Gen: Alert, oriented. Appears stated age.  HEENT: [place]/AT. PERRLA. Lungs: CTA, no wheezes. CV: RR nl S1, S2. Abd: soft, benign, no masses. BS+ Ext: No edema. Pulses 2+    Planned procedures: Proceed with colonoscopy. The patient understands the nature of the planned procedure, indications, risks, alternatives and potential complications including but not limited to bleeding, infection, perforation, damage to internal organs and possible oversedation/side effects from anesthesia. The patient agrees and gives consent to proceed.  Please refer to procedure notes for findings, recommendations and patient  disposition/instructions.     Giuseppe Duchemin K. Aundria, M.D. Gastroenterology 11/18/2023  9:03 AM
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
## 2023-11-18 NOTE — Op Note (Signed)
 [facility] Gastroenterology Patient Name: Mario [name] Procedure Date: 11/18/2023 9:01 AM MRN: [id number] Account #: [id number] Date of Birth: 20-Apr-1950 Admit Type: Outpatient Age: 73 Room: [facility] ENDO ROOM 2 Gender: Male Note Status: Finalized Instrument Name: Upper GI Scope [id number] Procedure:             Upper GI endoscopy Indications:           Epigastric abdominal pain, Functional Dyspepsia,                         Heartburn Providers:             Dhani Dannemiller K. Armstead Heiland MD, MD Medicines:             Propofol  per Anesthesia Complications:         No immediate complications. Estimated blood loss:                         Minimal. Procedure:             Pre-Anesthesia Assessment:                        - The risks and benefits of the procedure and the                         sedation options and risks were discussed with the                         patient. All questions were answered and informed                         consent was obtained.                        - Patient identification and proposed procedure were                         verified prior to the procedure by the nurse. The                         procedure was verified in the procedure room.                        - ASA Grade Assessment: III - A patient with severe                         systemic disease.                        - After reviewing the risks and benefits, the patient                         was deemed in satisfactory condition to undergo the                         procedure.                        After obtaining informed consent, the endoscope was  passed under direct vision. Throughout the procedure,                         the patient's blood pressure, pulse, and oxygen                         saturations were monitored continuously. The Endoscope                         was introduced through the mouth, and advanced to the                         third part  of duodenum. The upper GI endoscopy was                         accomplished without difficulty. The patient tolerated                         the procedure well. Findings:      The esophagus was normal.      Scattered mild inflammation characterized by erosions and erythema was       found in the gastric antrum. Biopsies were taken with a cold forceps for       Helicobacter pylori testing.      The cardia and gastric fundus were normal on retroflexion.      The examined duodenum was normal. Impression:            - Normal esophagus.                        - Gastritis. Biopsied.                        - Normal examined duodenum. Recommendation:        - Await pathology results.                        - Proceed with colonoscopy Procedure Code(s):     --- Professional ---                        [phone], Esophagogastroduodenoscopy, flexible,                         transoral; with biopsy, single or multiple Diagnosis Code(s):     --- Professional ---                        R12, Heartburn                        K30, Functional dyspepsia                        R10.13, Epigastric pain                        K29.70, Gastritis, unspecified, without bleeding CPT copyright 2022 American [employer] Association. All rights reserved. The codes documented in this report are preliminary and upon coder review may  be revised to meet current compliance requirements. Ladell MARLA Boss MD, MD 11/18/2023 9:22:13 AM This report has been signed electronically. Number of  Addenda: 0 Note Initiated On: 11/18/2023 9:01 AM Estimated Blood Loss:  Estimated blood loss was minimal.      [facility]

## 2023-11-18 NOTE — Transfer of Care (Signed)
 Immediate Anesthesia Transfer of Care Note  Patient: Baer Hinton  Procedure(s) Performed: COLONOSCOPY EGD (ESOPHAGOGASTRODUODENOSCOPY) POLYPECTOMY, INTESTINE  Patient Location: PACU  Anesthesia Type:MAC  Level of Consciousness: awake and alert   Airway & Oxygen Therapy: Patient Spontanous Breathing and Patient connected to nasal cannula oxygen  Post-op Assessment: Report given to RN and Post -op Vital signs reviewed and stable  Post vital signs: Reviewed and stable  Last Vitals:  Vitals Value Taken Time  BP 148/65   Temp    Pulse 67   Resp 16   SpO2 98     Last Pain:  Vitals:   11/18/23 0838  TempSrc: Temporal  PainSc: 0-No pain         Complications: No notable events documented.

## 2023-11-19 LAB — SURGICAL PATHOLOGY

## 2023-11-26 ENCOUNTER — Encounter: Payer: Self-pay | Admitting: Dermatology

## 2023-11-26 ENCOUNTER — Ambulatory Visit: Admitting: Dermatology

## 2023-11-26 DIAGNOSIS — L820 Inflamed seborrheic keratosis: Secondary | ICD-10-CM | POA: Diagnosis not present

## 2023-11-26 DIAGNOSIS — L578 Other skin changes due to chronic exposure to nonionizing radiation: Secondary | ICD-10-CM

## 2023-11-26 DIAGNOSIS — L821 Other seborrheic keratosis: Secondary | ICD-10-CM | POA: Diagnosis not present

## 2023-11-26 DIAGNOSIS — W908XXA Exposure to other nonionizing radiation, initial encounter: Secondary | ICD-10-CM | POA: Diagnosis not present

## 2023-11-26 DIAGNOSIS — L603 Nail dystrophy: Secondary | ICD-10-CM | POA: Diagnosis not present

## 2023-11-26 NOTE — Patient Instructions (Addendum)
 Seborrheic Keratosis  What causes seborrheic keratoses? Seborrheic keratoses are harmless, common skin growths that first appear during adult life.  As time goes by, more growths appear.  Some people may develop a large number of them.  Seborrheic keratoses appear on both covered and uncovered body parts.  They are not caused by sunlight.  The tendency to develop seborrheic keratoses can be inherited.  They vary in color from skin-colored to gray, brown, or even black.  They can be either smooth or have a rough, warty surface.   Seborrheic keratoses are superficial and look as if they were stuck on the skin.  Under the microscope this type of keratosis looks like layers upon layers of skin.  That is why at times the top layer may seem to fall off, but the rest of the growth remains and re-grows.    Treatment Seborrheic keratoses do not need to be treated, but can easily be removed in the office.  Seborrheic keratoses often cause symptoms when they rub on clothing or jewelry.  Lesions can be in the way of shaving.  If they become inflamed, they can cause itching, soreness, or burning.  Removal of a seborrheic keratosis can be accomplished by freezing, burning, or surgery. If any spot bleeds, scabs, or grows rapidly, please return to have it checked, as these can be an indication of a skin cancer.  Recommend daily broad spectrum sunscreen SPF 30+ to sun-exposed areas, reapply every 2 hours as needed. Call for new or changing lesions.  Staying in the shade or wearing long sleeves, sun glasses (UVA+UVB protection) and wide brim hats (4-inch brim around the entire circumference of the hat) are also recommended for sun protection.   Cryotherapy Aftercare  Wash gently with soap and water everyday.   Apply Vaseline and Band-Aid daily until healed.   Due to recent changes in healthcare laws, you may see results of your pathology and/or laboratory studies on MyChart before the doctors have had a chance to  review them. We understand that in some cases there may be results that are confusing or concerning to you. Please understand that not all results are received at the same time and often the doctors may need to interpret multiple results in order to provide you with the best plan of care or course of treatment. Therefore, we ask that you please give us  2 business days to thoroughly review all your results before contacting the office for clarification. Should we see a critical lab result, you will be contacted sooner.   If You Need Anything After Your Visit  If you have any questions or concerns for your doctor, please call our main line at [phone] and press option 4 to reach your doctor's medical assistant. If no one answers, please leave a voicemail as directed and we will return your call as soon as possible. Messages left after 4 pm will be answered the following business day.   You may also send us  a message via MyChart. We typically respond to MyChart messages within 1-2 business days.  For prescription refills, please ask your pharmacy to contact our office. Our fax number is [phone].  If you have an urgent issue when the clinic is closed that cannot wait until the next business day, you can page your doctor at the number below.    Please note that while we do our best to be available for urgent issues outside of office hours, we are not available 24/7.   If you  have an urgent issue and are unable to reach us , you may choose to seek medical care at your doctor's office, retail clinic, urgent care center, or emergency room.  If you have a medical emergency, please immediately call 911 or go to the emergency department.  Pager Numbers  - Dr. Hester: [phone]  - Dr. Jackquline: [phone]  - Dr. Claudene: [phone]   - Dr. Raymund: [phone]  In the event of inclement weather, please call our main line at [phone] for an update on the status of any delays or  closures.  Dermatology Medication Tips: Please keep the boxes that topical medications come in in order to help keep track of the instructions about where and how to use these. Pharmacies typically print the medication instructions only on the boxes and not directly on the medication tubes.   If your medication is too expensive, please contact our office at [phone] option 4 or send us  a message through MyChart.   We are unable to tell what your co-pay for medications will be in advance as this is different depending on your insurance coverage. However, we may be able to find a substitute medication at lower cost or fill out paperwork to get insurance to cover a needed medication.   If a prior authorization is required to get your medication covered by your insurance company, please allow us  1-2 business days to complete this process.  Drug prices often vary depending on where the prescription is filled and some pharmacies may offer cheaper prices.  The website www.goodrx.com contains coupons for medications through different pharmacies. The prices here do not account for what the cost may be with help from insurance (it may be cheaper with your insurance), but the website can give you the price if you did not use any insurance.  - You can print the associated coupon and take it with your prescription to the pharmacy.  - You may also stop by our office during regular business hours and pick up a GoodRx coupon card.  - If you need your prescription sent electronically to a different pharmacy, notify our office through [facility] or by phone at [phone] option 4.     Si Usted Necesita Algo Despus de Su Visita  Tambin puede enviarnos un mensaje a travs de Clinical cytogeneticist. Por lo general respondemos a los mensajes de MyChart en el transcurso de 1 a 2 das hbiles.  Para renovar recetas, por favor pida a su farmacia que se ponga en contacto con nuestra oficina. Randi lakes de fax  es [place] [phone].  Si tiene un asunto urgente cuando la clnica est cerrada y que no puede esperar hasta el siguiente da hbil, puede llamar/localizar a su doctor(a) al nmero que aparece a continuacin.   Por favor, tenga en cuenta que aunque hacemos todo lo posible para estar disponibles para asuntos urgentes fuera del horario de [place], no estamos disponibles las 24 horas del da, los 7 [address]  [address] de la [place].   Si tiene un problema urgente y no puede comunicarse con nosotros, puede optar por buscar atencin mdica  en el consultorio de su doctor(a), en una clnica privada, en un centro de atencin urgente o en una sala de emergencias.  Si tiene Engineer, drilling, por favor llame inmediatamente al 911 o vaya a la sala de emergencias.  Nmeros de bper  - Dr. Hester: [phone]  - Dra. Jackquline: [phone]  - Dr. Claudene: [phone]  - Dra. Kitts: [phone]  En caso de inclemencias  del [place], por favor llame a landry lnea principal al [phone] para una actualizacin sobre el estado de cualquier retraso o cierre.  Consejos para la medicacin en dermatologa: Por favor, guarde las cajas en las que vienen los medicamentos de uso tpico para ayudarle a seguir las instrucciones sobre dnde y cmo usarlos. Las farmacias generalmente imprimen las instrucciones del medicamento slo en las cajas y no directamente en los tubos del [place].   Si su medicamento es muy caro, por favor, pngase en contacto con landry rieger llamando al [phone] y presione la opcin 4 o envenos un mensaje a travs de Clinical cytogeneticist.   No podemos decirle cul ser su copago por los medicamentos por adelantado ya que esto es diferente dependiendo de la cobertura de su seguro. Sin embargo, es posible que podamos encontrar un medicamento sustituto a Audiological scientist un formulario para que el seguro cubra el medicamento que se considera necesario.   Si se requiere una autorizacin previa para  que su compaa de seguros malta su medicamento, por favor permtanos de 1 a 2 das hbiles para completar este proceso.  Los precios de los medicamentos varan con frecuencia dependiendo del Environmental consultant de dnde se surte la receta y alguna farmacias pueden ofrecer precios ms baratos.  El sitio web www.goodrx.com tiene cupones para medicamentos de Health and safety inspector. Los precios aqu no tienen en cuenta lo que podra costar con la ayuda del seguro (puede ser ms barato con su seguro), pero el sitio web puede darle el precio si no utiliz Tourist information centre manager.  - Puede imprimir el cupn correspondiente y llevarlo con su receta a la farmacia.  - Tambin puede pasar por nuestra oficina durante el horario de atencin regular y Education officer, museum una tarjeta de cupones de GoodRx.  - Si necesita que su receta se enve electrnicamente a una farmacia diferente, informe a nuestra oficina a travs de MyChart de [place] o por telfono llamando al [phone] y presione la opcin 4.

## 2023-11-26 NOTE — Progress Notes (Signed)
   Follow-Up Visit   Subjective  Travon Crochet is a 73 y.o. male who presents for the following: check spots back that wife noticed itchy prn, growth R med canthus 19m, growing, itchng L face when he gets hot, TMC 0.5% cr ~every other day,  check spot abdomen, and umbilicus, check fingernails, get thick and brittle and come off ~10 months   The following portions of the chart were reviewed this encounter and updated as appropriate: medications, allergies, medical history  Review of Systems:  No other skin or systemic complaints except as noted in HPI or Assessment and Plan.  Objective  Well appearing patient in no apparent distress; mood and affect are within normal limits.   A focused examination was performed of the following areas: Abdomen, face, back  Relevant exam findings are noted in the Assessment and Plan.  L abdomen x 1 Stuck on waxy paps with erythema  Assessment & Plan   SEBORRHEIC KERATOSIS Face, back, abdomen - Stuck-on, waxy, tan-brown papules and/or plaques  - Benign-appearing - Discussed benign etiology and prognosis. - Observe - Call for any changes  ACTINIC DAMAGE - chronic, secondary to cumulative UV radiation exposure/sun exposure over time - diffuse scaly erythematous macules with underlying dyspigmentation - Recommend daily broad spectrum sunscreen SPF 30+ to sun-exposed areas, reapply every 2 hours as needed.  - Recommend staying in the shade or wearing long sleeves, sun glasses (UVA+UVB protection) and wide brim hats (4-inch brim around the entire circumference of the hat). - Call for new or changing lesions.   NAIL PROBLEM Trachyonichia fingernails Exam: shortening, thinning and longitudinal separation of all fingernails to varying degrees. Hemorrhagic crusting on some suggestive of trauma  Treatment Plan: Recmmend letting nails grow out and we can do fungal culture to r/o fungus, would need oral antifungal to treat with risk of liver injury,  patient defers INFLAMED SEBORRHEIC KERATOSIS L abdomen x 1 Symptomatic, irritating, patient would like treated. Destruction of lesion - L abdomen x 1 Complexity: simple   Destruction method: cryotherapy   Informed consent: discussed and consent obtained   Timeout:  patient name, date of birth, surgical site, and procedure verified Lesion destroyed using liquid nitrogen: Yes   Region frozen until ice ball extended beyond lesion: Yes   Cryo cycles: 1 or 2. Outcome: patient tolerated procedure well with no complications   Post-procedure details: wound care instructions given    SEBORRHEIC KERATOSES   TRACHYONYCHIA    Return if symptoms worsen or fail to improve.  I, Grayce Saunas, RMA, am acting as scribe for Boneta Sharps, MD .   Documentation: I have reviewed the above documentation for accuracy and completeness, and I agree with the above.  Boneta Sharps, MD
